# Patient Record
Sex: Female | Born: 1975 | Race: White | Hispanic: No | Marital: Single | Smoking: Never smoker
Health system: Southern US, Community
[De-identification: ages and names within clinical notes are randomized; demographics above are authoritative.]

## PROBLEM LIST (undated history)

## (undated) DIAGNOSIS — R161 Splenomegaly, not elsewhere classified: Secondary | ICD-10-CM

## (undated) DIAGNOSIS — J45909 Unspecified asthma, uncomplicated: Secondary | ICD-10-CM

## (undated) DIAGNOSIS — B259 Cytomegaloviral disease, unspecified: Secondary | ICD-10-CM

## (undated) HISTORY — DX: Splenomegaly, not elsewhere classified: R16.1

## (undated) HISTORY — PX: DILATION AND CURETTAGE OF UTERUS: SHX78

## (undated) HISTORY — DX: Unspecified asthma, uncomplicated: J45.909

## (undated) HISTORY — PX: FOOT SURGERY: SHX648

## (undated) HISTORY — DX: Cytomegaloviral disease, unspecified: B25.9

## (undated) HISTORY — PX: OTHER SURGICAL HISTORY: SHX169

---

## 2007-04-22 ENCOUNTER — Emergency Department (HOSPITAL_COMMUNITY): Admission: EM | Admit: 2007-04-22 | Discharge: 2007-04-22 | Payer: Self-pay | Admitting: Family Medicine

## 2009-03-20 ENCOUNTER — Ambulatory Visit (HOSPITAL_COMMUNITY): Admission: RE | Admit: 2009-03-20 | Discharge: 2009-03-20 | Payer: Self-pay | Admitting: Obstetrics and Gynecology

## 2009-04-15 ENCOUNTER — Emergency Department (HOSPITAL_COMMUNITY): Admission: EM | Admit: 2009-04-15 | Discharge: 2009-04-15 | Payer: Self-pay | Admitting: Emergency Medicine

## 2016-11-12 ENCOUNTER — Encounter: Payer: Self-pay | Admitting: Family Medicine

## 2016-11-12 ENCOUNTER — Ambulatory Visit (INDEPENDENT_AMBULATORY_CARE_PROVIDER_SITE_OTHER): Payer: Managed Care, Other (non HMO) | Admitting: Family Medicine

## 2016-11-12 VITALS — BP 120/80 | HR 76 | Temp 98.3°F | Resp 16 | Wt 156.6 lb

## 2016-11-12 DIAGNOSIS — J4520 Mild intermittent asthma, uncomplicated: Secondary | ICD-10-CM | POA: Diagnosis not present

## 2016-11-12 DIAGNOSIS — R05 Cough: Secondary | ICD-10-CM

## 2016-11-12 DIAGNOSIS — J45909 Unspecified asthma, uncomplicated: Secondary | ICD-10-CM | POA: Insufficient documentation

## 2016-11-12 DIAGNOSIS — Z7689 Persons encountering health services in other specified circumstances: Secondary | ICD-10-CM

## 2016-11-12 DIAGNOSIS — R059 Cough, unspecified: Secondary | ICD-10-CM

## 2016-11-12 MED ORDER — AZITHROMYCIN 250 MG PO TABS: ORAL_TABLET | ORAL | 0 refills | Status: DC

## 2016-11-12 NOTE — Progress Notes (Signed)
   Subjective:    Patient ID: Ariel Ayala, female    DOB: 01-21-1976, 40 y.o.   MRN: [id number]  HPI Chief Complaint  Patient presents with  . sick    cough, congestion- ear fullness, using inhaler. daughter was sick from cruise    She is new to the practice and here with complaints of a 5 day history of rhinorrhea, nasal congestion, ear fullness, and dry cough. Reports yesterday she was worse with wheezing. History of asthma and was using albuterol yesterday. Asthma is well controlled typically. Denies smoking. No history of pneumonia, chronic bronchitis.  Daughter also sick with URI.  Recent cruise to [place] and returned 1 day prior to onset of symptoms.   Denies fever, chills, body aches, fatigue, sore throat, wheezing, chest pain, palpitations, abdominal pain, N/V/D. No LE edema.   Is taking sudafed and albuterol inhaler.  Did not get a flu shot.   Other providers: OB/GYN- Landis MartinsKaren Curtis.   E-sure in 2010.  Single. One child.   Works as a Teacher, English as a foreign languageresearch manager.   Reviewed allergies, medications, past medical, surgical, and social history.     Review of Systems Pertinent positives and negatives in the history of present illness.     Objective:   Physical Exam  Constitutional: She is oriented to person, place, and time. She appears well-developed and well-nourished. No distress.  HENT:  Right Ear: A middle ear effusion is present.  Left Ear: Ear canal normal. A middle ear effusion is present.  Nose: Mucosal edema and rhinorrhea present. Right sinus exhibits no maxillary sinus tenderness and no frontal sinus tenderness. Left sinus exhibits no maxillary sinus tenderness and no frontal sinus tenderness.  Mouth/Throat: Uvula is midline, oropharynx is clear and moist and mucous membranes are normal.  Eyes: Conjunctivae are normal. Pupils are equal, round, and reactive to light.  Neck: Full passive range of motion without pain. Neck supple.  Cardiovascular: Normal rate, regular  rhythm and normal heart sounds.   Pulmonary/Chest: Effort normal and breath sounds normal. No accessory muscle usage. She has no wheezes.  Lymphadenopathy:    She has no cervical adenopathy.  Neurological: She is alert and oriented to person, place, and time.  Skin: Skin is warm and dry. No pallor.  Psychiatric: She has a normal mood and affect. Her speech is normal and behavior is normal. Thought content normal.   BP 120/80   Pulse 76   Temp 98.3 F (36.8 C) (Oral)   Resp 16   Wt 156 lb 9.6 oz (71 kg)   LMP 10/27/2016   SpO2 97%       Assessment & Plan:  Cough  Mild intermittent asthma without complication  Encounter to establish care  Discussed that her symptoms are most likely related to viral etiology and recommend symptomatic treatment such as staying hydrated, mucinex DM or Robitussin DM.  Z-Pak prescribed and advised her to give her symptoms 2-3 more days to see if she turns the corner on her own. If she worsens or is not improving she will start the antibiotic. She will follow up if not back to baseline after day 10 if she takes the antibiotic.

## 2016-11-15 ENCOUNTER — Telehealth: Payer: Self-pay | Admitting: Family Medicine

## 2016-11-15 NOTE — Telephone Encounter (Signed)
Pt had not started zpak yet. Advised to start that as its for coughing and wheezing and call us back on tuesdya if not better

## 2016-11-15 NOTE — Telephone Encounter (Signed)
Please call patient  Patient not feeling better, told to call if Rx needed Using inhaler a lot more, wheezing , coughing    [facility]

## 2016-11-15 NOTE — Telephone Encounter (Signed)
Please let her know that I sent the antibiotic prescription to her pharmacy.

## 2017-04-10 ENCOUNTER — Encounter: Payer: Self-pay | Admitting: Family Medicine

## 2017-04-10 ENCOUNTER — Other Ambulatory Visit: Payer: Self-pay | Admitting: Family Medicine

## 2017-04-10 ENCOUNTER — Ambulatory Visit (INDEPENDENT_AMBULATORY_CARE_PROVIDER_SITE_OTHER): Payer: Managed Care, Other (non HMO) | Admitting: Family Medicine

## 2017-04-10 VITALS — BP 110/70 | HR 92 | Temp 98.8°F | Wt 158.2 lb

## 2017-04-10 DIAGNOSIS — J4520 Mild intermittent asthma, uncomplicated: Secondary | ICD-10-CM | POA: Diagnosis not present

## 2017-04-10 DIAGNOSIS — R140 Abdominal distension (gaseous): Secondary | ICD-10-CM | POA: Diagnosis not present

## 2017-04-10 DIAGNOSIS — R52 Pain, unspecified: Secondary | ICD-10-CM | POA: Diagnosis not present

## 2017-04-10 LAB — CBC WITH DIFFERENTIAL/PLATELET
BASOS ABS: 222 {cells}/uL — AB (ref 0–200)
Basophils Relative: 3 %
EOS ABS: 148 {cells}/uL (ref 15–500)
EOS PCT: 2 %
HEMATOCRIT: 37.2 % (ref 35.0–45.0)
HEMOGLOBIN: 12.3 g/dL (ref 11.7–15.5)
LYMPHS ABS: 3108 {cells}/uL (ref 850–3900)
Lymphocytes Relative: 42 %
MCH: 27.2 pg (ref 27.0–33.0)
MCHC: 33.1 g/dL (ref 32.0–36.0)
MCV: 82.1 fL (ref 80.0–100.0)
MONO ABS: 666 {cells}/uL (ref 200–950)
MPV: 10.3 fL (ref 7.5–12.5)
Monocytes Relative: 9 %
NEUTROS ABS: 3256 {cells}/uL (ref 1500–7800)
Neutrophils Relative %: 44 %
Platelets: 250 10*3/uL (ref 140–400)
RBC: 4.53 MIL/uL (ref 3.80–5.10)
RDW: 14.1 % (ref 11.0–15.0)
WBC: 7.4 10*3/uL (ref 4.0–10.5)

## 2017-04-10 LAB — POCT URINALYSIS DIPSTICK
Bilirubin, UA: NEGATIVE
Glucose, UA: NEGATIVE
Ketones, UA: NEGATIVE
Leukocytes, UA: NEGATIVE
Nitrite, UA: NEGATIVE
SPEC GRAV UA: 1.01 (ref 1.010–1.025)
UROBILINOGEN UA: NEGATIVE U/dL — AB
pH, UA: 5 (ref 5.0–8.0)

## 2017-04-10 LAB — COMPREHENSIVE METABOLIC PANEL
ALBUMIN: 3.7 g/dL (ref 3.6–5.1)
ALT: 37 U/L — ABNORMAL HIGH (ref 6–29)
AST: 42 U/L — ABNORMAL HIGH (ref 10–30)
Alkaline Phosphatase: 101 U/L (ref 33–115)
BILIRUBIN TOTAL: 0.8 mg/dL (ref 0.2–1.2)
BUN: 8 mg/dL (ref 7–25)
CALCIUM: 8.6 mg/dL (ref 8.6–10.2)
CHLORIDE: 99 mmol/L (ref 98–110)
CO2: 25 mmol/L (ref 20–31)
CREATININE: 0.84 mg/dL (ref 0.50–1.10)
Glucose, Bld: 103 mg/dL — ABNORMAL HIGH (ref 65–99)
Potassium: 3.7 mmol/L (ref 3.5–5.3)
SODIUM: 138 mmol/L (ref 135–146)
TOTAL PROTEIN: 6.7 g/dL (ref 6.1–8.1)

## 2017-04-10 MED ORDER — ALBUTEROL SULFATE HFA 108 (90 BASE) MCG/ACT IN AERS: 2.0000 | INHALATION_SPRAY | Freq: Four times a day (QID) | RESPIRATORY_TRACT | 1 refills | Status: DC | PRN

## 2017-04-10 NOTE — Progress Notes (Signed)
Subjective:    Patient ID: Ariel Ayala, female    DOB: 28-Sep-1976, 41 y.o.   MRN: [id number]  HPI Chief Complaint  Patient presents with  . not feeling well    last week, felt bloated, gassy, got gas x and did fine but still bloated and appetite hasn't come back. body temperature is off.   She is here with complaints of a 10 day history of her abdomen feeling bloated and gassy. Reports mainly belching and not flatulence. States symptoms are gradually improving.  She also reports body aches for the past week with some fluctuations in her body temperature but no fever. Complains of decreased appetite but denies weight loss. States she is eating small amounts of food and this does not make her symptoms better or worse. She does recall having similar issue in the past but it spontaneously resolved.  States she tried Gas-X and ginger tea with some relief.    Reports having bowel movements every other day and no changes in bowel habits.  No blood or pus in stool.  No personal or family history of colon cancer or UC.   She is a vegetarian   Drinks soda and carbonated beverages and has cut back to 2-3 per day.   States her body aches, specifically her low back pain feels "achy".  Denies rash, dizziness, chest pain, palpitations, indigestion, cough, shortness of breath, abdominal pain, nausea, vomiting, urinary symptoms, vaginal discharge or dyspareunia.   No daily medications.  Needs albuterol inhaler refilled. Has seasonal allergies and uses her inhaler once a month usually. Asthma well controlled.   No recent surgery, antibiotics or travel.   LMP: 2 weeks ago.  Had E-sure in 2010   Reviewed allergies, medications, past medical, surgical, family, and social history.    Review of Systems Pertinent positives and negatives in the history of present illness.     Objective:   Physical Exam  Constitutional: She is oriented to person, place, and time. She appears well-developed and  well-nourished. She does not have a sickly appearance. No distress.  HENT:  Mouth/Throat: Uvula is midline, oropharynx is clear and moist and mucous membranes are normal.  Eyes: Conjunctivae and lids are normal. Pupils are equal, round, and reactive to light.  Neck: Full passive range of motion without pain. Neck supple. No tracheal tenderness present.  Cardiovascular: Normal rate, regular rhythm and normal heart sounds.   Pulmonary/Chest: Effort normal and breath sounds normal.  Abdominal: Soft. Normal appearance and bowel sounds are normal. There is no hepatosplenomegaly. There is no tenderness. There is no rigidity, no rebound, no guarding, no CVA tenderness, no tenderness at McBurney's point and negative Murphy's sign.  Lymphadenopathy:    She has no cervical adenopathy.  Neurological: She is oriented to person, place, and time. She has normal strength. Gait normal.  Skin: Skin is warm and dry. No rash noted. She is not diaphoretic. No pallor.  Psychiatric: She has a normal mood and affect. Her speech is normal and behavior is normal. Thought content normal.   BP 110/70   Pulse 92   Temp 98.8 F (37.1 C) (Oral)   Wt 158 lb 3.2 oz (71.8 kg)   SpO2 97%       Assessment & Plan:  Abdominal bloating - Plan: CBC with Differential/Platelet, Comprehensive metabolic panel, POCT urinalysis dipstick  Body aches  Mild intermittent asthma without complication  Discussed that there is no sign of infection, her exam is unremarkable and her symptoms seem  to be improving. This all speaks to this not being anything serious. Advised her to try cutting back on foods that are gas producing and keep a journal of her foods and symptoms. Handout given on Low FODMAP diet.  She may try Beano or simethicone.  She will let me know if she notices any new symptoms and discussed that sometimes if one new symptoms develops then this may help us figure out the underlying etiology.  Refilled albuterol inhaler,  asthma is well controlled.  Will have her follow up in 2-3 weeks or sooner if needed.

## 2017-04-10 NOTE — Patient Instructions (Signed)
Try paying close attention to your diet and see if certain foods make your symptoms worse or better.  If you develop new symptoms such as fever, abdominal pain, constipation or diarrhea then let us know.   You can try Beano or Simethicone over the counter and see if this helps. Cut back on high gas producing foods such as cabbage, beans You may also want to see if avoiding dairy or gluten makes a difference.   Let's follow up in 2 weeks and see how you are doing or sooner if needed.

## 2017-04-11 ENCOUNTER — Other Ambulatory Visit: Payer: Self-pay | Admitting: Family Medicine

## 2017-04-11 DIAGNOSIS — R945 Abnormal results of liver function studies: Principal | ICD-10-CM

## 2017-04-11 DIAGNOSIS — R7989 Other specified abnormal findings of blood chemistry: Secondary | ICD-10-CM

## 2017-04-12 LAB — HEPATITIS PANEL, ACUTE
HCV AB: NEGATIVE
HEP A IGM: NONREACTIVE
HEP B C IGM: NONREACTIVE
HEP B S AG: NEGATIVE

## 2017-04-13 ENCOUNTER — Emergency Department (HOSPITAL_COMMUNITY)
Admission: EM | Admit: 2017-04-13 | Discharge: 2017-04-13 | Disposition: A | Discharge status: Home / Self Care | Payer: Managed Care, Other (non HMO) | Attending: Emergency Medicine | Admitting: Emergency Medicine

## 2017-04-13 ENCOUNTER — Emergency Department (HOSPITAL_COMMUNITY): Payer: Managed Care, Other (non HMO)

## 2017-04-13 ENCOUNTER — Encounter (HOSPITAL_COMMUNITY): Payer: Self-pay

## 2017-04-13 DIAGNOSIS — R109 Unspecified abdominal pain: Secondary | ICD-10-CM | POA: Diagnosis present

## 2017-04-13 DIAGNOSIS — J45909 Unspecified asthma, uncomplicated: Secondary | ICD-10-CM | POA: Diagnosis not present

## 2017-04-13 DIAGNOSIS — R161 Splenomegaly, not elsewhere classified: Secondary | ICD-10-CM | POA: Insufficient documentation

## 2017-04-13 LAB — COMPREHENSIVE METABOLIC PANEL
ALBUMIN: 3.2 g/dL — AB (ref 3.5–5.0)
ALK PHOS: 102 U/L (ref 38–126)
ALT: 40 U/L (ref 14–54)
AST: 58 U/L — AB (ref 15–41)
Anion gap: 11 (ref 5–15)
BILIRUBIN TOTAL: 1 mg/dL (ref 0.3–1.2)
BUN: 7 mg/dL (ref 6–20)
CALCIUM: 8.4 mg/dL — AB (ref 8.9–10.3)
CO2: 24 mmol/L (ref 22–32)
Chloride: 100 mmol/L — ABNORMAL LOW (ref 101–111)
Creatinine, Ser: 0.98 mg/dL (ref 0.44–1.00)
GFR calc Af Amer: >60 mL/min (ref >60)
GFR calc non Af Amer: >60 mL/min (ref >60)
GLUCOSE: 119 mg/dL — AB (ref 65–99)
Potassium: 3.5 mmol/L (ref 3.5–5.1)
Sodium: 135 mmol/L (ref 135–145)
TOTAL PROTEIN: 6.9 g/dL (ref 6.5–8.1)

## 2017-04-13 LAB — URINALYSIS, ROUTINE W REFLEX MICROSCOPIC
Bilirubin Urine: NEGATIVE
GLUCOSE, UA: NEGATIVE mg/dL
HGB URINE DIPSTICK: NEGATIVE
Ketones, ur: NEGATIVE mg/dL
NITRITE: NEGATIVE
Protein, ur: 100 mg/dL — AB
RBC / HPF: NONE SEEN RBC/hpf (ref 0–5)
SPECIFIC GRAVITY, URINE: 1.018 (ref 1.005–1.030)
pH: 5 (ref 5.0–8.0)

## 2017-04-13 LAB — CBC
HCT: 37.8 % (ref 36.0–46.0)
Hemoglobin: 12.5 g/dL (ref 12.0–15.0)
MCH: 27.4 pg (ref 26.0–34.0)
MCHC: 33.1 g/dL (ref 30.0–36.0)
MCV: 82.7 fL (ref 78.0–100.0)
Platelets: 209 10*3/uL (ref 150–400)
RBC: 4.57 MIL/uL (ref 3.87–5.11)
RDW: 13.8 % (ref 11.5–15.5)
WBC: 13.4 10*3/uL — ABNORMAL HIGH (ref 4.0–10.5)

## 2017-04-13 LAB — POC URINE PREG, ED: PREG TEST UR: NEGATIVE

## 2017-04-13 LAB — LIPASE, BLOOD: Lipase: 19 U/L (ref 11–51)

## 2017-04-13 LAB — I-STAT CG4 LACTIC ACID, ED: LACTIC ACID, VENOUS: 1.41 mmol/L (ref 0.5–1.9)

## 2017-04-13 LAB — I-STAT BETA HCG BLOOD, ED (MC, WL, AP ONLY)

## 2017-04-13 MED ORDER — IOPAMIDOL (ISOVUE-300) INJECTION 61%
INTRAVENOUS | Status: AC
  Administered 2017-04-13: 100 mL
  Filled 2017-04-13: qty 100

## 2017-04-13 MED ORDER — SODIUM CHLORIDE 0.9 % IV BOLUS (SEPSIS)
1000.0000 mL | Freq: Once | INTRAVENOUS | Status: AC
  Administered 2017-04-13: 1000 mL via INTRAVENOUS

## 2017-04-13 NOTE — ED Triage Notes (Signed)
Patient complains of 2 weeks of generalized abdominal fullness and increased gas with no appetite. States if she eats or drinks causes diarrhea. Also complains of feeling hot and general weakbess and fatigue. Was seen by her MD this week and had elevated liver enzymes. No nausea, no vomiting. Alert and oriented

## 2017-04-13 NOTE — ED Provider Notes (Signed)
[facility]ENCY DEPT Provider Note   CSN: [id number] Arrival date & time: 04/13/17  0800     History   Chief Complaint Chief Complaint  Patient presents with  . Abdominal Pain  . Weakness    HPI Ariel Ayala is a 41 y.o. female.  HPI  41 year old female presents with abdominal pain for 2 weeks. She she states she has decreased appetite and nothing tastes well. Has lost 8 pounds in 2 weeks. Has had subjective fever and chills. She things her fever is been low-grade. No vomiting. Whenever she eats the next day she'll have a loose bowel movement but otherwise no diarrhea. No blood in her stool. No chest pain but has had a "raspy cough" for the last few days. No recent traveling. No urinary or vaginal symptoms. Saw her PCP 3 days ago and had labs drawn were she was told her liver enzymes were abnormal (AST/ALT - 42,37). She has an ultrasound scheduled in the future. The abdominal pain is not worsening but it is not getting better. She has not noticed distention but feels full. She's had some burping but no lower gas.  Past Medical History:  Diagnosis Date  . Asthma     Patient Active Problem List   Diagnosis Date Noted  . Asthma 11/12/2016    Past Surgical History:  Procedure Laterality Date  . DILATION AND CURETTAGE OF UTERUS    . FOOT SURGERY Right    with hardware    OB History    No data available       Home Medications    Prior to Admission medications   Medication Sig Start Date End Date Taking? Authorizing Provider  albuterol (PROVENTIL HFA;VENTOLIN HFA) 108 (90 Base) MCG/ACT inhaler Inhale 2 puffs into the lungs every 6 (six) hours as needed for wheezing or shortness of breath. 04/10/17   Avanell Shackleton, NP-C    Family History Family History  Problem Relation Age of Onset  . Diabetes Mother   . Thyroid disease Mother   . Suicidality Father   . Colon cancer Cousin     Social History Social History  Substance Use Topics  . Smoking status: Never  Smoker  . Smokeless tobacco: Never Used  . Alcohol use No     Allergies   Sulfa antibiotics   Review of Systems Review of Systems  Constitutional: Positive for chills and fever.  Respiratory: Positive for cough. Negative for shortness of breath.   Cardiovascular: Negative for chest pain.  Gastrointestinal: Positive for abdominal pain and diarrhea. Negative for abdominal distention, blood in stool and vomiting.  Genitourinary: Negative for dysuria.  Musculoskeletal: Positive for myalgias. Negative for back pain.  All other systems reviewed and are negative.    Physical Exam Updated Vital Signs BP 99/69   Pulse 96   Temp 99.6 F (37.6 C) (Oral)   Resp 18   Ht 5\' 1"  (1.549 m)   Wt 71.2 kg (157 lb)   LMP 03/20/2017 (Approximate)   SpO2 96%   BMI 29.66 kg/m   Physical Exam  Constitutional: She is oriented to person, place, and time. She appears well-developed and well-nourished. No distress.  HENT:  Head: Normocephalic and atraumatic.  Right Ear: External ear normal.  Left Ear: External ear normal.  Nose: Nose normal.  Eyes: Right eye exhibits no discharge. Left eye exhibits no discharge.  Cardiovascular: Regular rhythm and normal heart sounds.  Tachycardia present.   HR~105  Pulmonary/Chest: Effort normal and breath sounds normal.  Abdominal: Soft. She exhibits no distension. There is tenderness (mild tenderness diffusely). There is no rebound and no guarding.  Neurological: She is alert and oriented to person, place, and time.  Skin: Skin is warm and dry. She is not diaphoretic.  Nursing note and vitals reviewed.    ED Treatments / Results  Labs (all labs ordered are listed, but only abnormal results are displayed) Labs Reviewed  COMPREHENSIVE METABOLIC PANEL - Abnormal; Notable for the following:       Result Value   Chloride 100 (*)    Glucose, Bld 119 (*)    Calcium 8.4 (*)    Albumin 3.2 (*)    AST 58 (*)    All other components within normal limits    CBC - Abnormal; Notable for the following:    WBC 13.4 (*)    All other components within normal limits  URINALYSIS, ROUTINE W REFLEX MICROSCOPIC - Abnormal; Notable for the following:    Color, Urine AMBER (*)    APPearance CLOUDY (*)    Protein, ur 100 (*)    Leukocytes, UA SMALL (*)    Bacteria, UA RARE (*)    Squamous Epithelial / LPF TOO NUMEROUS TO COUNT (*)    All other components within normal limits  LIPASE, BLOOD  DIFFERENTIAL  I-STAT BETA HCG BLOOD, ED ([facility], [facility], [facility] ONLY)  I-STAT CG4 LACTIC ACID, ED  POC URINE PREG, ED    EKG  EKG Interpretation None       Radiology Dg Chest 2 View  Result Date: 04/13/2017 CLINICAL DATA:  Dry cough, low-grade fever EXAM: CHEST  2 VIEW COMPARISON:  None. FINDINGS: Lungs are clear.  No pleural effusion or pneumothorax. The heart is normal in size. Visualized osseous structures are within normal limits. IMPRESSION: Normal chest radiographs. Electronically Signed   By: Charline BillsSriyesh  Krishnan M.D.   On: 04/13/2017 10:06   Ct Abdomen Pelvis W Contrast  Result Date: 04/13/2017 CLINICAL DATA:  Abdominal pain and weight loss. EXAM: CT ABDOMEN AND PELVIS WITH CONTRAST TECHNIQUE: Multidetector CT imaging of the abdomen and pelvis was performed using the standard protocol following bolus administration of intravenous contrast. CONTRAST:  100mL ISOVUE-300 IOPAMIDOL (ISOVUE-300) INJECTION 61% COMPARISON:  None. FINDINGS: Lower chest: Limited visualization of the lower thorax demonstrates minimal dependent subpleural ground-glass atelectasis. No focal airspace opacities. Normal heart size.  No pericardial effusion. Hepatobiliary: Normal hepatic contour. There is a minimal amount of focal fatty infiltration adjacent to the fissure for ligamentum teres. No discrete hepatic lesions. Normal appearance of the gallbladder given degree distention. Note is made of a phrygian cap. No radiopaque gallstones. No intra or extrahepatic biliary ductal dilatation. No ascites.  Pancreas: Normal appearance of the pancreas Spleen: The spleen is enlarged measuring 17.5 cm in greatest oblique sagittal diameter (sagittal image 83, series 7). Wedge-shaped hypoattenuating lesions within in the caudal aspect of the spleen are likely perfusional etiology. Note is made of a small splenule. Adrenals/Urinary Tract: There is symmetric enhancement and excretion of the bilateral kidneys. No definite renal stones in this postcontrast examination. No discrete renal lesions. No urine obstruction. Normal appearance of the bilateral adrenal glands. Normal appearance of the urinary bladder given degree distention. Stomach/Bowel: The bowel is normal in course and caliber without wall thickening or evidence of enteric obstruction. Normal appearance of the terminal ileum and appendix. No pneumoperitoneum, pneumatosis or portal venous gas. Vascular/Lymphatic: Minimal amount of eccentric mixed calcified and noncalcified atherosclerotic plaque within the right common iliac artery. The abdominal  aorta is of normal caliber. The major branch vessels of the abdominal aorta appear widely patent on this non CTA examination. No bulky retroperitoneal, mesenteric, pelvic or inguinal lymphadenopathy. Reproductive: Note is made of an approximately 2.2 x 1.8 cm hypoattenuating right-sided presumably physiologic adnexal cyst. Post bilateral tubal ligation. There is a small amount of presumably physiologic free fluid in the pelvic cul-de-sac (image 75, series 3). Other: Regional soft tissues appear normal. Musculoskeletal: No acute or aggressive osseous abnormalities. IMPRESSION: Splenomegaly of an indeterminate etiology. Otherwise, no explanation for patient's abdominal pain and weight loss. Electronically Signed   By: Simonne Come M.D.   On: 04/13/2017 11:08    Procedures Procedures (including critical care time)  Medications Ordered in ED Medications  sodium chloride 0.9 % bolus 1,000 mL (0 mLs Intravenous Stopped  04/13/17 1120)  iopamidol (ISOVUE-300) 61 % injection (100 mLs  Contrast Given 04/13/17 1036)     Initial Impression / Assessment and Plan / ED Course  I have reviewed the triage vital signs and the nursing notes.  Pertinent labs & imaging results that were available during my care of the patient were reviewed by me and considered in my medical decision making (see chart for details).     CT scan shows splenomegaly. Patient denies any recent illness such as sore throat or otherwise mouth syndrome. She has mild cough. No bronchospasm. I discussed the CT scan with Dr. Grace Isaac of radiology, the hypoattenuating area on the spleen is of unclear etiology. It does not look like a typical ischemic area. She does not have any focal tenderness over her spleen. I discussed with oncology, Dr. Arbutus Ped, who states she needs a workup for this but not emergently, can be seen in a couple weeks. Will have patient follow-up with PCP. Discussed precautions such as no contact sports or to have low suspicion to come in to the hospital if she develops left upper quadrant pain or an injury to her left abdomen. Follow-up with PCP.  Final Clinical Impressions(s) / ED Diagnoses   Final diagnoses:  Splenomegaly    New Prescriptions Discharge Medication List as of 04/13/2017 12:26 PM       Pricilla Loveless, MD 04/13/17 1554

## 2017-04-13 NOTE — ED Notes (Signed)
Pt verbalized understanding discharge instructions and denies any further needs or questions at this time. VS stable, ambulatory and steady gait.   

## 2017-04-16 ENCOUNTER — Telehealth: Payer: Self-pay | Admitting: Family Medicine

## 2017-04-16 ENCOUNTER — Encounter: Payer: Self-pay | Admitting: Physician Assistant

## 2017-04-16 ENCOUNTER — Ambulatory Visit (INDEPENDENT_AMBULATORY_CARE_PROVIDER_SITE_OTHER): Payer: Managed Care, Other (non HMO) | Admitting: Family Medicine

## 2017-04-16 ENCOUNTER — Encounter: Payer: Self-pay | Admitting: Family Medicine

## 2017-04-16 VITALS — BP 120/82 | HR 103 | Temp 98.6°F | Resp 16 | Wt 156.8 lb

## 2017-04-16 DIAGNOSIS — R5383 Other fatigue: Secondary | ICD-10-CM | POA: Diagnosis not present

## 2017-04-16 DIAGNOSIS — R6881 Early satiety: Secondary | ICD-10-CM | POA: Diagnosis not present

## 2017-04-16 DIAGNOSIS — R140 Abdominal distension (gaseous): Secondary | ICD-10-CM

## 2017-04-16 DIAGNOSIS — R161 Splenomegaly, not elsewhere classified: Secondary | ICD-10-CM

## 2017-04-16 DIAGNOSIS — R634 Abnormal weight loss: Secondary | ICD-10-CM | POA: Diagnosis not present

## 2017-04-16 LAB — CBC WITH DIFFERENTIAL/PLATELET
BASOS PCT: 4 %
Basophils Absolute: 676 cells/uL — ABNORMAL HIGH (ref 0–200)
EOS ABS: 169 {cells}/uL (ref 15–500)
EOS PCT: 1 %
HCT: 38.2 % (ref 35.0–45.0)
Hemoglobin: 12.6 g/dL (ref 11.7–15.5)
LYMPHS PCT: 65 %
Lymphs Abs: 10985 cells/uL — ABNORMAL HIGH (ref 850–3900)
MCH: 27.2 pg (ref 27.0–33.0)
MCHC: 33 g/dL (ref 32.0–36.0)
MCV: 82.5 fL (ref 80.0–100.0)
MONOS PCT: 10 %
MPV: 10.5 fL (ref 7.5–12.5)
Monocytes Absolute: 1690 cells/uL — ABNORMAL HIGH (ref 200–950)
NEUTROS ABS: 3380 {cells}/uL (ref 1500–7800)
Neutrophils Relative %: 20 %
PLATELETS: 271 10*3/uL (ref 140–400)
RBC: 4.63 MIL/uL (ref 3.80–5.10)
RDW: 14.9 % (ref 11.0–15.0)
WBC: 16.9 10*3/uL — AB (ref 4.0–10.5)

## 2017-04-16 LAB — POCT MONO (EPSTEIN BARR VIRUS): Mono, POC: NEGATIVE

## 2017-04-16 MED ORDER — ESOMEPRAZOLE MAGNESIUM 20 MG PO CPDR: 20.0000 mg | DELAYED_RELEASE_CAPSULE | Freq: Every day | ORAL | 1 refills | Status: DC

## 2017-04-16 NOTE — Progress Notes (Signed)
Subjective:    Patient ID: Ariel Ayala, female    DOB: 19-Sep-1976, 41 y.o.   MRN: [id number]  HPI Chief Complaint  Patient presents with  . ER follow-up   She is here to follow up on fatigue, abdominal bloating, early satiety and recent diagnosis of splenomegaly on CT scan while in the ED. States she was told in the ED to follow up with Dr. Arbutus PedMohamed, oncology/hemotology for enlarged spleen.  She has had a 2 week history of abdominal bloating, reflux, belching, poor appetite and reportedly a 10 lb weight loss.  Has also noticed some bowel changes, has been having much looser stools. No blood or pus.    Does not drink, smoke or use drugs.   Denies fever or chills but has noticed some fluctuation in body temperature.  No arthralgias or myalgias. No rash.  Denies sore throat, dizziness, vision changes, headache, confusion, chest pain, palpitations, shortness of breath, orthopnea, cough, vomiting, diarrhea, urinary symptoms or vaginal discharge. No LE edema.   She reports a healthy diet and is a vegetarian. Has a boyfriend. E-sure for contraception.   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   Reviewed allergies, medications, past medical, surgical, family, and social history.    Review of Systems Pertinent positives and negatives in the history of present illness.     Objective:   Physical Exam  Constitutional: She is oriented to person, place, and time. She appears well-developed and well-nourished. She does not have a sickly appearance. No distress.  HENT:  Nose: Nose normal.  Mouth/Throat: Uvula is midline, oropharynx is clear and moist and mucous membranes are normal.  Eyes: Conjunctivae, EOM and lids are normal. Pupils are equal, round, and reactive to light.  Neck: Normal range of motion. Neck supple. No JVD present. No thyromegaly present.  Cardiovascular: Normal rate, regular rhythm, normal heart sounds and intact distal pulses.  Exam reveals no gallop and no  friction rub.   No murmur heard. No LE edema   Pulmonary/Chest: Effort normal and breath sounds normal.  Abdominal: Soft. Bowel sounds are normal. She exhibits no distension, no ascites and no mass. There is no tenderness. There is no rigidity, no rebound, no guarding, no CVA tenderness, no tenderness at McBurney's point and negative Murphy's sign.  Lymphadenopathy:    She has no cervical adenopathy.       Right: No supraclavicular adenopathy present.       Left: No supraclavicular adenopathy present.  Neurological: She is alert and oriented to person, place, and time. She has normal strength. No cranial nerve deficit or sensory deficit. Gait normal.  Skin: Skin is warm and dry. No rash noted. No pallor.  Psychiatric: She has a normal mood and affect. Her speech is normal and behavior is normal. Thought content normal. Cognition and memory are normal.   BP 120/82   Pulse (!) 103   Temp 98.6 F (37 C) (Oral)   Resp 16   Wt 156 lb 12.8 oz (71.1 kg)   LMP 03/20/2017 (Approximate)   SpO2 98%   BMI 29.63 kg/m       Assessment & Plan:  Splenomegaly - Plan: CBC with Differential/Platelet, RPR, Sedimentation rate, TSH, HIV antibody, CMV IgM, Ambulatory referral to Hematology  Abdominal bloating - Plan: CBC with Differential/Platelet, Ambulatory referral to Gastroenterology  Early satiety - Plan: Ambulatory referral to Gastroenterology  Unexplained weight loss - Plan: CBC with Differential/Platelet, Ambulatory referral to Gastroenterology, Sedimentation rate, TSH, HIV antibody, CMV  IgM, Ambulatory referral to Hematology  Fatigue, unspecified type - Plan: CBC with Differential/Platelet, RPR, Sedimentation rate, TSH, HIV antibody, CMV IgM, Ambulatory referral to Hematology, POCT Mono Malachi Carl Virus)  POCT mono negative.   Discussed multiple etiologies for fatigue and splenomegaly. Reviewed labs and CT results from the ED.  No old CT or [place] of abdomen to compare.  Plan to refer her  to GI for early satiety, bloating, unexplained weight loss.  Referral to hematology for further evaluation of splenomegaly. Attempted to contact Dr. Arbutus Ped but was unable to get him to the phone. I was told to just make the referral. Discussed protection of her abdomen in order to prevent splenic rupture. She verbalized understanding.  She will follow up if needed prior to getting visits with GI or Heme/oncology.

## 2017-04-16 NOTE — Telephone Encounter (Signed)
Called Pt & she states she doesn't know why she was even prescribed this, said she wasn't given anything at the hospital and hasn't been taking anything and doesn't think she needs anything.

## 2017-04-16 NOTE — Telephone Encounter (Signed)
She can switch to omeprazole. This is over the counter. Does she still want a prescription? She can buy any of those medications over the counter. Thanks.

## 2017-04-16 NOTE — Telephone Encounter (Signed)
[facility] insurance will not pay for esomeprazole request alternative pantoprazole or lansoprazole or rabeprazole, or omeprazole.  Do you want to switch?

## 2017-04-17 LAB — HIV ANTIBODY (ROUTINE TESTING W REFLEX): HIV: NONREACTIVE

## 2017-04-17 LAB — RPR

## 2017-04-17 LAB — SEDIMENTATION RATE: SED RATE: 9 mm/h (ref 0–20)

## 2017-04-17 LAB — TSH: TSH: 4.67 m[IU]/L — AB

## 2017-04-17 LAB — PATHOLOGIST SMEAR REVIEW

## 2017-04-18 LAB — CMV IGM: CMV IgM: >240 AU/mL — ABNORMAL HIGH (ref <30.00)

## 2017-04-21 ENCOUNTER — Other Ambulatory Visit (INDEPENDENT_AMBULATORY_CARE_PROVIDER_SITE_OTHER): Payer: Managed Care, Other (non HMO)

## 2017-04-21 ENCOUNTER — Telehealth: Payer: Self-pay | Admitting: *Deleted

## 2017-04-21 ENCOUNTER — Encounter: Payer: Self-pay | Admitting: Physician Assistant

## 2017-04-21 ENCOUNTER — Ambulatory Visit (INDEPENDENT_AMBULATORY_CARE_PROVIDER_SITE_OTHER): Payer: Managed Care, Other (non HMO) | Admitting: Physician Assistant

## 2017-04-21 VITALS — BP 110/62 | HR 109 | Ht 61.0 in | Wt 156.0 lb

## 2017-04-21 DIAGNOSIS — R161 Splenomegaly, not elsewhere classified: Secondary | ICD-10-CM

## 2017-04-21 DIAGNOSIS — R140 Abdominal distension (gaseous): Secondary | ICD-10-CM | POA: Diagnosis not present

## 2017-04-21 DIAGNOSIS — B259 Cytomegaloviral disease, unspecified: Secondary | ICD-10-CM

## 2017-04-21 LAB — CBC WITH DIFFERENTIAL/PLATELET
Basophils Absolute: 0.1 10*3/uL (ref 0.0–0.1)
Basophils Relative: 0.7 % (ref 0.0–3.0)
EOS PCT: 0.5 % (ref 0.0–5.0)
Eosinophils Absolute: 0.1 10*3/uL (ref 0.0–0.7)
HCT: 39.9 % (ref 36.0–46.0)
Hemoglobin: 13 g/dL (ref 12.0–15.0)
LYMPHS ABS: 14 10*3/uL — AB (ref 0.7–4.0)
Lymphocytes Relative: 71.3 % — ABNORMAL HIGH (ref 12.0–46.0)
MCHC: 32.6 g/dL (ref 30.0–36.0)
MCV: 82.8 fl (ref 78.0–100.0)
Monocytes Absolute: 1.2 10*3/uL — ABNORMAL HIGH (ref 0.1–1.0)
Monocytes Relative: 6.3 % (ref 3.0–12.0)
NEUTROS ABS: 4.2 10*3/uL (ref 1.4–7.7)
NEUTROS PCT: 21.2 % — AB (ref 43.0–77.0)
Platelets: 253 10*3/uL (ref 150.0–400.0)
RBC: 4.81 Mil/uL (ref 3.87–5.11)
RDW: 15.3 % (ref 11.5–15.5)
WBC: 19.6 10*3/uL (ref 4.0–10.5)

## 2017-04-21 LAB — HEPATIC FUNCTION PANEL
ALBUMIN: 3 g/dL — AB (ref 3.5–5.2)
ALT: 36 U/L — ABNORMAL HIGH (ref 0–35)
AST: 63 U/L — AB (ref 0–37)
Alkaline Phosphatase: 88 U/L (ref 39–117)
Bilirubin, Direct: 0.3 mg/dL (ref 0.0–0.3)
Total Bilirubin: 0.8 mg/dL (ref 0.2–1.2)
Total Protein: 6.7 g/dL (ref 6.0–8.3)

## 2017-04-21 MED ORDER — AMBULATORY NON FORMULARY MEDICATION: 0 refills | Status: DC

## 2017-04-21 MED ORDER — FIRST-DUKES MOUTHWASH MT SUSP: OROMUCOSAL | 0 refills | Status: DC

## 2017-04-21 MED ORDER — RANITIDINE HCL 150 MG PO TABS: ORAL_TABLET | ORAL | 1 refills | Status: DC

## 2017-04-21 NOTE — Patient Instructions (Signed)
Please go to the basement level to have your labs drawn.  We have sent the following medications to your pharmacy for you to pick up at your convenience: 1. Zantac 150 mg.  2. Magic Mouth- take 5 cc's, by mouth, swish and spit 4 times daily for 10 days.   We will [facility] you are scheduled for.

## 2017-04-21 NOTE — Telephone Encounter (Signed)
Per Mike GipAmy Esterwood PA, I called and cancelled the Ultrasound that was scheduled by another office.  It is not necessary for the patient to have this at this time.  The patient is aware of the cancellation.  Called [facility] at [phone] and cancelled the Abdominal Ultrasound.

## 2017-04-21 NOTE — Progress Notes (Signed)
Subjective:    Patient ID: Ariel Ayala, female    DOB: January 04, 1976, 41 y.o.   MRN: [id number]  HPI Ariel Ayala a pleasant 41 year old white female generally in good health with no prior GI history. She Ayala referred today by Hetty Blend NP/family medicine for recent complaints of abdominal bloating and early satiety and weight loss of about 10 pounds. Patient had an ER visit on 04/13/2017 with complaints of abdominal discomfort over the 2 previous weeks and subjective fever and chills. It onset of illness she had a raspy cough but no sore throat etc. She had CT of the abdomen and pelvis done on 04/13/2017 that showed normal hepatic contour, she had an enlarged spleen at 17.5 cm and wedge-shaped hypoattenuating lesions within the caudal aspect of the spleen, the major branch vessels were patent, also with a 2.1 x 1.8 cm adnexal cyst. Labs showed a WBC of 16.9 with marked leukocytosis and smears showed absolute lymphocytosis, the RBCs were microcytic. EBV and CMV serologies were done and CMV IgM Ayala positive. LFTs normal with the exception of an a AST of 58. Patient was just informed of the positive CMV late Friday and today Ayala Monday. She says she's been extremely fatigued but trying to work parts of most days. Currently has a decreased appetite and says nothing tastes good. She's not having any nausea or vomiting. She has some vague upper abdominal discomfort. She's also noted a rash over the past few days as her abdomen and on her forearms which has not been pleuritic. She says her abdominal discomfort Ayala like a general stomach ache type feeling with a lot of burping and belching.  Review of Systems Pertinent positive and negative review of systems were noted in the above HPI section.  All other review of systems was otherwise negative.  Outpatient Encounter Prescriptions as of 04/21/2017  Medication Sig  . albuterol (PROVENTIL HFA;VENTOLIN HFA) 108 (90 Base) MCG/ACT inhaler Inhale 2 puffs into the  lungs every 6 (six) hours as needed for wheezing or shortness of breath.  . loratadine (CLARITIN) 10 MG tablet Take 10 mg by mouth daily as needed for allergies.  . Diphenhyd-Hydrocort-Nystatin (FIRST-DUKES MOUTHWASH) SUSP Take by mouth 5 cc's 4 times daily, Swish and Spit. For 10 days.  . ranitidine (ZANTAC) 150 MG tablet Take 1 tablet by mouth twice daily.  . [DISCONTINUED] AMBULATORY NON FORMULARY MEDICATION Medication Name: Magic mouthwash- Nystatin and Hydrocortisone suspension 200 ml's  Take 5 cc's 4 times daily.x 10 days  . [DISCONTINUED] esomeprazole (NEXIUM) 20 MG capsule Take 1 capsule (20 mg total) by mouth daily.   No facility-administered encounter medications on file as of 04/21/2017.    Allergies  Allergen Reactions  . Sulfa Antibiotics Other (See Comments)    Throat swells and gets itchy   Patient Active Problem List   Diagnosis Date Noted  . Splenomegaly 04/16/2017  . Asthma 11/12/2016   Social History   Social History  . Marital status: Single    Spouse name: N/A  . Number of children: N/A  . Years of education: N/A   Occupational History  . Not on file.   Social History Main Topics  . Smoking status: Never Smoker  . Smokeless tobacco: Never Used  . Alcohol use No  . Drug use: No  . Sexual activity: Not on file   Other Topics Concern  . Not on file   Social History Narrative  . No narrative on file    Ariel Ayala's family  history includes Colon cancer in her cousin; Diabetes in her mother; Suicidality in her father; Thyroid disease in her mother.      Objective:    Vitals:   04/21/17 0935  BP: 110/62  Pulse: (!) 109    Physical Exam well-developed fatigued appearing white female in no acute distress, pleasant blood pressure 110/62 pulse 109, height 5 foot 1, weight 156, BMI 29.4. HEENT; nontraumatic normocephalic EOMI PERRLA sclera anicteric, Cardiovascular; regular rate and rhythm with S1-S2 no murmur rub or gallop, Pulmonary; clear  bilaterally, Abdomen; soft, she has some mild tenderness in the epigastrium and left upper quadrant, spleen tip Ayala palpable, palpable hepatomegaly bowel sounds are present, Rectal ;exam not done, Extremities ;no clubbing cyanosis or edema skin warm and dry she does have a fine macular rash across her abdomen and on her forearms, Neuropsych; mood and affect appropriate       Assessment & Plan:   #451  41 year old female with 2-1/2 week history of fatigue, subjective fever and chills, stomach ache, anorexia, weight loss, abdominal bloating and new rash which Ayala nonpruritic. Patient has splenomegaly by CT, marked lymphocytosis and positive CMV IgM. All of her symptoms are consistent with an acute CMV mononucleosis-type picture.  Plan; I do not think patient needs further GI evaluation at this time. We will repeat CBC with differential and hepatic panel today Trial of Dukes magic mouthwash 5 mL swish and spit 4 times daily 7 days Trial of Zantac 150 twice a day 2-3 weeks until symptoms improve. Patient was provided with educational material on acute CMV infection, and we  discussed slow nature of resolution over the next several weeks. She Ayala asked to follow up with her PCP later this week. We are happy to see her back on an as-needed basis.  Amy S Esterwood PA-C 04/21/2017   Cc: Henson, Vickie L, NP-C

## 2017-04-22 ENCOUNTER — Other Ambulatory Visit: Payer: Self-pay

## 2017-04-22 NOTE — Progress Notes (Signed)
Thank you for sending this case to me and for discussing it in clinic yesterday. I have reviewed the entire note, and the outlined plan seems appropriate.   Amada JupiterHenry Danis, MD

## 2017-04-23 ENCOUNTER — Telehealth: Payer: Self-pay | Admitting: *Deleted

## 2017-04-23 ENCOUNTER — Telehealth: Payer: Self-pay | Admitting: Family Medicine

## 2017-04-23 ENCOUNTER — Other Ambulatory Visit: Payer: Self-pay

## 2017-04-23 ENCOUNTER — Telehealth: Payer: Self-pay | Admitting: Physician Assistant

## 2017-04-23 NOTE — Telephone Encounter (Signed)
Advised the patient that I spoke to the pharmacy [facility] [address]  Quincy Carneshey are clear on what ingredients and ratio of the ingredients we wanted in the Duke Mouthwash.  The patient can pick up the script.  She asked about her labs and I told her what Amy had noted about the labs.  I advised her to call us in a week with a progress report.

## 2017-04-23 NOTE — Telephone Encounter (Signed)
Please call and let her know that the hematologist thinks that her enlarged spleen and abnormal labs are most likely related to the CMV, virus. We will cancel her referral for now and have her follow up here in 6-8 weeks. At that point we will consider re-scanning her abdomen. If she is getting worse then I would like for her to come in sooner.

## 2017-04-23 NOTE — Telephone Encounter (Signed)
Spoke to pharmacist, Sharyl NimrodMeredith, at [facility] [address], [place]. Went over the details of the Dukes mouth wash. Advised pharmacist to use the Hydrocortisone 10 mg, and if there are 3 ingredients ( hydrocortisone, Nystatin, diphenhydramine) , to do 33% of each or 1-1-1. Sharyl NimrodMeredith said they can do this and get it ready for the patient.

## 2017-04-24 ENCOUNTER — Telehealth: Payer: Self-pay | Admitting: Internal Medicine

## 2017-04-24 NOTE — Telephone Encounter (Signed)
Spoke to patient about CMV and hematology referral. Pt can follow-up in 6-8 weeks unless she gets worse in the meantime. [facility] has [facility].

## 2017-05-08 ENCOUNTER — Telehealth: Payer: Self-pay | Admitting: Internal Medicine

## 2017-05-08 NOTE — Telephone Encounter (Signed)
I cannot really advise her whether her low back pain is related to her virus or another cause. If she continues having pain or any new symptoms then she may want to come in and have us do an exam.

## 2017-05-08 NOTE — Telephone Encounter (Signed)
Pt was advised and will come in tomorrow if not any better

## 2017-05-08 NOTE — Telephone Encounter (Signed)
Pt called and left a vm stating that she is back pain on more of left side. So I called patient and spoke to her and she is getting over CMV and is 80-90% better than she was but she is having left lower side pain and didn't know if this could be related to the CMV. She is not having urinary symptoms. Please advise

## 2017-05-09 ENCOUNTER — Encounter: Payer: Self-pay | Admitting: Family Medicine

## 2017-05-09 ENCOUNTER — Ambulatory Visit (INDEPENDENT_AMBULATORY_CARE_PROVIDER_SITE_OTHER): Payer: Managed Care, Other (non HMO) | Admitting: Family Medicine

## 2017-05-09 VITALS — BP 110/70 | HR 79 | Temp 98.2°F | Wt 154.0 lb

## 2017-05-09 DIAGNOSIS — R945 Abnormal results of liver function studies: Secondary | ICD-10-CM

## 2017-05-09 DIAGNOSIS — Z8349 Family history of other endocrine, nutritional and metabolic diseases: Secondary | ICD-10-CM

## 2017-05-09 DIAGNOSIS — R7989 Other specified abnormal findings of blood chemistry: Secondary | ICD-10-CM | POA: Diagnosis not present

## 2017-05-09 DIAGNOSIS — L304 Erythema intertrigo: Secondary | ICD-10-CM

## 2017-05-09 DIAGNOSIS — B259 Cytomegaloviral disease, unspecified: Secondary | ICD-10-CM | POA: Insufficient documentation

## 2017-05-09 DIAGNOSIS — R946 Abnormal results of thyroid function studies: Secondary | ICD-10-CM | POA: Diagnosis not present

## 2017-05-09 DIAGNOSIS — M545 Low back pain, unspecified: Secondary | ICD-10-CM

## 2017-05-09 HISTORY — DX: Family history of other endocrine, nutritional and metabolic diseases: Z83.49

## 2017-05-09 LAB — POCT URINALYSIS DIP (PROADVANTAGE DEVICE)
BILIRUBIN UA: NEGATIVE
BILIRUBIN UA: NEGATIVE mg/dL
Glucose, UA: NEGATIVE mg/dL
Nitrite, UA: NEGATIVE
Protein Ur, POC: NEGATIVE mg/dL
RBC UA: NEGATIVE
Specific Gravity, Urine: 1.03
Urobilinogen, Ur: NEGATIVE
pH, UA: 6 (ref 5.0–8.0)

## 2017-05-09 LAB — CBC WITH DIFFERENTIAL/PLATELET
BASOS ABS: 97 {cells}/uL (ref 0–200)
Basophils Relative: 1 %
EOS ABS: 97 {cells}/uL (ref 15–500)
Eosinophils Relative: 1 %
HCT: 38.5 % (ref 35.0–45.0)
Hemoglobin: 12.4 g/dL (ref 11.7–15.5)
LYMPHS PCT: 57 %
Lymphs Abs: 5529 cells/uL — ABNORMAL HIGH (ref 850–3900)
MCH: 26.8 pg — AB (ref 27.0–33.0)
MCHC: 32.2 g/dL (ref 32.0–36.0)
MCV: 83.3 fL (ref 80.0–100.0)
MONOS PCT: 7 %
MPV: 9.5 fL (ref 7.5–12.5)
Monocytes Absolute: 679 cells/uL (ref 200–950)
Neutro Abs: 3298 cells/uL (ref 1500–7800)
Neutrophils Relative %: 34 %
Platelets: 450 10*3/uL — ABNORMAL HIGH (ref 140–400)
RBC: 4.62 MIL/uL (ref 3.80–5.10)
RDW: 15.6 % — AB (ref 11.0–15.0)
WBC: 9.7 10*3/uL (ref 4.0–10.5)

## 2017-05-09 LAB — COMPREHENSIVE METABOLIC PANEL
ALT: 42 U/L — ABNORMAL HIGH (ref 6–29)
AST: 50 U/L — ABNORMAL HIGH (ref 10–30)
Albumin: 3.7 g/dL (ref 3.6–5.1)
Alkaline Phosphatase: 86 U/L (ref 33–115)
BUN: 9 mg/dL (ref 7–25)
CALCIUM: 9.2 mg/dL (ref 8.6–10.2)
CO2: 24 mmol/L (ref 20–31)
Chloride: 103 mmol/L (ref 98–110)
Creat: 0.66 mg/dL (ref 0.50–1.10)
GLUCOSE: 91 mg/dL (ref 65–99)
POTASSIUM: 3.9 mmol/L (ref 3.5–5.3)
Sodium: 139 mmol/L (ref 135–146)
Total Bilirubin: 0.7 mg/dL (ref 0.2–1.2)
Total Protein: 7.2 g/dL (ref 6.1–8.1)

## 2017-05-09 NOTE — Patient Instructions (Signed)
Try Lamisil or another over the counter antifungal cream. Keep the area dry. Let me know if this gets worse or not improving in the next 4 weeks.   For your back, use heat to the area. You can take 800 mg ibuprofen three times daily with food and see if this helps. If your pain worsens or any new symptoms arise then let me know.   We will call you with lab results.

## 2017-05-09 NOTE — Progress Notes (Signed)
Subjective:    Patient ID: Ariel Ayala, female    DOB: 1976/07/10, 41 y.o.   MRN: [id number]  HPI Chief Complaint  Patient presents with  . lower left side pain    lower side pain on both sides. and rash on stomach.    She is here with complaints of low back pain, mainly left lower side for the past 5 days. Pain feels like a dull ache and is constant and worse throughout the day. Pain is nonradiating. Pain is worse with certain movements. No affected by urination. No urinary symptoms.   No known injury. No history of low back problems or surgery.  She has not taken anything for her symptoms.   States she is feeling at least 80-90% improved from her recent CMV diagnosis. She was able to go to [place] and enjoy her vacation. She returned last Saturday which was 2 days prior to onset of back pain.   She also complains of a rash under her bra line for the past 3 days since being in [place]. She also reports itching on her abdomen and back for the past few weeks.    Denies fever, chills, headache, dizziness, sore throat, cough, chest pain, palpitations, abdominal pain, N/V/D.  No numbness, tingling or weakness.    Review of Systems Pertinent positives and negatives in the history of present illness.     Objective:   Physical Exam  Constitutional: She is oriented to person, place, and time. She appears well-developed and well-nourished. No distress.  HENT:  Mouth/Throat: Oropharynx is clear and moist.  Eyes: Conjunctivae are normal. Pupils are equal, round, and reactive to light.  Neck: Normal range of motion. Neck supple. No thyromegaly present.  Cardiovascular: Normal rate, regular rhythm, normal heart sounds and intact distal pulses.   Pulmonary/Chest: Effort normal and breath sounds normal.  Abdominal: Soft. Bowel sounds are normal. She exhibits no distension. There is no hepatosplenomegaly. There is no tenderness. There is no rebound and no CVA tenderness.  Musculoskeletal:         Thoracic back: Normal.       Lumbar back: Normal.  Lymphadenopathy:    She has no cervical adenopathy.  Neurological: She is alert and oriented to person, place, and time. She has normal strength and normal reflexes. No cranial nerve deficit or sensory deficit. Coordination and gait normal.  Negative straight leg raise.   Skin: Skin is warm and dry. No pallor.  Mildly erythematous and moist thin lines under her bilateral breasts. No sign of infection.    BP 110/70   Pulse 79   Temp 98.2 F (36.8 C) (Oral)   Wt 154 lb (69.9 kg)   BMI 29.10 kg/m       Assessment & Plan:  Acute left-sided low back pain without sciatica - Plan: POCT Urinalysis DIP (Proadvantage Device)  Cytomegalovirus infection, unspecified cytomegaloviral infection type (HCC) - Plan: CBC with Differential/Platelet, Comprehensive metabolic panel  Family history of thyroid disease in mother - Plan: TSH, T3, T4, Free  Elevated TSH - Plan: TSH, T3, T4, Free  Elevated LFTs - Plan: Comprehensive metabolic panel  Intertrigo  Discussed that her back pain appears to be related to a musculoskeletal issue and recommend conservative therapy with heat and NSAIDS. She will let me know if not improving or back pain worsens. No sign of UTI. UA is negative.  She will try over the counter topical antifungal for her rash and keep the area dry.  Plan to repeat  labs due to elevated LFTs and TSH.  She reports feeling at least 80% improved since diagnosis of CMV. Will continue to monitor this.  Follow up pending labs.

## 2017-05-10 LAB — TSH: TSH: 2.51 mIU/L

## 2017-05-10 LAB — T4, FREE: FREE T4: 1.1 ng/dL (ref 0.8–1.8)

## 2017-05-10 LAB — T3: T3, Total: 127.5 ng/dL (ref 76–181)

## 2017-05-12 ENCOUNTER — Other Ambulatory Visit: Payer: Self-pay | Admitting: Family Medicine

## 2017-05-12 DIAGNOSIS — R7989 Other specified abnormal findings of blood chemistry: Secondary | ICD-10-CM

## 2017-05-12 DIAGNOSIS — R945 Abnormal results of liver function studies: Principal | ICD-10-CM

## 2017-05-15 ENCOUNTER — Telehealth: Payer: Self-pay | Admitting: Internal Medicine

## 2017-05-15 NOTE — Telephone Encounter (Signed)
Pt called and left vm stating that Lamasil is not doing anything and she also has a yeast infection. When I called patient back. I advised her lamasil takes a couple weeks to get in system and to keep using it.  We were going to send in something for yeast infection but pt contacted her obgyn and they sent it in already, so I advised pt we would not send anything in right now since obgyn did it.

## 2017-08-19 NOTE — Telephone Encounter (Signed)
Done

## 2018-01-20 ENCOUNTER — Telehealth: Payer: Self-pay | Admitting: Internal Medicine

## 2018-01-20 NOTE — Telephone Encounter (Signed)
Pt called and states she is going out of the country [place] in [place] and she was wanting to know if she could get an AntiDiarrheal med or a yeast infection med incase. [facility] [address]

## 2018-01-20 NOTE — Telephone Encounter (Signed)
I recommend she take OTC Imodium with her in case of diarrhea and please send in one dose of diflucan 150 mg for her in case of yeast infection.

## 2018-01-21 MED ORDER — FLUCONAZOLE 150 MG PO TABS: 150.0000 mg | ORAL_TABLET | Freq: Once | ORAL | 0 refills | Status: AC

## 2018-01-21 NOTE — Telephone Encounter (Signed)
Med was sent in and pt was notified

## 2018-01-21 NOTE — Addendum Note (Signed)
Addended by: Herminio CommonsJOHNSON, Kaleisha Bhargava A on: 01/21/2018 09:32 AM   Modules accepted: Orders

## 2018-03-04 ENCOUNTER — Other Ambulatory Visit: Payer: Self-pay | Admitting: Family Medicine

## 2018-03-04 NOTE — Telephone Encounter (Signed)
Left message for pt to call me back 

## 2018-03-04 NOTE — Telephone Encounter (Signed)
Patient has been informed that medication was denied.

## 2018-03-04 NOTE — Telephone Encounter (Signed)
Patient wants to know if this medication can be refilled because had a cold about week ago. She has asthma and she is currently congested. She is traveling out the country and doesn't want to get worse while away and have nothing to take. Please advise refill request.

## 2018-08-31 IMAGING — CT CT ABD-PELV W/ CM
2 of 5 series · 15 of 46 positions shown, 17 images · IV contrast (iopamidol)
Comparison: None.

CLINICAL DATA: Abdominal pain and weight loss.

EXAM:
CT ABDOMEN AND PELVIS WITH CONTRAST
TECHNIQUE: Multidetector CT imaging of the abdomen and pelvis was performed
using the standard protocol following bolus administration of
intravenous contrast.
CONTRAST:  100mL Q3BVCU-WYY IOPAMIDOL (Q3BVCU-WYY) INJECTION 61%

[Series 3: abd/ pelvis 5.0 i30f 2 · axial · 0.97mm/px · z∈[+647,+1087]mm · 12 of 98 slices shown, 14 images]
[im 5/98  soft-tissue]
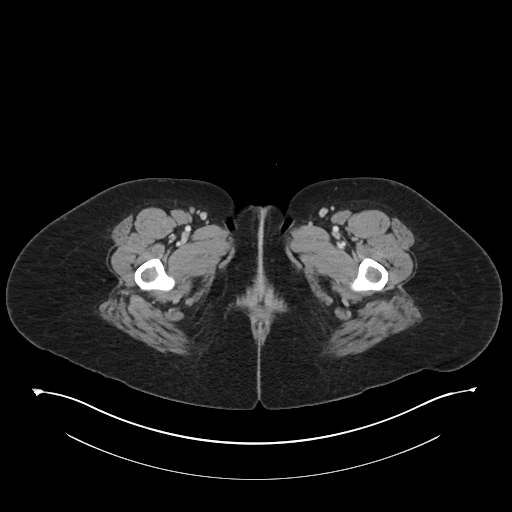
[im 5/98  bone]
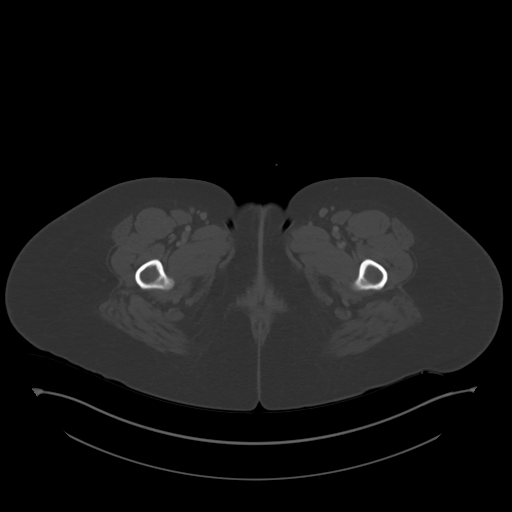
[im 15/98  soft-tissue]
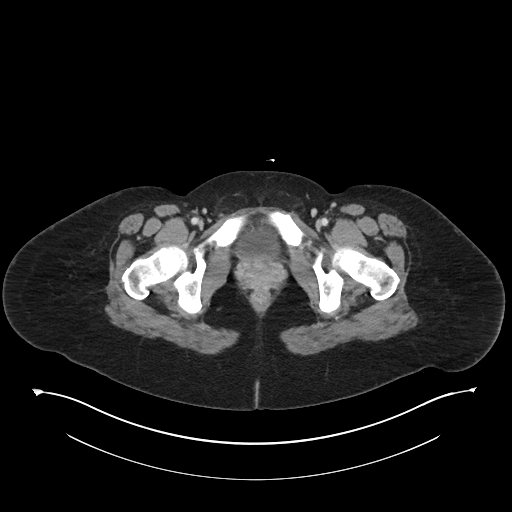
[im 20/98  soft-tissue]
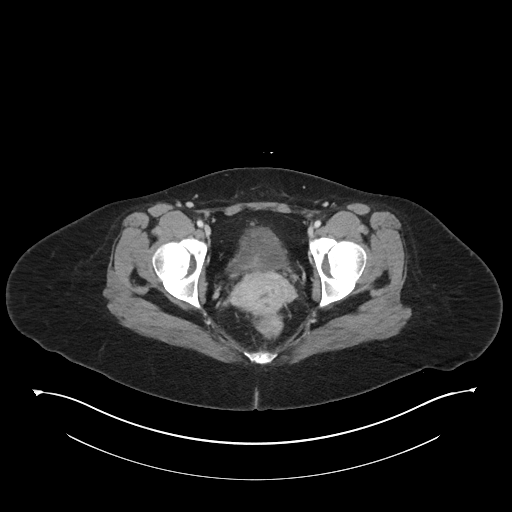
[im 30/98  soft-tissue]
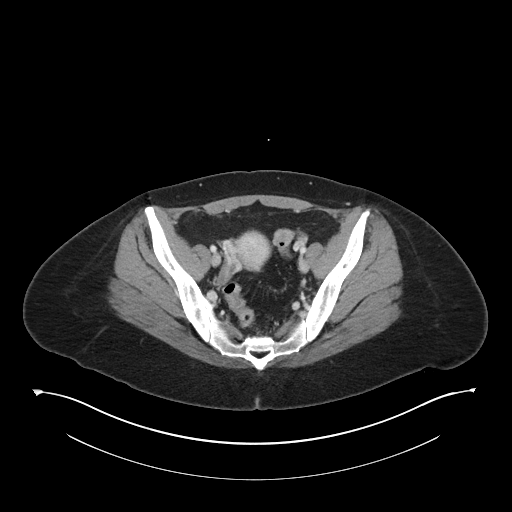
[im 39/98  soft-tissue]
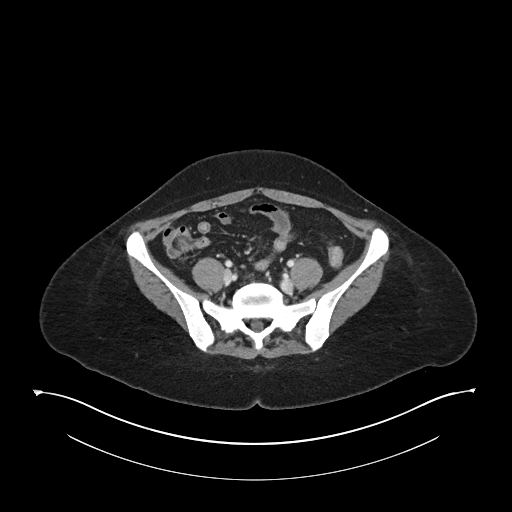
[im 44/98  soft-tissue]
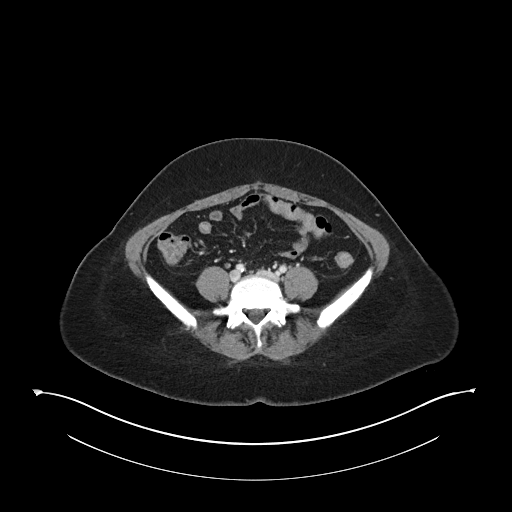
[im 54/98  soft-tissue]
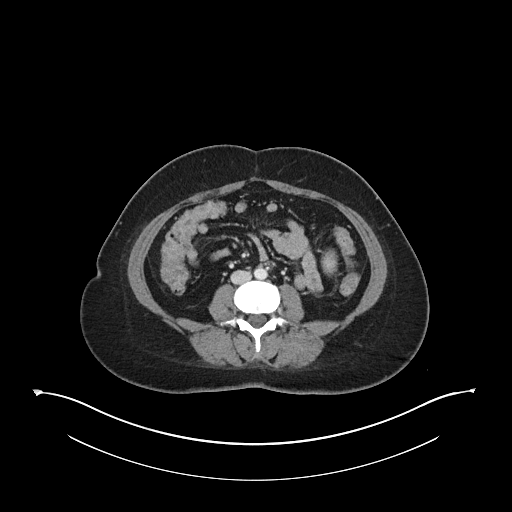
[im 59/98  soft-tissue]
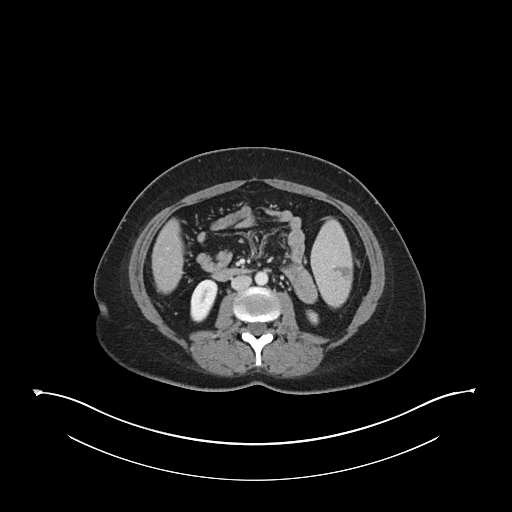
[im 68/98  soft-tissue]
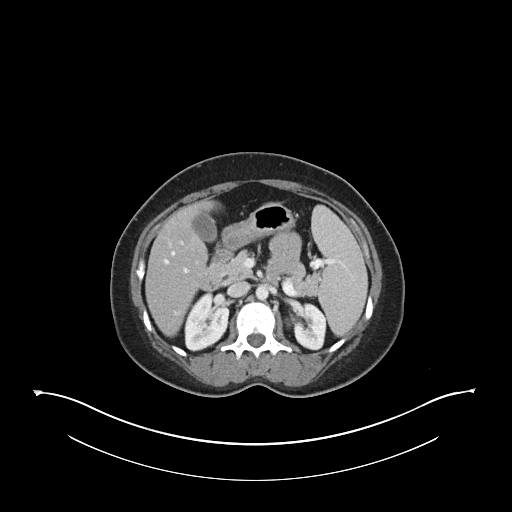
[im 68/98  bone]
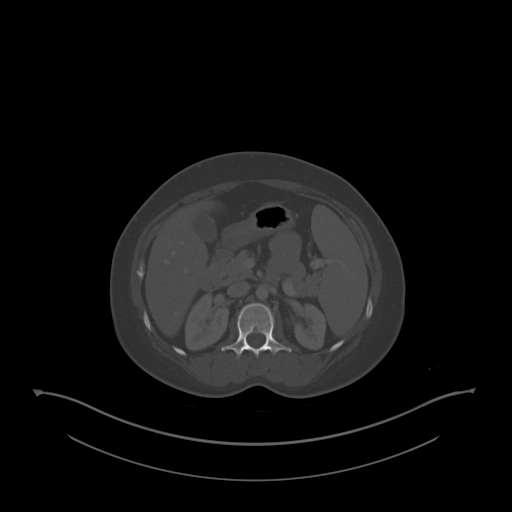
[im 78/98  soft-tissue]
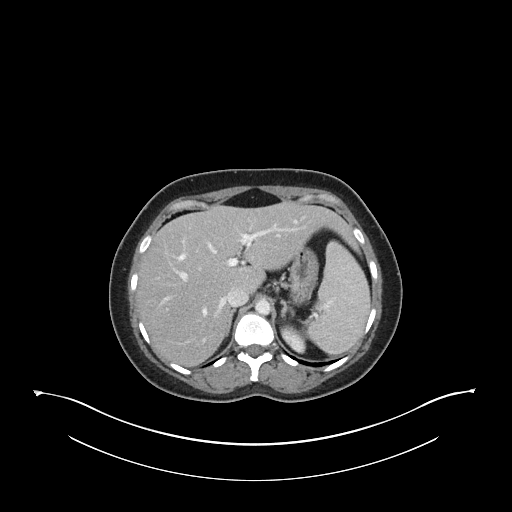
[im 83/98  soft-tissue]
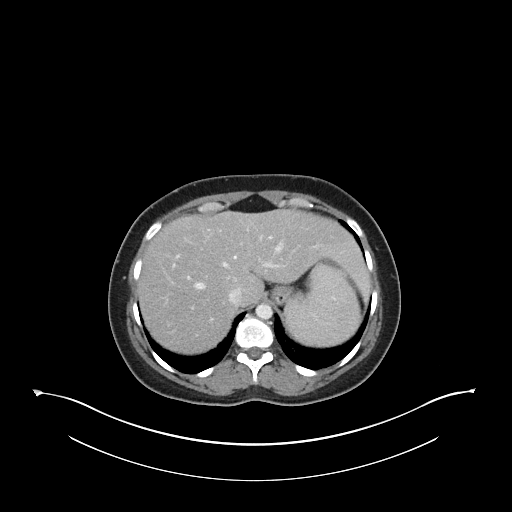
[im 93/98  soft-tissue]
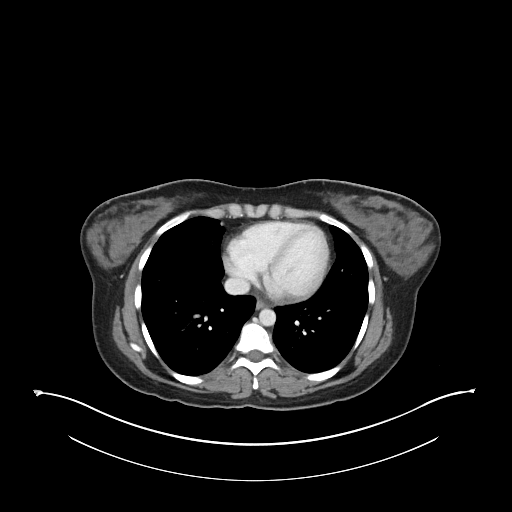

[Series 6: coronal soft tissue · coronal · 0.88mm/px · 3 of 105 slices shown]
[im 35/105  soft-tissue]
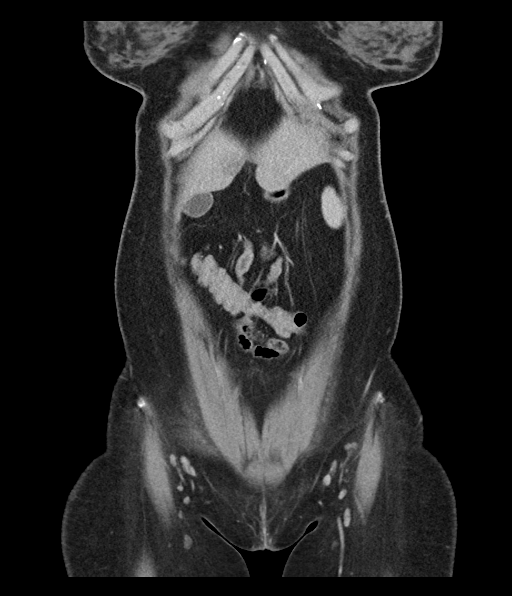
[im 47/105  soft-tissue]
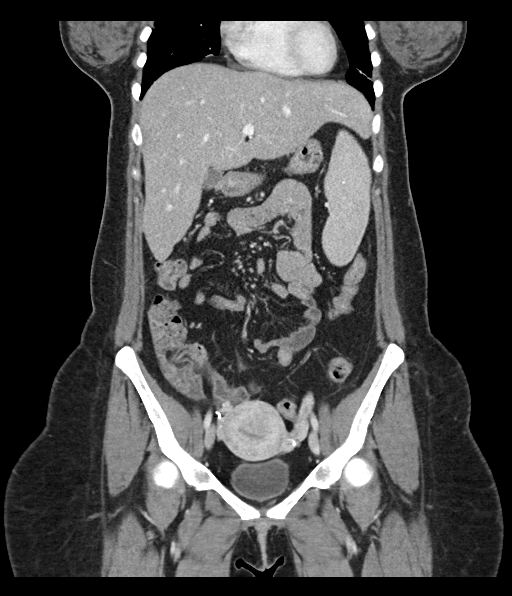
[im 58/105  soft-tissue]
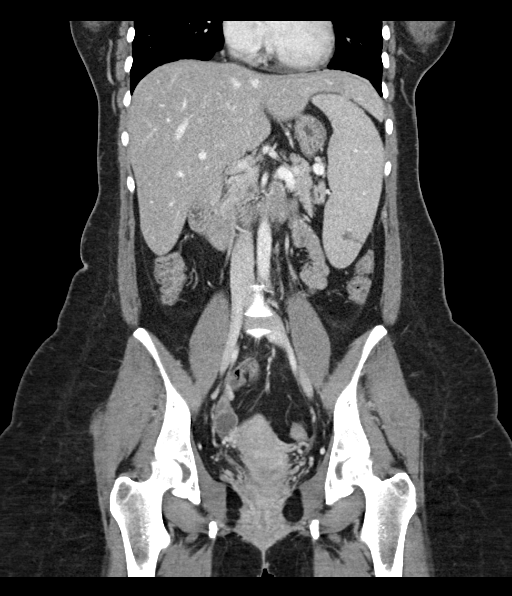

[15 of 46 positions shown; findings below may reference images not displayed]

FINDINGS: Lower chest: Limited visualization of the lower thorax demonstrates
minimal dependent subpleural ground-glass atelectasis. No focal
airspace opacities.

Normal heart size.  No pericardial effusion.

Hepatobiliary: Normal hepatic contour. There is a minimal amount of
focal fatty infiltration adjacent to the fissure for ligamentum
teres. No discrete hepatic lesions. Normal appearance of the
gallbladder given degree distention. Note is made of a phrygian cap.
No radiopaque gallstones. No intra or extrahepatic biliary ductal
dilatation. No ascites.

Pancreas: Normal appearance of the pancreas

Spleen: The spleen is enlarged measuring 17.5 cm in greatest oblique
sagittal diameter (sagittal image 83, series 7). Wedge-shaped
hypoattenuating lesions within in the caudal aspect of the spleen
are likely perfusional etiology. Note is made of a small splenule.

Adrenals/Urinary Tract: There is symmetric enhancement and excretion
of the bilateral kidneys. No definite renal stones in this
postcontrast examination. No discrete renal lesions. No urine
obstruction.

Normal appearance of the bilateral adrenal glands.

Normal appearance of the urinary bladder given degree distention.

Stomach/Bowel: The bowel is normal in course and caliber without
wall thickening or evidence of enteric obstruction. Normal
appearance of the terminal ileum and appendix. No pneumoperitoneum,
pneumatosis or portal venous gas.

Vascular/Lymphatic: Minimal amount of eccentric mixed calcified and
noncalcified atherosclerotic plaque within the right common iliac
artery. The abdominal aorta is of normal caliber. The major branch
vessels of the abdominal aorta appear widely patent on this non CTA
examination.

No bulky retroperitoneal, mesenteric, pelvic or inguinal
lymphadenopathy.

Reproductive: Note is made of an approximately 2.2 x 1.8 cm
hypoattenuating right-sided presumably physiologic adnexal cyst.
Post bilateral tubal ligation.

There is a small amount of presumably physiologic free fluid in the
pelvic cul-de-sac (image 75, series 3).

Other: Regional soft tissues appear normal.

Musculoskeletal: No acute or aggressive osseous abnormalities.
IMPRESSION: Splenomegaly of an indeterminate etiology. Otherwise, no explanation
for patient's abdominal pain and weight loss.

## 2018-09-22 ENCOUNTER — Other Ambulatory Visit: Payer: Self-pay | Admitting: Family Medicine

## 2018-09-22 NOTE — Telephone Encounter (Signed)
[facility] Please advise KH 

## 2018-09-23 MED ORDER — ALBUTEROL SULFATE HFA 108 (90 BASE) MCG/ACT IN AERS: INHALATION_SPRAY | RESPIRATORY_TRACT | 1 refills | Status: DC

## 2018-09-23 NOTE — Addendum Note (Signed)
Addended by: Herminio Commons A on: 09/23/2018 09:26 AM   Modules accepted: Orders

## 2020-02-11 ENCOUNTER — Other Ambulatory Visit: Payer: Self-pay

## 2020-02-11 ENCOUNTER — Ambulatory Visit: Payer: Managed Care, Other (non HMO) | Admitting: Family Medicine

## 2020-02-11 ENCOUNTER — Encounter: Payer: Self-pay | Admitting: Family Medicine

## 2020-02-11 VITALS — BP 130/90 | HR 76 | Temp 97.7°F | Wt 171.4 lb

## 2020-02-11 DIAGNOSIS — J302 Other seasonal allergic rhinitis: Secondary | ICD-10-CM | POA: Insufficient documentation

## 2020-02-11 DIAGNOSIS — R6884 Jaw pain: Secondary | ICD-10-CM

## 2020-02-11 DIAGNOSIS — M26623 Arthralgia of bilateral temporomandibular joint: Secondary | ICD-10-CM | POA: Diagnosis not present

## 2020-02-11 DIAGNOSIS — J4520 Mild intermittent asthma, uncomplicated: Secondary | ICD-10-CM

## 2020-02-11 MED ORDER — ALBUTEROL SULFATE HFA 108 (90 BASE) MCG/ACT IN AERS: INHALATION_SPRAY | RESPIRATORY_TRACT | 0 refills | Status: DC

## 2020-02-11 MED ORDER — METHOCARBAMOL 500 MG PO TABS: 500.0000 mg | ORAL_TABLET | Freq: Four times a day (QID) | ORAL | 0 refills | Status: DC

## 2020-02-11 NOTE — Progress Notes (Signed)
Subjective:    Patient ID: Ariel Ayala, female    DOB: Dec 18, 1975, 44 y.o.   MRN: [id number]  HPI Chief Complaint  Patient presents with  . TMJ    2 weeks of TMJ pain.    Complains of a 2 week history of a dull pain in her upper and lower bilateral jaws and upper and lower teeth. She feels tightness in both TMJs.   Reports having increased stress and thinks she is clenching her teeth at night. Bought a dental guard but this seems to making her symptoms worse.   Last dental exam in January 2021 and no X rays.  Denies any tooth sensitivity or gum pain.  Does not chew gum.   Denies fever, chills, dizziness, headache, ear pain, sinus pressure, nasal congestion, chest pain, palpitations, shortness of breath, abdominal pain, N/V/D, LE edema.   States she has been taking Claritin for years and does not think it is working any longer. Having issues with allergies.  She is requesting a refill of albuterol. Does not need it often.  Asthma trigger is when she gets sick but no illness this past winter.   Reviewed allergies, medications, past medical, surgical, family, and social history.   Review of Systems Pertinent positives and negatives in the history of present illness.     Objective:   Physical Exam Constitutional:      General: She is not in acute distress.    Appearance: She is not ill-appearing.  HENT:     Head:     Jaw: Tenderness present.     Comments: Abnormal alignment. Bilateral TMJs with TTP    Right Ear: Ear canal normal.     Left Ear: Tympanic membrane and ear canal normal.     Ears:     Comments: Right ear with clear fluid behind TM    Nose: No rhinorrhea.     Comments: Erythema of left nare     Mouth/Throat:     Mouth: Mucous membranes are moist.     Pharynx: Oropharynx is clear. No oropharyngeal exudate or posterior oropharyngeal erythema.  Cardiovascular:     Rate and Rhythm: Normal rate and regular rhythm.  Musculoskeletal:     Cervical back:  Normal range of motion and neck supple. No tenderness.  Lymphadenopathy:     Cervical: No cervical adenopathy.  Skin:    General: Skin is warm and dry.     Capillary Refill: Capillary refill takes less than 2 seconds.  Neurological:     General: No focal deficit present.     Mental Status: She is alert and oriented to person, place, and time.     Cranial Nerves: No cranial nerve deficit.     Sensory: No sensory deficit.    BP 130/90   Pulse 76   Temp 97.7 F (36.5 C)   Wt 171 lb 6.4 oz (77.7 kg)   BMI 32.39 kg/m       Assessment & Plan:  TMJ tenderness, bilateral - Plan: methocarbamol (ROBAXIN) 500 MG tablet -discussed relaxation techniques during the day to avoid clenching. She will try 2 Aleve twice daily with food and may want to add a PPI while taking it. I will also prescribe a muscle relaxant to use in the evening as needed since pain is waking her up. Recommend she follow up with her dentist if not improving. No sign of infectious process or cardiac   Mild intermittent asthma without complication - Plan: albuterol (PROAIR HFA) 108 (  90 Base) MCG/ACT inhaler -controlled. Refill albuterol   Jaw pain -TMJ pain  Seasonal allergies -switch to Xyzal.

## 2020-02-11 NOTE — Patient Instructions (Signed)
Switch from Claritin to Xyzal.   Take 2 Aleve twice daily and use the Robaxin at bedtime if needed.

## 2020-03-03 ENCOUNTER — Other Ambulatory Visit: Payer: Self-pay | Admitting: Family Medicine

## 2020-03-03 DIAGNOSIS — J4520 Mild intermittent asthma, uncomplicated: Secondary | ICD-10-CM

## 2020-09-26 ENCOUNTER — Ambulatory Visit: Payer: Managed Care, Other (non HMO) | Admitting: Family Medicine

## 2020-10-16 ENCOUNTER — Encounter: Payer: Self-pay | Admitting: Family Medicine

## 2020-10-16 ENCOUNTER — Ambulatory Visit: Payer: Managed Care, Other (non HMO) | Admitting: Family Medicine

## 2020-10-16 ENCOUNTER — Other Ambulatory Visit: Payer: Self-pay

## 2020-10-16 VITALS — BP 130/80 | HR 74 | Temp 98.4°F | Wt 162.0 lb

## 2020-10-16 DIAGNOSIS — K219 Gastro-esophageal reflux disease without esophagitis: Secondary | ICD-10-CM | POA: Diagnosis not present

## 2020-10-16 DIAGNOSIS — R1084 Generalized abdominal pain: Secondary | ICD-10-CM

## 2020-10-16 DIAGNOSIS — R197 Diarrhea, unspecified: Secondary | ICD-10-CM

## 2020-10-16 NOTE — Progress Notes (Signed)
° °  Subjective:    Patient ID: Ariel Ayala, female    DOB: 1976/05/29, 43 y.o.   MRN: [id number]  HPI Chief Complaint  Patient presents with   acid reflux    month long acid reflux. took pecid for a week and subsided, yesterday monring started out of stomach pain and then moved up and staritng burning in chest. but this morning burning in chest subsided but stomach is still flared up and had diarrhea. had CMV so not sure if its flared up   Complains of a one month history of intermittent abdominal pain that she describes as burning and bloating. 3 different episodes with the last one and most severe one yesterday. She does recall having tomato sauce which has been a trigger in the past. No other new foods. States she did overeat at one particular meal during the holiday.   States the pain started in her lower abdomen and then she felt a burning sensation in her chest last night. States she stay propped herself up and took antacids which improved her pain. She took Pepcid, Pepcid and Mylanta.   States her anxiety flared up with the pain making things worse.   States she started having diarrhea this morning. 2 episodes. States her stool were black like when she has taken Pepto Bismol in the past. No BRBPR or tarry stools.  Bowel movement yesterday was soft.   No recent NSAID use. Denies alcohol use.  She has never seen GI.   LMP: 10/06/2020 Contraception: E-sure  CT scan of her abdomen done in 2018 for abdominal pain and bloating. Negative except for splenomegaly.   Denies fever, chills, night sweats, fatigue, headache, dizziness, palpitations, shortness of breath, cough, vomiting, urinary symptoms, LE edema.   Reviewed allergies, medications, past medical, surgical, family, and social history.    Review of Systems Pertinent positives and negatives in the history of present illness.     Objective:   Physical Exam BP 130/80    Pulse 74    Temp 98.4 F (36.9 C)    Wt 162 lb  (73.5 kg)    BMI 30.61 kg/m   Alert and in no distress.  Neck is supple without adenopathy or thyromegaly. Cardiac exam shows a regular sinus rhythm without murmurs or gallops. Lungs are clear to auscultation. Abdomen is soft, nondistended, hypoactive bowel sounds, mild tenderness to palpation in the lower quadrants without rebound or referred pain. No obvious masses or hernia. No hepatosplenomegaly. Extremities without edema. Skin is warm and dry. Sclera anicteric PERRLA. Normal speech, mood and thought process.      Assessment & Plan:  Gastroesophageal reflux disease, unspecified whether esophagitis present  Generalized abdominal pain - Plan: CBC with Differential/Platelet, Comprehensive metabolic panel, Lipase, Amylase  Diarrhea, unspecified type - Plan: CBC with Differential/Platelet, Comprehensive metabolic panel  Reviewed CT abdomen from 2018 when she was having abdominal bloating and weight loss. At that time her spleen was enlarged with no obvious etiology per radiologist. I will check labs including CBC, CMP and pancreatic enzymes. Discussed possible etiologies including GERD, gastritis but overall her abdominal exam is benign. We will try Prilosec or Nexium in the morning and Pepcid in the evening for the next week. She will let me know if she develops fever, worsening abdominal pain or bright red blood in her stool. She is currently scheduled for a physical with me next week.

## 2020-10-16 NOTE — Patient Instructions (Signed)
Try taking Prilosec or Nexium in the mornings.  These are over-the-counter.  You can also take Pepcid in the evening.  Try to avoid any foods or beverages that trigger reflux. Avoid overeating.  Try to eat small frequent meals. Avoid eating and laying down.  Certain foods such as peppermint and chocolate can trigger reflux.  Stay well-hydrated.  If you notice any bright red blood in your stools or develop a fever or worsening abdominal pain, that you would need to be seen again.    Food Choices for Gastroesophageal Reflux Disease, Adult When you have gastroesophageal reflux disease (GERD), the foods you eat and your eating habits are very important. Choosing the right foods can help ease your discomfort. Think about working with a nutrition specialist (dietitian) to help you make good choices. What are tips for following this plan?  Meals  Choose healthy foods that are low in fat, such as fruits, vegetables, whole grains, low-fat dairy products, and lean meat, fish, and poultry.  Eat small meals often instead of 3 large meals a day. Eat your meals slowly, and in a place where you are relaxed. Avoid bending over or lying down until 2-3 hours after eating.  Avoid eating meals 2-3 hours before bed.  Avoid drinking a lot of liquid with meals.  Cook foods using methods other than frying. Bake, grill, or broil food instead.  Avoid or limit: ? Chocolate. ? Peppermint or spearmint. ? Alcohol. ? Pepper. ? Black and decaffeinated coffee. ? Black and decaffeinated tea. ? Bubbly (carbonated) soft drinks. ? Caffeinated energy drinks and soft drinks.  Limit high-fat foods such as: ? Fatty meat or fried foods. ? Whole milk, cream, butter, or ice cream. ? Nuts and nut butters. ? Pastries, donuts, and sweets made with butter or shortening.  Avoid foods that cause symptoms. These foods may be different for everyone. Common foods that cause symptoms include: ? Tomatoes. ? Oranges,  lemons, and limes. ? Peppers. ? Spicy food. ? Onions and garlic. ? Vinegar. Lifestyle  Maintain a healthy weight. Ask your doctor what weight is healthy for you. If you need to lose weight, work with your doctor to do so safely.  Exercise for at least 30 minutes for 5 or more days each week, or as told by your doctor.  Wear loose-fitting clothes.  Do not smoke. If you need help quitting, ask your doctor.  Sleep with the head of your bed higher than your feet. Use a wedge under the mattress or blocks under the bed frame to raise the head of the bed. Summary  When you have gastroesophageal reflux disease (GERD), food and lifestyle choices are very important in easing your symptoms.  Eat small meals often instead of 3 large meals a day. Eat your meals slowly, and in a place where you are relaxed.  Limit high-fat foods such as fatty meat or fried foods.  Avoid bending over or lying down until 2-3 hours after eating.  Avoid peppermint and spearmint, caffeine, alcohol, and chocolate. This information is not intended to replace advice given to you by your health care provider. Make sure you discuss any questions you have with your health care provider. Document Revised: 02/25/2019 Document Reviewed: 12/10/2016 Elsevier Patient Education  2020 [employer].

## 2020-10-17 LAB — CBC WITH DIFFERENTIAL/PLATELET
Basophils Absolute: 0 10*3/uL (ref 0.0–0.2)
Basos: 0 %
EOS (ABSOLUTE): 0.1 10*3/uL (ref 0.0–0.4)
Eos: 1 %
Hematocrit: 39.4 % (ref 34.0–46.6)
Hemoglobin: 13.4 g/dL (ref 11.1–15.9)
Immature Grans (Abs): 0 10*3/uL (ref 0.0–0.1)
Immature Granulocytes: 0 %
Lymphocytes Absolute: 2.4 10*3/uL (ref 0.7–3.1)
Lymphs: 24 %
MCH: 28.3 pg (ref 26.6–33.0)
MCHC: 34 g/dL (ref 31.5–35.7)
MCV: 83 fL (ref 79–97)
Monocytes Absolute: 0.7 10*3/uL (ref 0.1–0.9)
Monocytes: 7 %
Neutrophils Absolute: 6.7 10*3/uL (ref 1.4–7.0)
Neutrophils: 68 %
Platelets: 397 10*3/uL (ref 150–450)
RBC: 4.73 x10E6/uL (ref 3.77–5.28)
RDW: 13.2 % (ref 11.7–15.4)
WBC: 10 10*3/uL (ref 3.4–10.8)

## 2020-10-17 LAB — COMPREHENSIVE METABOLIC PANEL
ALT: 14 IU/L (ref 0–32)
AST: 16 IU/L (ref 0–40)
Albumin/Globulin Ratio: 1.6 (ref 1.2–2.2)
Albumin: 4.6 g/dL (ref 3.8–4.8)
Alkaline Phosphatase: 71 IU/L (ref 44–121)
BUN/Creatinine Ratio: 13 (ref 9–23)
BUN: 10 mg/dL (ref 6–24)
Bilirubin Total: 0.4 mg/dL (ref 0.0–1.2)
CO2: 23 mmol/L (ref 20–29)
Calcium: 9.7 mg/dL (ref 8.7–10.2)
Chloride: 102 mmol/L (ref 96–106)
Creatinine, Ser: 0.78 mg/dL (ref 0.57–1.00)
GFR calc Af Amer: 106 mL/min/{1.73_m2} (ref >59)
GFR calc non Af Amer: 92 mL/min/{1.73_m2} (ref >59)
Globulin, Total: 2.8 g/dL (ref 1.5–4.5)
Glucose: 97 mg/dL (ref 65–99)
Potassium: 4.4 mmol/L (ref 3.5–5.2)
Sodium: 139 mmol/L (ref 134–144)
Total Protein: 7.4 g/dL (ref 6.0–8.5)

## 2020-10-17 LAB — LIPASE: Lipase: 22 U/L (ref 14–72)

## 2020-10-17 LAB — AMYLASE: Amylase: 25 U/L — ABNORMAL LOW (ref 31–110)

## 2020-10-23 ENCOUNTER — Ambulatory Visit (INDEPENDENT_AMBULATORY_CARE_PROVIDER_SITE_OTHER): Payer: Managed Care, Other (non HMO) | Admitting: Family Medicine

## 2020-10-23 ENCOUNTER — Encounter: Payer: Self-pay | Admitting: Family Medicine

## 2020-10-23 ENCOUNTER — Other Ambulatory Visit: Payer: Self-pay

## 2020-10-23 VITALS — BP 120/68 | HR 74 | Ht 61.0 in | Wt 161.8 lb

## 2020-10-23 DIAGNOSIS — R1084 Generalized abdominal pain: Secondary | ICD-10-CM

## 2020-10-23 DIAGNOSIS — Z789 Other specified health status: Secondary | ICD-10-CM

## 2020-10-23 DIAGNOSIS — Z1211 Encounter for screening for malignant neoplasm of colon: Secondary | ICD-10-CM | POA: Diagnosis not present

## 2020-10-23 DIAGNOSIS — Z1329 Encounter for screening for other suspected endocrine disorder: Secondary | ICD-10-CM

## 2020-10-23 DIAGNOSIS — R140 Abdominal distension (gaseous): Secondary | ICD-10-CM | POA: Diagnosis not present

## 2020-10-23 DIAGNOSIS — K30 Functional dyspepsia: Secondary | ICD-10-CM

## 2020-10-23 DIAGNOSIS — R6881 Early satiety: Secondary | ICD-10-CM

## 2020-10-23 DIAGNOSIS — Z13220 Encounter for screening for lipoid disorders: Secondary | ICD-10-CM

## 2020-10-23 DIAGNOSIS — Z0000 Encounter for general adult medical examination without abnormal findings: Secondary | ICD-10-CM | POA: Diagnosis not present

## 2020-10-23 DIAGNOSIS — R5383 Other fatigue: Secondary | ICD-10-CM

## 2020-10-23 DIAGNOSIS — R161 Splenomegaly, not elsewhere classified: Secondary | ICD-10-CM

## 2020-10-23 HISTORY — DX: Early satiety: R68.81

## 2020-10-23 HISTORY — DX: Abdominal distension (gaseous): R14.0

## 2020-10-23 HISTORY — DX: Generalized abdominal pain: R10.84

## 2020-10-23 HISTORY — DX: Functional dyspepsia: K30

## 2020-10-23 NOTE — Progress Notes (Signed)
Subjective:    Patient ID: Ariel Ayala, female    DOB: [birth year], 44 y.o.   MRN: [id number]  HPI Chief Complaint  Patient presents with  . cpe    cpe, nonfasting, would like vitamin d, b12. sees obgyn   She is here for a complete physical exam. Last CPE: years ago   Other providers: OB/GYN- Dr. Velvet Bathe at [facility]   Reports feeling tired fairly often.  She would like to have her B12 and vitamin D levels checked. Fatigue does not keep her from doing what she needs to do.  She has a long history of intermittent generalized abdominal pain, bloating, early satiety as well as a history of splenomegaly. Reports feeling better than she did last week when she came in with more severe abdominal pain and bloating. No longer has diarrhea.    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 Walks 1 day per week   Immunizations: declines flu and Tdap   Health maintenance:  Mammogram: 2021  Colonoscopy: never  Last Gynecological Exam: 2021  Last Dental Exam: twice yearly  Last Eye Exam: appt  January 2022  Wears seatbelt always, uses sunscreen, smoke detectors in home and functioning, does not text while driving and feels safe in home environment.   Reviewed allergies, medications, past medical, surgical, family, and social history.   Review of Systems Review of Systems Constitutional: -fever, -chills, -sweats, -unexpected weight change,+fatigue ENT: -runny nose, -ear pain, -sore throat Cardiology:  -chest pain, -palpitations, -edema Respiratory: -cough, -shortness of breath, -wheezing Gastroenterology: +abdominal pain, -nausea, -vomiting, -diarrhea, -constipation  Hematology: -bleeding or bruising problems Musculoskeletal: -arthralgias, -myalgias, -joint swelling, -back  pain Ophthalmology: -vision changes Urology: -dysuria, -difficulty urinating, -hematuria, -urinary frequency, -urgency Neurology: -headache, -weakness, -tingling, -numbness       Objective:   Physical Exam BP 120/68   Pulse 74   Ht 5\' 1"  (1.549 m)   Wt 161 lb 12.8 oz (73.4 kg)   LMP 10/06/2020   BMI 30.57 kg/m   General Appearance:    Alert, cooperative, no distress, appears stated age  Head:    Normocephalic, without obvious abnormality, atraumatic  Eyes:    PERRL, conjunctiva/corneas clear, EOM's intact  Ears:    Normal TM's and external ear canals  Nose:  Mask on  Throat:  Mask on  Neck:   Supple, no lymphadenopathy;  thyroid:  no   enlargement/tenderness/nodules; no carotid   bruit or JVD  Back:    Spine nontender, no curvature, ROM normal, no CVA     tenderness  Lungs:     Clear to auscultation bilaterally without wheezes, rales or     ronchi; respirations unlabored  Chest Wall:    No tenderness or deformity   Heart:    Regular rate and rhythm, S1 and S2 normal, no murmur, rub   or gallop  Breast Exam:   OB/GYN  Abdomen:     Soft, non-tender, nondistended, normoactive bowel sounds,    no masses, no hepatosplenomegaly. Negative Murphys sign.   Genitalia:   OB/GYN     Extremities:   No clubbing, cyanosis or edema  Pulses:   2+ and symmetric all extremities  Skin:   Skin color, texture, turgor normal, no rashes or lesions  Lymph  nodes:   Cervical, supraclavicular, and axillary nodes normal  Neurologic:   CNII-XII intact, normal strength, sensation and gait; reflexes 2+ and symmetric throughout          Psych:   Normal mood, affect, hygiene and grooming.        Assessment & Plan:  Routine general medical examination at a health care facility -She is here for her CPE and is nonfasting.  I will order future labs.  She sees her OB/GYN and reports being up-to-date on her Pap smear and mammogram.  I will request both of these from her OB/GYN.  Referral to GI for her  screening colonoscopy.  Counseling on healthy lifestyle including diet and exercise.  Recommend regular dental and eye exams.  Immunizations reviewed.  Declines flu shot.  Discussed safety.  Screening for thyroid disorder - Plan: TSH, T4, free -Done per screening guidelines  Splenomegaly - Plan: Ambulatory referral to Gastroenterology -CT abdomen pelvis from 2018 showed splenomegaly.  Unable to palpate her spleen on exam today.  Generalized abdominal pain - Plan: Ambulatory referral to Gastroenterology -Recurrent abdominal pain.  This was more severe last week.  Refer to GI for further evaluation  Bloating - Plan: Ambulatory referral to Gastroenterology -Bloating was worse last week.  Discussed the possibility of gallbladder disease however she is asymptomatic and has a benign exam today.  Refer to GI  Indigestion - Plan: Ambulatory referral to Gastroenterology -She has Pepcid at home but has not been needing it.  She is watching her diet.  Early satiety - Plan: Ambulatory referral to Gastroenterology -Reports recent weight loss.  Refer to GI  Screen for colon cancer - Plan: Ambulatory referral to Gastroenterology -Per screening guidelines  Screening for lipid disorders  Vegetarian diet - Plan: Vitamin B12  Fatigue, unspecified type - Plan: VITAMIN D 25 Hydroxy (Vit-D Deficiency, Fractures), Vitamin B12, TSH, T4, free -Discussed multiple etiologies for fatigue.  She would like labs checked to screen for underlying B12 or vitamin D deficiencies.

## 2020-10-23 NOTE — Patient Instructions (Signed)

## 2020-10-24 ENCOUNTER — Other Ambulatory Visit: Payer: Managed Care, Other (non HMO)

## 2020-10-24 DIAGNOSIS — R5383 Other fatigue: Secondary | ICD-10-CM

## 2020-10-24 DIAGNOSIS — Z1329 Encounter for screening for other suspected endocrine disorder: Secondary | ICD-10-CM

## 2020-10-24 DIAGNOSIS — Z789 Other specified health status: Secondary | ICD-10-CM

## 2020-10-25 ENCOUNTER — Encounter: Payer: Self-pay | Admitting: Family Medicine

## 2020-10-25 DIAGNOSIS — E559 Vitamin D deficiency, unspecified: Secondary | ICD-10-CM

## 2020-10-25 HISTORY — DX: Vitamin D deficiency, unspecified: E55.9

## 2020-10-25 LAB — VITAMIN B12: Vitamin B-12: 289 pg/mL (ref 232–1245)

## 2020-10-25 LAB — TSH: TSH: 2.51 u[IU]/mL (ref 0.450–4.500)

## 2020-10-25 LAB — T4, FREE: Free T4: 1.09 ng/dL (ref 0.82–1.77)

## 2020-10-25 LAB — VITAMIN D 25 HYDROXY (VIT D DEFICIENCY, FRACTURES): Vit D, 25-Hydroxy: 25.7 ng/mL — ABNORMAL LOW (ref 30.0–100.0)

## 2020-11-08 ENCOUNTER — Telehealth: Payer: Self-pay | Admitting: Family Medicine

## 2020-11-08 NOTE — Telephone Encounter (Signed)
Received fax from [facility] stating a signed release is required.

## 2021-02-26 ENCOUNTER — Encounter: Payer: Self-pay | Admitting: Internal Medicine

## 2021-03-13 DIAGNOSIS — A6000 Herpesviral infection of urogenital system, unspecified: Secondary | ICD-10-CM | POA: Insufficient documentation

## 2021-04-04 ENCOUNTER — Encounter: Payer: Self-pay | Admitting: Family Medicine

## 2021-04-04 ENCOUNTER — Ambulatory Visit: Payer: Managed Care, Other (non HMO) | Admitting: Family Medicine

## 2021-04-04 ENCOUNTER — Other Ambulatory Visit: Payer: Self-pay

## 2021-04-04 VITALS — BP 100/70 | HR 72 | Temp 97.7°F | Ht 61.0 in | Wt 162.6 lb

## 2021-04-04 DIAGNOSIS — L259 Unspecified contact dermatitis, unspecified cause: Secondary | ICD-10-CM | POA: Diagnosis not present

## 2021-04-04 DIAGNOSIS — B009 Herpesviral infection, unspecified: Secondary | ICD-10-CM | POA: Diagnosis not present

## 2021-04-04 MED ORDER — TRIAMCINOLONE ACETONIDE 0.1 % EX CREA: 1.0000 "application " | TOPICAL_CREAM | Freq: Two times a day (BID) | CUTANEOUS | 0 refills | Status: DC

## 2021-04-04 NOTE — Patient Instructions (Signed)
ed.  Drink plenty of water. Use 1% hydrocortisone cream 2-3 times daily to the milder areas on the hands and arms. Use the prescription cream twice daily to the affected area on buttock/hip/thigh. Use it sparingly, only to the affected areas, and once significantly improved, change to hydrocortisone until resolved (don't continue to use strong steroid on normal-appearing skin). If you have continued spread, let us know and we can prescribe the oral steroids as we discussed.

## 2021-04-04 NOTE — Progress Notes (Signed)
Chief Complaint  Patient presents with  . Rash    Started on left hand 03/22/21-has been spreading since. Now on arms, left leg and left butt cheek. Was recently diagnosed with HSV1 and wonders if it could be related. Also has some off and on pain in her hips, L worse than R.    Rash started on her hands 5/4 or 5/5, on and off. Started on L hand--a few bumps, thought dog maybe got into poison ivy.  Red bumps, itchy, scratched them, used neosporin and they healed up. Then started on R hand, R elbow, and later to the left buttock, thigh, hip Started on the buttock for the last 3-4 days, and forearms a little before that. Getting worse earlier this week.  Has used OTC 1% HC, helps with itching, and a poison ivy lotion (looks like calamine), also helped some. Has been taking claritin daily.  No changes in products, no travel, no new meds/vitamins/foods. Has 1 dog (gives oral meds, not topical); some known poison ivy in the yard.  She was recently diagnosed with HSV (via culture, reports blood tests were negative), and she has a lot of questions regarding this. She has some L lateral hip pain, wondering if related to HSV>  PMH, PSH , SH reviewed   Outpatient Encounter Medications as of 04/04/2021  Medication Sig  . loratadine (CLARITIN) 10 MG tablet Take 10 mg by mouth daily as needed for allergies.  Marland Kitchen triamcinolone cream (KENALOG) 0.1 % Apply 1 application topically 2 (two) times daily. Use until rash resolved, or max of 2 weeks  . albuterol (VENTOLIN HFA) 108 (90 Base) MCG/ACT inhaler TAKE 2 PUFFS BY MOUTH EVERY 6 HOURS AS NEEDED FOR WHEEZE OR SHORTNESS OF BREATH (Patient not taking: Reported on 04/04/2021)   No facility-administered encounter medications on file as of 04/04/2021.   NOT using TAC prior to today's visit  Allergies  Allergen Reactions  . Sulfa Antibiotics Other (See Comments)    Throat swells and gets itchy   ROS: No f/c/n/v/d URI symptoms, chest pain, headaches, edema or  other problems, except as noted in HPI.   PHYSICAL EXAM:  BP 100/70   Pulse 72   Temp 97.7 F (36.5 C) (Tympanic)   Ht 5\' 1"  (1.549 m)   Wt 162 lb 9.6 oz (73.8 kg)   LMP 03/30/2021 Comment: E-sure  BMI 30.72 kg/m   Pleasant, well-appearing female, in no distress Skin: Healing/faint very small papules on L base of thumb. Small dry patch at the back of the right hand Very fine papules along the anterior and medial forearm. Minimal erythema, not vesicular. Very faint in appearance (more palpable, faintly visible). L forearm had just a few small papules anteriorly in mid-forearm region. Left buttock--larger area of raised, somewhat linear erythema, not focal papules There was one erythematous papule at the left hip/thigh Faint lesion on left upper thigh  ASSESSMENT/PLAN:  Contact dermatitis, unspecified contact dermatitis type, unspecified trigger - Plan: triamcinolone cream (KENALOG) 0.1 %  HSV-2 (herpes simplex virus 2) infection - reassured that nothing today appears to be related to HSV. Counseled/educated some re: symptoms/treatment   Suspect contact dermatitis, unclear etiology   Keep your skin well moisturized.  Drink plenty of water. Use 1% hydrocortisone cream 2-3 times daily to the milder areas on the hands and arms. Use the prescription cream twice daily to the affected area on buttock/hip/thigh. Use it sparingly, only to the affected areas, and once significantly improved, change to hydrocortisone until resolved (  don't continue to use strong steroid on normal-appearing skin). If you have continued spread, let us know and we can prescribe the oral steroids as we discussed.

## 2021-05-29 ENCOUNTER — Encounter: Payer: Self-pay | Admitting: Family Medicine

## 2021-09-03 ENCOUNTER — Other Ambulatory Visit: Payer: Self-pay | Admitting: Family Medicine

## 2021-09-03 ENCOUNTER — Encounter: Payer: Self-pay | Admitting: Family Medicine

## 2021-09-03 DIAGNOSIS — L259 Unspecified contact dermatitis, unspecified cause: Secondary | ICD-10-CM

## 2021-09-03 NOTE — Telephone Encounter (Signed)
Decline refill.  This was given in May for a rash. If she is having ongoing skin concerns, needs eval.

## 2021-11-06 DIAGNOSIS — R87619 Unspecified abnormal cytological findings in specimens from cervix uteri: Secondary | ICD-10-CM | POA: Insufficient documentation

## 2022-04-01 ENCOUNTER — Encounter: Payer: Self-pay | Admitting: Physician Assistant

## 2022-04-01 ENCOUNTER — Ambulatory Visit: Payer: Managed Care, Other (non HMO) | Admitting: Physician Assistant

## 2022-04-01 VITALS — BP 118/72 | HR 79 | Ht 61.0 in | Wt 142.6 lb

## 2022-04-01 DIAGNOSIS — Z6826 Body mass index (BMI) 26.0-26.9, adult: Secondary | ICD-10-CM

## 2022-04-01 DIAGNOSIS — F419 Anxiety disorder, unspecified: Secondary | ICD-10-CM | POA: Diagnosis not present

## 2022-04-01 DIAGNOSIS — F5101 Primary insomnia: Secondary | ICD-10-CM

## 2022-04-01 DIAGNOSIS — R350 Frequency of micturition: Secondary | ICD-10-CM | POA: Diagnosis not present

## 2022-04-01 LAB — POCT URINALYSIS DIP (CLINITEK)
Bilirubin, UA: NEGATIVE
Blood, UA: NEGATIVE
Glucose, UA: NEGATIVE mg/dL
Leukocytes, UA: NEGATIVE
Nitrite, UA: NEGATIVE
POC PROTEIN,UA: 30 — AB
Spec Grav, UA: 1.025 (ref 1.010–1.025)
Urobilinogen, UA: 0.2 E.U./dL
pH, UA: 6 (ref 5.0–8.0)

## 2022-04-01 MED ORDER — BUSPIRONE HCL 5 MG PO TABS: 5.0000 mg | ORAL_TABLET | Freq: Two times a day (BID) | ORAL | 3 refills | Status: DC

## 2022-04-01 MED ORDER — TRAZODONE HCL 50 MG PO TABS: 25.0000 mg | ORAL_TABLET | Freq: Every evening | ORAL | 3 refills | Status: DC | PRN

## 2022-04-01 NOTE — Assessment & Plan Note (Addendum)
-   chronic but getting worse, perimenopause may be an added component ?- start buspar 5 mg bid, continue peer counseling ?

## 2022-04-01 NOTE — Progress Notes (Addendum)
Eight ? ? ?Acute Office Visit ? ?Subjective:  ? ? Patient ID: Ariel PatchKerri L Lazarus, female    DOB: [birth year], 46 y.o.   MRN: [id number] ? ?Chief Complaint  ?Patient presents with  ? other  ?  Trouble sleeping, works from home had huge meeting at work last week, had some anxiety from the meeting at work only slept 2-3 hrs. The nights after the meeting. Had a lot of extra stress.   ? ? ?HPI ?Patient is in today for worse anxiety and insomnia; reports that she has had about 3 panic attacks in the past 8 years, usually deals with them on her own; once she did go to Urgent Care and was given a week's worth of medicine that did help her; 2 years ago went to therapy for anxiety and found it helpful; currently still sees a peer counselor every 2 weeks that is helpful; reports that she primarily works from home, but had to do an in person meeting and present in front of a lot of people last week; states she had a lot of anxiety about it that is still lingering; also is having work done on her house which is what she wants, but having the work done triggers some anxiety from the past and a former relationship; reports that she is sleeping 2 - 3 hours at night; 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 ?Outpatient Medications Prior to Visit  ?Medication Sig Dispense Refill  ? loratadine (CLARITIN) 10 MG tablet Take 10 mg by mouth daily as needed for allergies.    ? albuterol (VENTOLIN HFA) 108 (90 Base) MCG/ACT inhaler TAKE 2 PUFFS BY MOUTH EVERY 6 HOURS AS NEEDED FOR WHEEZE OR SHORTNESS OF BREATH (Patient not taking: Reported on 04/04/2021) 18 g 0  ? triamcinolone cream (KENALOG) 0.1 % Apply 1 application topically 2 (two) times daily. Use until rash resolved, or max of 2 weeks (Patient not taking: Reported on 04/01/2022) 30 g 0  ? ?No facility-administered medications prior to visit.  ? ? ?Allergies   ?Allergen Reactions  ? Sulfa Antibiotics Other (See Comments)  ?  Throat swells and gets itchy  ? ? ?Review of Systems  ?Constitutional:  Negative for activity change and chills.  ?HENT:  Negative for congestion and voice change.   ?Eyes:  Negative for pain and redness.  ?Respiratory:  Negative for cough and wheezing.   ?Cardiovascular:  Negative for chest pain.  ?Gastrointestinal:  Negative for constipation, diarrhea, nausea and vomiting.  ?Endocrine: Negative for polyuria.  ?Genitourinary:  Positive for frequency.  ?Skin:  Negative for color change and rash.  ?Allergic/Immunologic: Negative for immunocompromised state.  ?Neurological:  Negative for dizziness.  ?Psychiatric/Behavioral:  Positive for sleep disturbance. Negative for agitation. The patient is nervous/anxious.   ? ?   ?Objective:  ?  ?Physical Exam ?Vitals and nursing note reviewed.  ?Constitutional:   ?   General: She is not in acute distress. ?   Appearance: Normal appearance. She is not ill-appearing.  ?HENT:  ?   Head: Normocephalic and atraumatic.  ?   Right Ear: External ear normal.  ?   Left Ear: External ear normal.  ?   Nose: No congestion.  ?Eyes:  ?   Extraocular Movements: Extraocular movements intact.  ?   Conjunctiva/sclera: Conjunctivae normal.  ?   Pupils: Pupils are equal, round, and reactive to light.  ?  Cardiovascular:  ?   Rate and Rhythm: Normal rate and regular rhythm.  ?   Pulses: Normal pulses.  ?   Heart sounds: Normal heart sounds.  ?Pulmonary:  ?   Effort: Pulmonary effort is normal.  ?   Breath sounds: Normal breath sounds. No wheezing.  ?Abdominal:  ?   General: Bowel sounds are normal.  ?   Palpations: Abdomen is soft.  ?Musculoskeletal:     ?   General: Normal range of motion.  ?   Cervical back: Normal range of motion and neck supple.  ?   Right lower leg: No edema.  ?   Left lower leg: No edema.  ?Skin: ?   General: Skin is warm and dry.  ?   Findings: No bruising.  ?Neurological:  ?   General: No focal deficit present.   ?   Mental Status: She is alert and oriented to person, place, and time.  ?Psychiatric:     ?   Mood and Affect: Mood normal.     ?   Behavior: Behavior normal.     ?   Thought Content: Thought content normal.  ? ? ?BP 118/72   Pulse 79   Ht 5\' 1"  (1.549 m)   Wt 142 lb 9.6 oz (64.7 kg)   BMI 26.94 kg/m?  ? ?Wt Readings from Last 3 Encounters:  ?04/01/22 142 lb 9.6 oz (64.7 kg)  ?04/04/21 162 lb 9.6 oz (73.8 kg)  ?10/23/20 161 lb 12.8 oz (73.4 kg)  ? ? ?Results for orders placed or performed in visit on 04/01/22  ?POCT URINALYSIS DIP (CLINITEK)  ?Result Value Ref Range  ? Color, UA yellow yellow  ? Clarity, UA cloudy (A) clear  ? Glucose, UA negative negative mg/dL  ? Bilirubin, UA negative negative  ? Ketones, POC UA trace (5) (A) negative mg/dL  ? Spec Grav, UA 1.025 1.010 - 1.025  ? Blood, UA negative negative  ? pH, UA 6.0 5.0 - 8.0  ? POC PROTEIN,UA =30 (A) negative, trace  ? Urobilinogen, UA 0.2 0.2 or 1.0 E.U./dL  ? Nitrite, UA Negative Negative  ? Leukocytes, UA Negative Negative  ? ? ?   ?Assessment & Plan:  ?1. Anxiety ?- chronic but getting worse, perimenopause may be an added component ?- start buspar 5 mg bid, continue peer counseling ? ?2. Primary insomnia ?- chronic but getting worse ?- start trazodone 50 mg 1/2 tablet by mouth hs prn ? ?3. Urinary frequency ?- POCT URINALYSIS DIP (CLINITEK) ?- UA normal ? ? ? ?Meds ordered this encounter  ?Medications  ? busPIRone (BUSPAR) 5 MG tablet  ?  Sig: Take 1 tablet (5 mg total) by mouth 2 (two) times daily.  ?  Dispense:  60 tablet  ?  Refill:  3  ?  Order Specific Question:   Supervising Provider  ?  Answer:   04/03/22 [[id number]]  ? traZODone (DESYREL) 50 MG tablet  ?  Sig: Take 0.5-1 tablets (25-50 mg total) by mouth at bedtime as needed for sleep.  ?  Dispense:  30 tablet  ?  Refill:  3  ?  Order Specific Question:   Supervising Provider  ?  Answer:   Ronnald Nian [[id number]]  ? ? ?Return in about 6 months (around 10/02/2022) for Return for Annual  Exam with PCP 10/04/2022. ? ?If symptoms get worse, please call the office for a follow up appointment if available, otherwise go to Urgent Care, call 911 /  EMS, or go to the Emergency Department for further evaluation. ? ? ?Jake Shark, PA-C ?

## 2022-04-01 NOTE — Assessment & Plan Note (Addendum)
-   chronic but getting worse ?- start trazodone 50 mg 1/2 - 1 tablet by mouth hs prn ?

## 2022-04-02 ENCOUNTER — Other Ambulatory Visit: Payer: Self-pay | Admitting: Physician Assistant

## 2022-04-02 ENCOUNTER — Telehealth: Payer: Self-pay

## 2022-04-02 DIAGNOSIS — F419 Anxiety disorder, unspecified: Secondary | ICD-10-CM

## 2022-04-02 DIAGNOSIS — Z6826 Body mass index (BMI) 26.0-26.9, adult: Secondary | ICD-10-CM | POA: Insufficient documentation

## 2022-04-02 MED ORDER — ALPRAZOLAM 0.25 MG PO TABS: 0.2500 mg | ORAL_TABLET | Freq: Two times a day (BID) | ORAL | 0 refills | Status: DC | PRN

## 2022-04-02 NOTE — Progress Notes (Signed)
I have logged into and reviewed this patient's PMP[place]ware data today prior to issuing prescriptions for any controlled substances.  

## 2022-04-02 NOTE — Addendum Note (Signed)
Addended by: Burnard Hawthorne on: 04/02/2022 09:10 AM ? ? Modules accepted: Orders ? ?

## 2022-04-02 NOTE — Telephone Encounter (Signed)
Please tell her that I sent a ONE TIME prescription for Xanax 0.25 mg, take 1 pill twice a day as needed.  ?I also ordered a referral to Psychiatry for her. ?I will only prescribe Buspar in the future.

## 2022-04-02 NOTE — Telephone Encounter (Signed)
Pt called and and said she had another panic attack this morning. She said she didn't start the Buspar yet because when she read up on it it said it could take up to a month to work. She said she was given Xanax in the past and that did help. Please advise ?

## 2022-05-17 ENCOUNTER — Encounter: Payer: Self-pay | Admitting: Internal Medicine

## 2022-07-24 ENCOUNTER — Encounter: Payer: Self-pay | Admitting: Internal Medicine

## 2022-08-01 DIAGNOSIS — M7501 Adhesive capsulitis of right shoulder: Secondary | ICD-10-CM | POA: Insufficient documentation

## 2022-08-27 ENCOUNTER — Encounter: Payer: Self-pay | Admitting: Internal Medicine

## 2022-10-01 ENCOUNTER — Encounter: Payer: Self-pay | Admitting: Internal Medicine

## 2022-10-03 ENCOUNTER — Encounter: Payer: Managed Care, Other (non HMO) | Admitting: Physician Assistant

## 2022-10-31 LAB — HM MAMMOGRAPHY

## 2022-10-31 LAB — RESULTS CONSOLE HPV: CHL HPV: POSITIVE

## 2022-10-31 LAB — HM PAP SMEAR
HM Pap smear: POSITIVE
HM Pap smear: POSITIVE

## 2022-11-12 ENCOUNTER — Other Ambulatory Visit (INDEPENDENT_AMBULATORY_CARE_PROVIDER_SITE_OTHER): Payer: Managed Care, Other (non HMO)

## 2022-11-12 ENCOUNTER — Telehealth: Payer: Managed Care, Other (non HMO) | Admitting: Medical

## 2022-11-12 ENCOUNTER — Encounter: Payer: Self-pay | Admitting: Medical

## 2022-11-12 VITALS — Ht 61.0 in | Wt 147.0 lb

## 2022-11-12 DIAGNOSIS — Z20828 Contact with and (suspected) exposure to other viral communicable diseases: Secondary | ICD-10-CM

## 2022-11-12 DIAGNOSIS — R6889 Other general symptoms and signs: Secondary | ICD-10-CM

## 2022-11-12 DIAGNOSIS — R059 Cough, unspecified: Secondary | ICD-10-CM

## 2022-11-12 DIAGNOSIS — Z20822 Contact with and (suspected) exposure to covid-19: Secondary | ICD-10-CM | POA: Diagnosis not present

## 2022-11-12 DIAGNOSIS — J988 Other specified respiratory disorders: Secondary | ICD-10-CM

## 2022-11-12 LAB — POC COVID19 BINAXNOW: SARS Coronavirus 2 Ag: NEGATIVE

## 2022-11-12 LAB — POCT INFLUENZA A/B
Influenza A, POC: POSITIVE — AB
Influenza B, POC: NEGATIVE

## 2022-11-12 LAB — POCT RESPIRATORY SYNCYTIAL VIRUS: RSV Rapid Ag: NEGATIVE

## 2022-11-12 MED ORDER — OSELTAMIVIR PHOSPHATE 75 MG PO CAPS: 75.0000 mg | ORAL_CAPSULE | Freq: Two times a day (BID) | ORAL | 0 refills | Status: DC

## 2022-11-12 MED ORDER — ALBUTEROL SULFATE HFA 108 (90 BASE) MCG/ACT IN AERS: 2.0000 | INHALATION_SPRAY | Freq: Four times a day (QID) | RESPIRATORY_TRACT | 1 refills | Status: DC | PRN

## 2022-11-12 NOTE — Progress Notes (Signed)
Subjective:     Patient ID: Ariel Ayala, female   DOB: [birth year], 46 y.o.   MRN: [id number]  This visit type was conducted due to national recommendations for restrictions regarding the COVID-19 Pandemic (e.g. social distancing) in an effort to limit this patient's exposure and mitigate transmission in our community.  Due to their co-morbid illnesses, this patient is at least at moderate risk for complications without adequate follow up.  This format is felt to be most appropriate for this patient at this time.    Documentation for virtual audio and video telecommunications through [place] encounter:  The patient was located at home. The provider was located in the office. The patient did consent to this visit and is aware of possible charges through their insurance for this visit.  The other persons participating in this telemedicine service were none. Time spent on call was 20 minutes and in review of previous records 20 minutes total.  This virtual service is not related to other E/M service within previous 7 days.   HPI Chief Complaint  Patient presents with   other    ST, cough, fever, mild congestion, stuck above chest, tested negative for covid Sunday night, started feeling bad on Christmas, rt. Ear fills full   Virtual for illness.   Sympotms came on suddenly yesterday, with sweats, fever subjective, sore throat, some cough, dry cough, no body aches or chills.  Just got back from a cruise 11/09/22, and all of her friends that went seem to be sick, some had flu, some had flu, some with RSV.    Did covid test 2 nights ago, negative for covid.  Feels mucous and sickness in throat nad upper chest.   Has hx/o asthma but no currently SOB or wheezing.  Right ear feels like there is fluid.  Used some mucinex last night.  No other aggravating or relieving factors. No other complaint.  Past Medical History:  Diagnosis Date   Asthma    CMV (cytomegalovirus infection) (HCC)     Splenomegaly    on CT   Vitamin D deficiency 10/25/2020   Current Outpatient Medications on File Prior to Visit  Medication Sig Dispense Refill   loratadine (CLARITIN) 10 MG tablet Take 10 mg by mouth daily as needed for allergies.     ALPRAZolam (XANAX) 0.25 MG tablet Take 1 tablet (0.25 mg total) by mouth 2 (two) times daily as needed for anxiety. (Patient not taking: Reported on 11/12/2022) 20 tablet 0   No current facility-administered medications on file prior to visit.    Review of Systems As in subjective    Objective:   Physical Exam Due to coronavirus pandemic stay at home measures, patient visit was virtual and they were not examined in person.   Ht 5\' 1"  (1.549 m)   Wt 147 lb (66.7 kg)   BMI 27.78 kg/m   Gen: wd, wn, nad Pleasant, answers questions appropriately, mildly ill appearing No labored breathing or wheezing      Assessment:     Encounter Diagnoses  Name Primary?   Respiratory tract infection Yes   Flu-like symptoms    Cough, unspecified type    Exposure to influenza    Exposure to COVID-19 virus        Plan:     We discussed symptoms, concerns, likely influenza.  She had a negative COVID test but has been on a cruise in the last week around positive contacts for RSV flu and COVID  I recommend  rest, hydration throughout the day, you can use Tylenol and ibuprofen alternating for fever and aches and not feeling well.  I recommend emergenC vitamin pack over-the-counter with extra vitamins C, D and zinc  We discussed testing.  She will drive out to her back parking lot for testing now   Addendum: Positive sore flu, negative for RSV and COVID on swabs today   Prescription given for Tamiflu, discussed risks/benefits of medication.    Discussed diagnosis of influenza. Discussed supportive care including rest, hydration, OTC Tylenol or NSAID for fever, aches, and malaise.  Discussed period of contagion, self quarantine at home away from others to  avoid spread of disease, discussed means of transmission, and possible complications including pneumonia.  If worse or not improving within the next 4-5 days, then call or return.  Patient voiced understanding of diagnosis, recommendations, and treatment plan.    [name] was seen today for other.  Diagnoses and all orders for this visit:  Respiratory tract infection  Flu-like symptoms  Cough, unspecified type  Exposure to influenza  Exposure to COVID-19 virus  Other orders -     albuterol (VENTOLIN HFA) 108 (90 Base) MCG/ACT inhaler; Inhale 2 puffs into the lungs every 6 (six) hours as needed for wheezing or shortness of breath. -     oseltamivir (TAMIFLU) 75 MG capsule; Take 1 capsule (75 mg total) by mouth 2 (two) times daily.  Follow-up in our back parking lot for testing today  Follow up if worse or not improving in the next few days

## 2022-11-12 NOTE — Progress Notes (Signed)
You are positive for flu.  Begin Tamiflu to help cut down on the severity and length of symptoms.  Rest, hydrate well, you can continue ibuprofen and Tylenol for fever and not feeling well.  Consider some extra vitamins over-the-counter such as emergenc.  Symptoms generally worse the first 3 to 4 days.  I would stay away from people the next few days since you are contagious.  Once you have no fever and and feel significantly improved he could be back around people but I would quarantine at least 3 days away from folks

## 2022-11-20 ENCOUNTER — Other Ambulatory Visit: Payer: Self-pay | Admitting: Medical

## 2022-11-20 MED ORDER — MECLIZINE HCL 25 MG PO TABS: 25.0000 mg | ORAL_TABLET | Freq: Two times a day (BID) | ORAL | 0 refills | Status: DC

## 2022-11-26 ENCOUNTER — Ambulatory Visit: Payer: Managed Care, Other (non HMO) | Admitting: Medical

## 2022-11-26 ENCOUNTER — Encounter: Payer: Self-pay | Admitting: Medical

## 2022-11-26 VITALS — BP 120/80 | HR 80 | Temp 98.4°F | Wt 144.6 lb

## 2022-11-26 DIAGNOSIS — H6503 Acute serous otitis media, bilateral: Secondary | ICD-10-CM | POA: Diagnosis not present

## 2022-11-26 NOTE — Progress Notes (Signed)
Subjective:  Ariel Ayala is a 47 y.o. female who presents for Chief Complaint  Patient presents with   other    Had the flu christmas got over those symptoms but ears still fill full, and had some dizziness, both ears fill stuffed up,      Here for ear fullness.  Since having flu recently, ears feel clogged up, not clear, interfering with hearing.   Bending over intensifies symptoms . Has some vertigo sensation as well.  No sore throat, no cough, no fever, no thick nasal discharge.  Has tried meclizine, sudafed, no improvement thought.  No other aggravating or relieving factors.    No other c/o.  The following portions of the patient's history were reviewed and updated as appropriate: allergies, current medications, past family history, past medical history, past social history, past surgical history and problem list.  ROS Otherwise as in subjective above  Objective: BP 120/80   Pulse 80   Temp 98.4 F (36.9 C)   Wt 144 lb 9.6 oz (65.6 kg)   BMI 27.32 kg/m   General appearance: alert, no distress, well developed, well nourished HEENT: normocephalic, sclerae anicteric, conjunctiva pink and moist, air fluid levels bilat, nares patent, no discharge or erythema, pharynx normal Oral cavity: MMM, no lesions   Assessment: Encounter Diagnosis  Name Primary?   Non-recurrent acute serous otitis media of both ears Yes     Plan: Discussed symptoms and concerns, treatment recommendations as below  Patient Instructions  Serous otitis media/fluid in inner ears and sinuses  continue to hydrate well with water through the day Continue Meclizine twice daily for another week Consider adding over the counter allergy nasal spray such as Flonase or Nasacort for a week, twice daily Consider short term Afrin nasal decongestant for only 3- 4 days If not improving in the next 5 days, I can call out Prednisone and or antibiotic to see if this helps resolve tings   Ariel Ayala was seen today for  other.  Diagnoses and all orders for this visit:  Non-recurrent acute serous otitis media of both ears    Follow up: prn

## 2022-11-26 NOTE — Patient Instructions (Signed)
Serous otitis media/fluid in inner ears and sinuses  continue to hydrate well with water through the day Continue Meclizine twice daily for another week Consider adding over the counter allergy nasal spray such as Flonase or Nasacort for a week, twice daily Consider short term Afrin nasal decongestant for only 3- 4 days If not improving in the next 5 days, I can call out Prednisone and or antibiotic to see if this helps resolve tings

## 2023-01-29 DIAGNOSIS — M65941 Unspecified synovitis and tenosynovitis, right hand: Secondary | ICD-10-CM

## 2023-01-29 DIAGNOSIS — M659 Synovitis and tenosynovitis, unspecified: Secondary | ICD-10-CM | POA: Insufficient documentation

## 2023-01-29 DIAGNOSIS — M189 Osteoarthritis of first carpometacarpal joint, unspecified: Secondary | ICD-10-CM | POA: Insufficient documentation

## 2023-01-29 HISTORY — DX: Unspecified synovitis and tenosynovitis, right hand: M65.941

## 2023-03-06 ENCOUNTER — Encounter: Payer: Self-pay | Admitting: Nurse Practitioner

## 2023-03-06 ENCOUNTER — Ambulatory Visit: Payer: Managed Care, Other (non HMO) | Admitting: Nurse Practitioner

## 2023-03-06 VITALS — BP 118/80 | HR 61 | Ht 61.0 in | Wt 154.2 lb

## 2023-03-06 DIAGNOSIS — M7501 Adhesive capsulitis of right shoulder: Secondary | ICD-10-CM

## 2023-03-06 DIAGNOSIS — Z0000 Encounter for general adult medical examination without abnormal findings: Secondary | ICD-10-CM

## 2023-03-06 DIAGNOSIS — F5101 Primary insomnia: Secondary | ICD-10-CM

## 2023-03-06 DIAGNOSIS — Z1211 Encounter for screening for malignant neoplasm of colon: Secondary | ICD-10-CM | POA: Diagnosis not present

## 2023-03-06 DIAGNOSIS — E538 Deficiency of other specified B group vitamins: Secondary | ICD-10-CM

## 2023-03-06 DIAGNOSIS — N951 Menopausal and female climacteric states: Secondary | ICD-10-CM

## 2023-03-06 DIAGNOSIS — E559 Vitamin D deficiency, unspecified: Secondary | ICD-10-CM

## 2023-03-06 DIAGNOSIS — L659 Nonscarring hair loss, unspecified: Secondary | ICD-10-CM

## 2023-03-06 LAB — IRON,TIBC AND FERRITIN PANEL

## 2023-03-06 LAB — CBC WITH DIFFERENTIAL/PLATELET: Lymphs: 27 %

## 2023-03-06 LAB — TSH

## 2023-03-06 LAB — COMPREHENSIVE METABOLIC PANEL

## 2023-03-06 LAB — LIPID PANEL

## 2023-03-06 LAB — VITAMIN D 25 HYDROXY (VIT D DEFICIENCY, FRACTURES)

## 2023-03-06 NOTE — Patient Instructions (Signed)
I have sent the referral for a colonoscopy for you. They will call to set up that schedule.   I will let you know what the labs show and if we can find a cause for the hair thinning you are experiencing.   For all adult patients, I recommend A well balanced diet low in saturated fats, cholesterol, and moderation in carbohydrates.   This can be as simple as monitoring portion sizes and cutting back on sugary beverages such as soda and juice to start with.    Daily water consumption of at least 64 ounces.  Physical activity at least 180 minutes per week, if just starting out.   This can be as simple as taking the stairs instead of the elevator and walking 2-3 laps around the office  purposefully every day.   STD protection, partner selection, and regular testing if high risk.  Limited consumption of alcoholic beverages if alcohol is consumed.  For women, I recommend no more than 7 alcoholic beverages per week, spread out throughout the week.  Avoid "binge" drinking or consuming large quantities of alcohol in one setting.   Please let me know if you feel you may need help with reduction or quitting alcohol consumption.   Avoidance of nicotine, if used.  Please let me know if you feel you may need help with reduction or quitting nicotine use.   Daily mental health attention.  This can be in the form of 5 minute daily meditation, prayer, journaling, yoga, reflection, etc.   Purposeful attention to your emotions and mental state can significantly improve your overall wellbeing  and  Health.  Please know that I am here to help you with all of your health care goals and am happy to work with you to find a solution that works best for you.  The greatest advice I have received with any changes in life are to take it one step at a time, that even means if all you can focus on is the next 60 seconds, then do that and celebrate your victories.  With any changes in life, you will have set backs, and  that is OK. The important thing to remember is, if you have a set back, it is not a failure, it is an opportunity to try again!  Health Maintenance Recommendations Screening Testing Mammogram Every 1 -2 years based on history and risk factors Starting at age 15 Pap Smear Ages 21-39 every 3 years Ages 75-65 every 5 years with HPV testing More frequent testing may be required based on results and history Colon Cancer Screening Every 1-10 years based on test performed, risk factors, and history Starting at age 16 Bone Density Screening Every 2-10 years based on history Starting at age 90 for women Recommendations for men differ based on medication usage, history, and risk factors AAA Screening One time ultrasound Men 51-41 years old who have every smoked Lung Cancer Screening Low Dose Lung CT every 12 months Age 28-80 years with a 30 pack-year smoking history who still smoke or who have quit within the last 15 years  Screening Labs Routine  Labs: Complete Blood Count (CBC), Complete Metabolic Panel (CMP), Cholesterol (Lipid Panel) Every 6-12 months based on history and medications May be recommended more frequently based on current conditions or previous results Hemoglobin A1c Lab Every 3-12 months based on history and previous results Starting at age 78 or earlier with diagnosis of diabetes, high cholesterol, BMI >26, and/or risk factors Frequent monitoring for patients  with diabetes to ensure blood sugar control Thyroid Panel (TSH w/ T3 & T4) Every 6 months based on history, symptoms, and risk factors May be repeated more often if on medication HIV One time testing for all patients 46 and older May be repeated more frequently for patients with increased risk factors or exposure Hepatitis C One time testing for all patients 48 and older May be repeated more frequently for patients with increased risk factors or exposure Gonorrhea, Chlamydia Every 12 months for all sexually  active persons 13-24 years Additional monitoring may be recommended for those who are considered high risk or who have symptoms PSA Men 93-31 years old with risk factors Additional screening may be recommended from age 322-69 based on risk factors, symptoms, and history  Vaccine Recommendations Tetanus Booster All adults every 10 years Flu Vaccine All patients 6 months and older every year COVID Vaccine All patients 12 years and older Initial dosing with booster May recommend additional booster based on age and health history HPV Vaccine 2 doses all patients age 32-26 Dosing may be considered for patients over 26 Shingles Vaccine (Shingrix) 2 doses all adults 55 years and older Pneumonia (Pneumovax 23) All adults 65 years and older May recommend earlier dosing based on health history Pneumonia (Prevnar 80) All adults 65 years and older Dosed 1 year after Pneumovax 23  Additional Screening, Testing, and Vaccinations may be recommended on an individualized basis based on family history, health history, risk factors, and/or exposure.

## 2023-03-06 NOTE — Progress Notes (Signed)
Shawna Clamp, DNP, AGNP-c [facility] [address] [place], [place] [address] Main Office [phone]  BP 118/80   Pulse 61   Ht  (1.549 m)   Wt 154 lb 3.2 oz (69.9 kg)   LMP 02/02/2023   BMI 29.14 kg/m    Subjective:    Patient ID: Ariel Ayala, female    DOB: Mar 21, 1976, 47 y.o.   MRN: [id number]  HPI: Ariel Ayala is a 47 y.o. female presenting on 03/06/2023 for comprehensive medical examination.   Ariel Ayala presents today for CPE. She has additional concerns with perimenopausal symptoms including hair loss, frozen shoulder, insomnia, and low energy. She reports significant hair loss, leading her to cut six inches off in December. She denies having COVID-19 in the past year but had the flu and took Tamiflu. Ariel Ayala's menstrual cycles have been irregular, with a recent change from 21-24 days to a week longer.  Ariel Ayala has been experiencing shoulder pain due to frozen shoulder, which has limited her physical activity. She has tried dance classes but finds the pain to be constant and debilitating. She is interested in seeking an osteopathic doctor for alternative treatment options, as she is not ready for surgery.  The patient has a family history of colorectal cancer, with a cousin diagnosed in her Tamsen Reist mid-40s on her mother's side. Ariel Ayala has not had a colonoscopy or Cologuard and is a couple of years behind on these screenings.  Ariel Ayala reports occasional fluid buildup in her right ear, which is usually resolved with allergy medication. She denies any hearing problems, fullness in her throat, or difficulties swallowing.   Pertinent items are noted in HPI.  IMMUNIZATIONS:   Flu: Flu vaccine completed elsewhere this season Prevnar 13: Prevnar 13 N/A for this patient Prevnar 20: Prevnar 20 N/A for this patient Pneumovax 23: Pneumovax 23 N/A for this patient Vac Shingrix: Shingrix N/A for this patient HPV: HPV N/A for this patient Tetanus: Tetanus  declined, patient will complete at a later date COVID: COVID completed, documentation in chart   HEALTH MAINTENANCE: Pap Smear HM Status: is up to date Mammogram HM Status: is up to date Colon Cancer Screening HM Status: ordered today Bone Density HM Status: is not applicable for this patient STI Testing HM Status: was declined  Lung CT HM Status: is not applicable for this patient  She reports regular vision exams q1-5y: Yes  She reports regular dental exams q 54m:  Yes  The patient eats a regular, healthy diet. She endorses exercise and/or activity of: Sedentary- due to shoulder injury  Most Recent Depression Screen:     03/06/2023   10:21 AM 10/23/2020    1:42 PM  Depression screen PHQ 2/9  Decreased Interest 0 0  Down, Depressed, Hopeless 0 0  PHQ - 2 Score 0 0   Most Recent Anxiety Screen:      No data to display         Most Recent Fall Screen:    03/06/2023   10:20 AM 04/04/2021   10:30 AM 10/23/2020    1:41 PM  Fall Risk   Falls in the past year? 0 1 1  Number falls in past yr: 0 0 0  Injury with Fall? 0 0 0  Risk for fall due to : No Fall Risks Other (Comment)   Risk for fall due to: Comment  tripped and fell in parking lot   Follow up Falls evaluation completed Falls evaluation completed     Past  medical history, surgical history, medications, allergies, family history and social history reviewed with patient today and changes made to appropriate areas of the chart.  Past Medical History:  Past Medical History:  Diagnosis Date   Asthma    CMV (cytomegalovirus infection)    Splenomegaly    on CT   Vitamin D deficiency 10/25/2020   Medications:  Current Outpatient Medications on File Prior to Visit  Medication Sig   cetirizine (ZYRTEC) 10 MG chewable tablet Chew 10 mg by mouth daily.   ketoconazole (NIZORAL) 2 % cream Apply 1 Application topically daily.   albuterol (VENTOLIN HFA) 108 (90 Base) MCG/ACT inhaler Inhale 2 puffs into the lungs every 6 (six)  hours as needed for wheezing or shortness of breath. (Patient not taking: Reported on 11/26/2022)   ALPRAZolam (XANAX) 0.25 MG tablet Take 1 tablet (0.25 mg total) by mouth 2 (two) times daily as needed for anxiety. (Patient not taking: Reported on 11/12/2022)   loratadine (CLARITIN) 10 MG tablet Take 10 mg by mouth daily as needed for allergies.   meclizine (ANTIVERT) 25 MG tablet Take 1 tablet (25 mg total) by mouth 2 (two) times daily. (Patient not taking: Reported on 03/06/2023)   No current facility-administered medications on file prior to visit.   Surgical History:  Past Surgical History:  Procedure Laterality Date   DILATION AND CURETTAGE OF UTERUS     FOOT SURGERY Right    with hardware   trigger thumb Right    Allergies:  Allergies  Allergen Reactions   Sulfa Antibiotics Other (See Comments)    Throat swells and gets itchy   Family History:  Family History  Problem Relation Age of Onset   Diabetes Mother    Thyroid disease Mother    Suicidality Father    Colon cancer Cousin        Objective:    BP 118/80   Pulse 61   Ht 5\' 1"  (1.549 m)   Wt 154 lb 3.2 oz (69.9 kg)   LMP 02/02/2023   BMI 29.14 kg/m   Wt Readings from Last 3 Encounters:  03/06/23 154 lb 3.2 oz (69.9 kg)  11/26/22 144 lb 9.6 oz (65.6 kg)  11/12/22 147 lb (66.7 kg)    Physical Exam Vitals and nursing note reviewed.  Constitutional:      General: She is not in acute distress.    Appearance: Normal appearance.  HENT:     Head: Normocephalic and atraumatic.     Right Ear: Hearing, ear canal and external ear normal. A middle ear effusion is present.     Left Ear: Hearing, ear canal and external ear normal.     Ears:     Comments: Small amount of clear effusion with no signs of infection.     Nose: Nose normal.     Right Sinus: No maxillary sinus tenderness or frontal sinus tenderness.     Left Sinus: No maxillary sinus tenderness or frontal sinus tenderness.     Mouth/Throat:     Lips: Pink.      Mouth: Mucous membranes are moist.     Pharynx: Oropharynx is clear.  Eyes:     General: Lids are normal. Vision grossly intact.     Extraocular Movements: Extraocular movements intact.     Conjunctiva/sclera: Conjunctivae normal.     Pupils: Pupils are equal, round, and reactive to light.     Funduscopic exam:    Right eye: Red reflex present.  Left eye: Red reflex present.    Visual Fields: Right eye visual fields normal and left eye visual fields normal.  Neck:     Thyroid: No thyromegaly.     Vascular: No carotid bruit.  Cardiovascular:     Rate and Rhythm: Normal rate and regular rhythm.     Chest Wall: PMI is not displaced.     Pulses: Normal pulses.          Dorsalis pedis pulses are 2+ on the right side and 2+ on the left side.       Posterior tibial pulses are 2+ on the right side and 2+ on the left side.     Heart sounds: Normal heart sounds. No murmur heard. Pulmonary:     Effort: Pulmonary effort is normal. No respiratory distress.     Breath sounds: Normal breath sounds.  Abdominal:     General: Abdomen is flat. Bowel sounds are normal. There is no distension.     Palpations: Abdomen is soft. There is no hepatomegaly, splenomegaly or mass.     Tenderness: There is no abdominal tenderness. There is no right CVA tenderness, left CVA tenderness, guarding or rebound.  Musculoskeletal:        General: Normal range of motion.     Cervical back: Full passive range of motion without pain, normal range of motion and neck supple. No tenderness.     Right lower leg: No edema.     Left lower leg: No edema.  Feet:     Left foot:     Toenail Condition: Left toenails are normal.  Lymphadenopathy:     Cervical: No cervical adenopathy.     Upper Body:     Right upper body: No supraclavicular adenopathy.     Left upper body: No supraclavicular adenopathy.  Skin:    General: Skin is warm and dry.     Capillary Refill: Capillary refill takes less than 2 seconds.      Nails: There is no clubbing.  Neurological:     General: No focal deficit present.     Mental Status: She is alert and oriented to person, place, and time.     GCS: GCS eye subscore is 4. GCS verbal subscore is 5. GCS motor subscore is 6.     Sensory: Sensation is intact.     Motor: Motor function is intact.     Coordination: Coordination is intact.     Gait: Gait is intact.     Deep Tendon Reflexes: Reflexes are normal and symmetric.  Psychiatric:        Attention and Perception: Attention normal.        Mood and Affect: Mood normal.        Speech: Speech normal.        Behavior: Behavior normal. Behavior is cooperative.        Thought Content: Thought content normal.        Cognition and Memory: Cognition and memory normal.        Judgment: Judgment normal.     Results for orders placed or performed in visit on 03/06/23  Hemoglobin A1c  Result Value Ref Range   Hgb A1c MFr Bld 5.2 4.8 - 5.6 %   Est. average glucose Bld gHb Est-mCnc 103 mg/dL  Vitamin Z61  Result Value Ref Range   Vitamin B-12 518 232 - 1,245 pg/mL  CBC with Differential/Platelet  Result Value Ref Range   WBC 9.1 3.4 - 10.8 x10E3/uL  RBC 4.62 3.77 - 5.28 x10E6/uL   Hemoglobin 12.4 11.1 - 15.9 g/dL   Hematocrit 16.1 09.6 - 46.6 %   MCV 81 79 - 97 fL   MCH 26.8 26.6 - 33.0 pg   MCHC 33.2 31.5 - 35.7 g/dL   RDW 04.5 40.9 - 81.1 %   Platelets 417 150 - 450 x10E3/uL   Neutrophils 66 Not Estab. %   Lymphs 27 Not Estab. %   Monocytes 5 Not Estab. %   Eos 1 Not Estab. %   Basos 1 Not Estab. %   Neutrophils Absolute 6.0 1.4 - 7.0 x10E3/uL   Lymphocytes Absolute 2.5 0.7 - 3.1 x10E3/uL   Monocytes Absolute 0.5 0.1 - 0.9 x10E3/uL   EOS (ABSOLUTE) 0.1 0.0 - 0.4 x10E3/uL   Basophils Absolute 0.1 0.0 - 0.2 x10E3/uL   Immature Granulocytes 0 Not Estab. %   Immature Grans (Abs) 0.0 0.0 - 0.1 x10E3/uL  Comprehensive metabolic panel  Result Value Ref Range   Glucose 84 70 - 99 mg/dL   BUN 11 6 - 24 mg/dL    Creatinine, Ser 9.14 0.57 - 1.00 mg/dL   eGFR 782 >95 AO/ZHY/8.65   BUN/Creatinine Ratio 17 9 - 23   Sodium 140 134 - 144 mmol/L   Potassium 4.3 3.5 - 5.2 mmol/L   Chloride 103 96 - 106 mmol/L   CO2 20 20 - 29 mmol/L   Calcium 9.2 8.7 - 10.2 mg/dL   Total Protein 7.4 6.0 - 8.5 g/dL   Albumin 4.3 3.9 - 4.9 g/dL   Globulin, Total 3.1 1.5 - 4.5 g/dL   Albumin/Globulin Ratio 1.4 1.2 - 2.2   Bilirubin Total 0.4 0.0 - 1.2 mg/dL   Alkaline Phosphatase 64 44 - 121 IU/L   AST 13 0 - 40 IU/L   ALT 7 0 - 32 IU/L  Iron, TIBC and Ferritin Panel  Result Value Ref Range   Total Iron Binding Capacity 452 (H) 250 - 450 ug/dL   UIBC 784 696 - 295 ug/dL   Iron 59 27 - 284 ug/dL   Iron Saturation 13 (L) 15 - 55 %   Ferritin 6 (L) 15 - 150 ng/mL  T4, free  Result Value Ref Range   Free T4 1.13 0.82 - 1.77 ng/dL  Lipid panel  Result Value Ref Range   Cholesterol, Total 258 (H) 100 - 199 mg/dL   Triglycerides 132 0 - 149 mg/dL   HDL 76 >44 mg/dL   VLDL Cholesterol Cal 25 5 - 40 mg/dL   LDL Chol Calc (NIH) 010 (H) 0 - 99 mg/dL   Chol/HDL Ratio 3.4 0.0 - 4.4 ratio  TSH  Result Value Ref Range   TSH 2.800 0.450 - 4.500 uIU/mL  VITAMIN D 25 Hydroxy (Vit-D Deficiency, Fractures)  Result Value Ref Range   Vit D, 25-Hydroxy 31.8 30.0 - 100.0 ng/mL  Testosterone, Total, LC/MS/MS  Result Value Ref Range   Testosterone, total 22.1 ng/dL         Assessment & Plan:   Problem List Items Addressed This Visit     Encounter for annual physical exam - Primary    CPE today.  Labs pending. Will make changes as necessary based on results.  Review of HM activities and recommendations discussed and provided on AVS Anticipatory guidance, diet, and exercise recommendations provided.  Medications, allergies, and hx reviewed and updated as necessary.  Plan to f/u with CPE in 1 year or sooner for acute/chronic health needs as directed.  Relevant Orders   Ambulatory referral to Gastroenterology    Hemoglobin A1c (Completed)   Vitamin B12 (Completed)   CBC with Differential/Platelet (Completed)   Comprehensive metabolic panel (Completed)   Iron, TIBC and Ferritin Panel (Completed)   T4, free (Completed)   Lipid panel (Completed)   TSH (Completed)   VITAMIN D 25 Hydroxy (Vit-D Deficiency, Fractures) (Completed)   Testosterone, Total, LC/MS/MS (Completed)   Adhesive capsulitis of right shoulder    The patient reports significant pain and limited range of motion. Unclear if this is related to hormonal changes or simply age.  Plan: - Provided the patient with information on an osteopathic physician (Dr. Jules Schick) for a second opinion. - Encourage the patient to continue with physical therapy and consider other non-surgical options before resorting to surgery.      Hair loss   Relevant Orders   Ambulatory referral to Gastroenterology   Hemoglobin A1c (Completed)   Vitamin B12 (Completed)   CBC with Differential/Platelet (Completed)   Comprehensive metabolic panel (Completed)   Iron, TIBC and Ferritin Panel (Completed)   T4, free (Completed)   Lipid panel (Completed)   TSH (Completed)   VITAMIN D 25 Hydroxy (Vit-D Deficiency, Fractures) (Completed)   Testosterone, Total, LC/MS/MS (Completed)   Perimenopause    The patient reports hair loss, frozen shoulder, insomnia, and low energy. She is also experiencing menstrual changes. Suspect perimenopause is present.  Plan: - Check thyroid function tests (TSH, free T4), B12, vitamin D, CBC, CMP, and ferritin levels. - Recommend maintaining a healthy diet with adequate protein and B vitamins. - Consider referral to dermatology if hair loss persists or worsens.      Relevant Orders   Ambulatory referral to Gastroenterology   Hemoglobin A1c (Completed)   Vitamin B12 (Completed)   CBC with Differential/Platelet (Completed)   Comprehensive metabolic panel (Completed)   Iron, TIBC and Ferritin Panel (Completed)   T4, free  (Completed)   Lipid panel (Completed)   TSH (Completed)   VITAMIN D 25 Hydroxy (Vit-D Deficiency, Fractures) (Completed)   Testosterone, Total, LC/MS/MS (Completed)   Primary insomnia   Relevant Orders   Ambulatory referral to Gastroenterology   Hemoglobin A1c (Completed)   Vitamin B12 (Completed)   CBC with Differential/Platelet (Completed)   Comprehensive metabolic panel (Completed)   Iron, TIBC and Ferritin Panel (Completed)   T4, free (Completed)   Lipid panel (Completed)   TSH (Completed)   VITAMIN D 25 Hydroxy (Vit-D Deficiency, Fractures) (Completed)   Testosterone, Total, LC/MS/MS (Completed)   Vitamin D deficiency   Relevant Orders   Ambulatory referral to Gastroenterology   Hemoglobin A1c (Completed)   Vitamin B12 (Completed)   CBC with Differential/Platelet (Completed)   Comprehensive metabolic panel (Completed)   Iron, TIBC and Ferritin Panel (Completed)   T4, free (Completed)   Lipid panel (Completed)   TSH (Completed)   VITAMIN D 25 Hydroxy (Vit-D Deficiency, Fractures) (Completed)   Testosterone, Total, LC/MS/MS (Completed)   Other Visit Diagnoses     Health care maintenance       Relevant Orders   Ambulatory referral to Gastroenterology   Hemoglobin A1c (Completed)   Vitamin B12 (Completed)   CBC with Differential/Platelet (Completed)   Comprehensive metabolic panel (Completed)   Iron, TIBC and Ferritin Panel (Completed)   T4, free (Completed)   Lipid panel (Completed)   TSH (Completed)   VITAMIN D 25 Hydroxy (Vit-D Deficiency, Fractures) (Completed)   Testosterone, Total, LC/MS/MS (Completed)   Screening for colon cancer  Relevant Orders   Ambulatory referral to Gastroenterology   B12 deficiency       Relevant Orders   Ambulatory referral to Gastroenterology   Hemoglobin A1c (Completed)   Vitamin B12 (Completed)   CBC with Differential/Platelet (Completed)   Comprehensive metabolic panel (Completed)   Iron, TIBC and Ferritin Panel  (Completed)   T4, free (Completed)   Lipid panel (Completed)   TSH (Completed)   VITAMIN D 25 Hydroxy (Vit-D Deficiency, Fractures) (Completed)   Testosterone, Total, LC/MS/MS (Completed)          Follow up plan: Return in about 1 year (around 03/05/2024) for CPE.  NEXT PREVENTATIVE PHYSICAL DUE IN 1 YEAR.  PATIENT COUNSELING PROVIDED FOR ALL ADULT PATIENTS: A well balanced diet low in saturated fats, cholesterol, and moderation in carbohydrates.  This can be as simple as monitoring portion sizes and cutting back on sugary beverages such as soda and juice to start with.    Daily water consumption of at least 64 ounces.  Physical activity at least 180 minutes per week.  If just starting out, start 10 minutes a day and work your way up.   This can be as simple as taking the stairs instead of the elevator and walking 2-3 laps around the office  purposefully every day.   STD protection, partner selection, and regular testing if high risk.  Limited consumption of alcoholic beverages if alcohol is consumed. For men, I recommend no more than 14 alcoholic beverages per week, spread out throughout the week (max 2 per day). Avoid "binge" drinking or consuming large quantities of alcohol in one setting.  Please let me know if you feel you may need help with reduction or quitting alcohol consumption.   Avoidance of nicotine, if used. Please let me know if you feel you may need help with reduction or quitting nicotine use.   Daily mental health attention. This can be in the form of 5 minute daily meditation, prayer, journaling, yoga, reflection, etc.  Purposeful attention to your emotions and mental state can significantly improve your overall wellbeing  and  Health.  Please know that I am here to help you with all of your health care goals and am happy to work with you to find a solution that works best for you.  The greatest advice I have received with any changes in life are to take it  one step at a time, that even means if all you can focus on is the next 60 seconds, then do that and celebrate your victories.  With any changes in life, you will have set backs, and that is OK. The important thing to remember is, if you have a set back, it is not a failure, it is an opportunity to try again! Screening Testing Mammogram Every 1 -2 years based on history and risk factors Starting at age 60 Pap Smear Ages 21-39 every 3 years Ages 16-65 every 5 years with HPV testing More frequent testing may be required based on results and history Colon Cancer Screening Every 1-10 years based on test performed, risk factors, and history Starting at age 30 Bone Density Screening Every 2-10 years based on history Starting at age 78 for women Recommendations for men differ based on medication usage, history, and risk factors AAA Screening One time ultrasound Men 80-24 years old who have every smoked Lung Cancer Screening Low Dose Lung CT every 12 months Age 44-80 years with a 30 pack-year smoking history who still smoke or who  have quit within the last 15 years   Screening Labs Routine  Labs: Complete Blood Count (CBC), Complete Metabolic Panel (CMP), Cholesterol (Lipid Panel) Every 6-12 months based on history and medications May be recommended more frequently based on current conditions or previous results Hemoglobin A1c Lab Every 3-12 months based on history and previous results Starting at age 45 or earlier with diagnosis of diabetes, high cholesterol, BMI >26, and/or risk factors Frequent monitoring for patients with diabetes to ensure blood sugar control Thyroid Panel (TSH) Every 6 months based on history, symptoms, and risk factors May be repeated more often if on medication HIV One time testing for all patients 78 and older May be repeated more frequently for patients with increased risk factors or exposure Hepatitis C One time testing for all patients 75 and older May be  repeated more frequently for patients with increased risk factors or exposure Gonorrhea, Chlamydia Every 12 months for all sexually active persons 13-24 years Additional monitoring may be recommended for those who are considered high risk or who have symptoms Every 12 months for any woman on birth control, regardless of sexual activity PSA Men 79-25 years old with risk factors Additional screening may be recommended from age 21-69 based on risk factors, symptoms, and history  Vaccine Recommendations Tetanus Booster All adults every 10 years Flu Vaccine All patients 6 months and older every year COVID Vaccine All patients 12 years and older Initial dosing with booster May recommend additional booster based on age and health history HPV Vaccine 2 doses all patients age 53-26 Dosing may be considered for patients over 26 Shingles Vaccine (Shingrix) 2 doses all adults 55 years and older Pneumonia (Pneumovax 23) All adults 65 years and older May recommend earlier dosing based on health history One year apart from Prevnar 13 Pneumonia (Prevnar 47) All adults 65 years and older Dosed 1 year after Pneumovax 23 Pneumonia (Prevnar 20) One time alternative to the two dosing of 13 and 23 For all adults with initial dose of 23, 20 is recommended 1 year later For all adults with initial dose of 13, 23 is still recommended as second option 1 year later

## 2023-03-07 LAB — CBC WITH DIFFERENTIAL/PLATELET
Basos: 1 %
EOS (ABSOLUTE): 0.1 10*3/uL (ref 0.0–0.4)
Hematocrit: 37.3 % (ref 34.0–46.6)
Hemoglobin: 12.4 g/dL (ref 11.1–15.9)
Lymphocytes Absolute: 2.5 10*3/uL (ref 0.7–3.1)
MCV: 81 fL (ref 79–97)
Neutrophils Absolute: 6 10*3/uL (ref 1.4–7.0)

## 2023-03-07 LAB — LIPID PANEL
Cholesterol, Total: 258 mg/dL — ABNORMAL HIGH (ref 100–199)
HDL: 76 mg/dL (ref >39)
LDL Chol Calc (NIH): 157 mg/dL — ABNORMAL HIGH (ref 0–99)

## 2023-03-07 LAB — COMPREHENSIVE METABOLIC PANEL
BUN: 11 mg/dL (ref 6–24)
Calcium: 9.2 mg/dL (ref 8.7–10.2)
Chloride: 103 mmol/L (ref 96–106)
Potassium: 4.3 mmol/L (ref 3.5–5.2)

## 2023-03-07 LAB — T4, FREE: Free T4: 1.13 ng/dL (ref 0.82–1.77)

## 2023-03-10 LAB — VITAMIN B12: Vitamin B-12: 518 pg/mL (ref 232–1245)

## 2023-03-10 LAB — COMPREHENSIVE METABOLIC PANEL
ALT: 7 IU/L (ref 0–32)
Albumin: 4.3 g/dL (ref 3.9–4.9)
BUN/Creatinine Ratio: 17 (ref 9–23)
Bilirubin Total: 0.4 mg/dL (ref 0.0–1.2)
Creatinine, Ser: 0.64 mg/dL (ref 0.57–1.00)
Glucose: 84 mg/dL (ref 70–99)
Total Protein: 7.4 g/dL (ref 6.0–8.5)
eGFR: 110 mL/min/{1.73_m2} (ref >59)

## 2023-03-10 LAB — CBC WITH DIFFERENTIAL/PLATELET
Basophils Absolute: 0.1 10*3/uL (ref 0.0–0.2)
Eos: 1 %
Immature Grans (Abs): 0 10*3/uL (ref 0.0–0.1)
Immature Granulocytes: 0 %
MCH: 26.8 pg (ref 26.6–33.0)
MCHC: 33.2 g/dL (ref 31.5–35.7)
Monocytes Absolute: 0.5 10*3/uL (ref 0.1–0.9)
Monocytes: 5 %
Neutrophils: 66 %
Platelets: 417 10*3/uL (ref 150–450)
RBC: 4.62 x10E6/uL (ref 3.77–5.28)
RDW: 13.7 % (ref 11.7–15.4)
WBC: 9.1 10*3/uL (ref 3.4–10.8)

## 2023-03-10 LAB — LIPID PANEL
Triglycerides: 142 mg/dL (ref 0–149)
VLDL Cholesterol Cal: 25 mg/dL (ref 5–40)

## 2023-03-10 LAB — IRON,TIBC AND FERRITIN PANEL
Iron Saturation: 13 % — ABNORMAL LOW (ref 15–55)
UIBC: 393 ug/dL (ref 131–425)

## 2023-03-10 LAB — HEMOGLOBIN A1C
Est. average glucose Bld gHb Est-mCnc: 103 mg/dL
Hgb A1c MFr Bld: 5.2 % (ref 4.8–5.6)

## 2023-03-10 LAB — TESTOSTERONE, TOTAL, LC/MS/MS: Testosterone, total: 22.1 ng/dL

## 2023-03-12 ENCOUNTER — Other Ambulatory Visit: Payer: Self-pay

## 2023-03-12 DIAGNOSIS — L659 Nonscarring hair loss, unspecified: Secondary | ICD-10-CM

## 2023-03-12 DIAGNOSIS — N951 Menopausal and female climacteric states: Secondary | ICD-10-CM | POA: Insufficient documentation

## 2023-03-12 HISTORY — DX: Nonscarring hair loss, unspecified: L65.9

## 2023-03-12 MED ORDER — ROSUVASTATIN CALCIUM 5 MG PO TABS: 5.0000 mg | ORAL_TABLET | Freq: Every day | ORAL | 0 refills | Status: DC

## 2023-03-12 NOTE — Assessment & Plan Note (Signed)
The patient reports significant pain and limited range of motion. Unclear if this is related to hormonal changes or simply age.  Plan: - Provided the patient with information on an osteopathic physician (Dr. Jules Schick) for a second opinion. - Encourage the patient to continue with physical therapy and consider other non-surgical options before resorting to surgery.

## 2023-03-12 NOTE — Assessment & Plan Note (Signed)
CPE today. Labs pending. Will make changes as necessary based on results.  Review of HM activities and recommendations discussed and provided on AVS Anticipatory guidance, diet, and exercise recommendations provided.  Medications, allergies, and hx reviewed and updated as necessary.  Plan to f/u with CPE in 1 year or sooner for acute/chronic health needs as directed.   

## 2023-03-12 NOTE — Assessment & Plan Note (Signed)
The patient reports hair loss, frozen shoulder, insomnia, and low energy. She is also experiencing menstrual changes. Suspect perimenopause is present.  Plan: - Check thyroid function tests (TSH, free T4), B12, vitamin D, CBC, CMP, and ferritin levels. - Recommend maintaining a healthy diet with adequate protein and B vitamins. - Consider referral to dermatology if hair loss persists or worsens.

## 2023-03-25 ENCOUNTER — Encounter: Payer: Self-pay | Admitting: Nurse Practitioner

## 2023-06-07 ENCOUNTER — Other Ambulatory Visit: Payer: Self-pay | Admitting: Nurse Practitioner

## 2023-06-12 ENCOUNTER — Ambulatory Visit: Payer: Managed Care, Other (non HMO) | Admitting: Nurse Practitioner

## 2023-06-12 ENCOUNTER — Encounter: Payer: Self-pay | Admitting: Nurse Practitioner

## 2023-06-12 VITALS — BP 120/74 | HR 70 | Wt 156.4 lb

## 2023-06-12 DIAGNOSIS — E782 Mixed hyperlipidemia: Secondary | ICD-10-CM

## 2023-06-12 DIAGNOSIS — J4520 Mild intermittent asthma, uncomplicated: Secondary | ICD-10-CM | POA: Diagnosis not present

## 2023-06-12 DIAGNOSIS — E611 Iron deficiency: Secondary | ICD-10-CM | POA: Diagnosis not present

## 2023-06-12 NOTE — Patient Instructions (Signed)
I recommend taking Ferrous Sulfate 325mg  once a week to help build up your iron stores so that you don't have issues with low hemoglobin in the future. I won't recheck this today, but we can check with your next physical to make sure you are building a good amount of stores.   I have rechecked your cholesterol today. We will make sure this looks ok and that the medication is doing what we need it to do.

## 2023-06-12 NOTE — Progress Notes (Signed)
  Shawna Clamp, DNP, AGNP-c [facility]  [address] [place], [place] [address] [phone]  ESTABLISHED PATIENT- Chronic Health and/or Follow-Up Visit  Blood pressure 120/74, pulse 70, weight 156 lb 6.4 oz (70.9 kg).    Ariel Ayala is a 47 y.o. year old female presenting today for evaluation and management of chronic conditions.   Iron Stores - At her last labs she had very low ferritin levels, but normal hemoglobin. She has not had this checked prior to the most recent labs, therefore, she is unsure if this has been present before.  She denies heavy periods or bloody/dark stools. She is a vegetarian, therefore, her diet may be lower in iron rich foods.   Cholesterol She has started taking the rosuvastatin and has not had any negative side effects. She consumes low cholesterol foods  All ROS negative with exception of what is listed above.   PHYSICAL EXAM Physical Exam Vitals and nursing note reviewed.  Constitutional:      Appearance: Normal appearance.  HENT:     Head: Normocephalic.  Eyes:     Pupils: Pupils are equal, round, and reactive to light.  Cardiovascular:     Rate and Rhythm: Normal rate and regular rhythm.     Pulses: Normal pulses.     Heart sounds: Normal heart sounds.  Pulmonary:     Effort: Pulmonary effort is normal.     Breath sounds: Normal breath sounds.  Musculoskeletal:        General: Normal range of motion.     Cervical back: Normal range of motion.  Skin:    General: Skin is warm.  Neurological:     General: No focal deficit present.     Mental Status: She is alert and oriented to person, place, and time.  Psychiatric:        Mood and Affect: Mood normal.     PLAN Problem List Items Addressed This Visit     Asthma    Well controlled with no alarm symptoms. In the setting of low iron, close monitoring recommended to ensure that oxygenation is appropriate in the event of flair.       Iron deficiency     Recent labs showed iron deficiency without anemia. No clear etiology at this time aside from vegetarian diet. Recommendations for increased iron rich foods and consideration of ferrous sulfate 325mg  every other day. We will continue to monitor.       Mixed hyperlipidemia - Primary    Recent start of rosuvastatin. She is tolerating this medication well without side effects. Suspect familial component given her overall healthy, low fat diet. Will monitor liver and lipids today.       Relevant Orders   CMP14+EGFR (Completed)   Lipid panel (Completed)    Return in about 1 year (around 06/11/2024) for CPE.  Time: 18 minutes, >50% spent counseling, care coordination, chart review, and documentation.   Shawna Clamp, DNP, AGNP-c

## 2023-06-25 ENCOUNTER — Ambulatory Visit: Payer: Managed Care, Other (non HMO) | Admitting: Family Medicine

## 2023-06-26 ENCOUNTER — Emergency Department (HOSPITAL_COMMUNITY): Payer: Managed Care, Other (non HMO)

## 2023-06-26 ENCOUNTER — Other Ambulatory Visit: Payer: Self-pay

## 2023-06-26 ENCOUNTER — Emergency Department (HOSPITAL_COMMUNITY)
Admission: EM | Admit: 2023-06-26 | Discharge: 2023-06-26 | Disposition: A | Discharge status: Home / Self Care | Payer: Managed Care, Other (non HMO) | Attending: Emergency Medicine | Admitting: Emergency Medicine

## 2023-06-26 DIAGNOSIS — R109 Unspecified abdominal pain: Secondary | ICD-10-CM | POA: Insufficient documentation

## 2023-06-26 DIAGNOSIS — J189 Pneumonia, unspecified organism: Secondary | ICD-10-CM | POA: Insufficient documentation

## 2023-06-26 DIAGNOSIS — R0602 Shortness of breath: Secondary | ICD-10-CM | POA: Diagnosis present

## 2023-06-26 DIAGNOSIS — Z20822 Contact with and (suspected) exposure to covid-19: Secondary | ICD-10-CM | POA: Insufficient documentation

## 2023-06-26 LAB — CBC
HCT: 39.5 % (ref 36.0–46.0)
Hemoglobin: 13.1 g/dL (ref 12.0–15.0)
MCH: 27.3 pg (ref 26.0–34.0)
MCHC: 33.2 g/dL (ref 30.0–36.0)
MCV: 82.5 fL (ref 80.0–100.0)
Platelets: 375 10*3/uL (ref 150–400)
RBC: 4.79 MIL/uL (ref 3.87–5.11)
RDW: 14.3 % (ref 11.5–15.5)
WBC: 14.1 10*3/uL — ABNORMAL HIGH (ref 4.0–10.5)
nRBC: 0 % (ref 0.0–0.2)

## 2023-06-26 LAB — URINALYSIS, ROUTINE W REFLEX MICROSCOPIC
Bilirubin Urine: NEGATIVE
Glucose, UA: NEGATIVE mg/dL
Hgb urine dipstick: NEGATIVE
Ketones, ur: NEGATIVE mg/dL
Nitrite: NEGATIVE
Protein, ur: 30 mg/dL — AB
Specific Gravity, Urine: 1.024 (ref 1.005–1.030)
pH: 7 (ref 5.0–8.0)

## 2023-06-26 LAB — HCG, SERUM, QUALITATIVE: Preg, Serum: NEGATIVE

## 2023-06-26 LAB — TROPONIN I (HIGH SENSITIVITY)
Troponin I (High Sensitivity): 3 ng/L (ref <18)
Troponin I (High Sensitivity): 3 ng/L (ref <18)

## 2023-06-26 LAB — BASIC METABOLIC PANEL
Anion gap: 13 (ref 5–15)
BUN: 13 mg/dL (ref 6–20)
CO2: 23 mmol/L (ref 22–32)
Calcium: 9.2 mg/dL (ref 8.9–10.3)
Chloride: 101 mmol/L (ref 98–111)
Creatinine, Ser: 0.79 mg/dL (ref 0.44–1.00)
GFR, Estimated: >60 mL/min (ref >60)
Glucose, Bld: 115 mg/dL — ABNORMAL HIGH (ref 70–99)
Potassium: 4.1 mmol/L (ref 3.5–5.1)
Sodium: 137 mmol/L (ref 135–145)

## 2023-06-26 LAB — HEPATIC FUNCTION PANEL
ALT: 12 U/L (ref 0–44)
AST: 16 U/L (ref 15–41)
Albumin: 3.4 g/dL — ABNORMAL LOW (ref 3.5–5.0)
Alkaline Phosphatase: 50 U/L (ref 38–126)
Bilirubin, Direct: 0.2 mg/dL (ref 0.0–0.2)
Indirect Bilirubin: 0.5 mg/dL (ref 0.3–0.9)
Total Bilirubin: 0.7 mg/dL (ref 0.3–1.2)
Total Protein: 7.2 g/dL (ref 6.5–8.1)

## 2023-06-26 LAB — LIPASE, BLOOD: Lipase: 30 U/L (ref 11–51)

## 2023-06-26 LAB — SARS CORONAVIRUS 2 BY RT PCR: SARS Coronavirus 2 by RT PCR: NEGATIVE

## 2023-06-26 MED ORDER — LEVOFLOXACIN 500 MG PO TABS: 500.0000 mg | ORAL_TABLET | Freq: Every day | ORAL | 0 refills | Status: DC

## 2023-06-26 MED ORDER — IOHEXOL 350 MG/ML SOLN
75.0000 mL | Freq: Once | INTRAVENOUS | Status: AC | PRN
  Administered 2023-06-26: 75 mL via INTRAVENOUS

## 2023-06-26 MED ORDER — ONDANSETRON HCL 4 MG/2ML IJ SOLN
4.0000 mg | Freq: Once | INTRAMUSCULAR | Status: AC
  Administered 2023-06-26: 4 mg via INTRAVENOUS
  Filled 2023-06-26: qty 2

## 2023-06-26 MED ORDER — SODIUM CHLORIDE 0.9 % IV SOLN
1.0000 g | Freq: Once | INTRAVENOUS | Status: AC
  Administered 2023-06-26: 1 g via INTRAVENOUS
  Filled 2023-06-26: qty 10

## 2023-06-26 MED ORDER — ALUM & MAG HYDROXIDE-SIMETH 200-200-20 MG/5ML PO SUSP
30.0000 mL | Freq: Once | ORAL | Status: AC
  Administered 2023-06-26: 30 mL via ORAL
  Filled 2023-06-26: qty 30

## 2023-06-26 NOTE — ED Provider Notes (Signed)
[place] EMERGENCY DEPARTMENT AT [facility] Provider Note   CSN: [id number] Arrival date & time: 06/26/23  4098     History  Chief Complaint  Patient presents with   Chest Pain   Shortness of Breath   Fever    Ariel Ayala is a 47 y.o. female.  HPI 47 year old female presents today with chest pain and dyspnea.  She had UTI symptoms that began on Monday.  She was started on Macrodantin on Tuesday.  She took 3 doses.  She began having chest pain and shortness of breath yesterday.  She did not take her Macrobid last night.  Last night during the night she had shaking chills and subjective fever.  She had discomfort in her chest that she describes as indigestion and burning.  She has some associated dyspnea.  She has not had any cough.  Nausea with no vomiting.  She is continue to have some burning but feels like most of her UTI symptoms are improved.  Patient has implanted birth control and reports regular and normal menses essure Home Medications Prior to Admission medications   Medication Sig Start Date End Date Taking? Authorizing Provider  Acetaminophen (TYLENOL DISSOLVE PACKS) 500 MG PACK Take 1,000 mg by mouth 2 (two) times daily as needed (pain, fever, headache).   Yes [provider]  cetirizine (ZYRTEC) 10 MG tablet Take 10 mg by mouth daily as needed for allergies.   Yes [provider]  Cholecalciferol (VITAMIN D-3 PO) Take 1 capsule by mouth daily.   Yes [provider]  Cyanocobalamin (VITAMIN B-12 PO) Take 1 tablet by mouth daily.   Yes [provider]  ibuprofen (ADVIL) 200 MG tablet Take 400 mg by mouth 2 (two) times daily as needed for headache or moderate pain.   Yes [provider]  levofloxacin (LEVAQUIN) 500 MG tablet Take 1 tablet (500 mg total) by mouth daily. 06/26/23  Yes Margarita Grizzle, MD  Multiple Vitamin (MULTIVITAMIN) tablet Take 1 tablet by mouth daily.   Yes [provider]  naproxen  sodium (ALEVE) 220 MG tablet Take 220 mg by mouth daily as needed (pain, headache).   Yes [provider]  rosuvastatin (CRESTOR) 5 MG tablet TAKE 1 TABLET (5 MG TOTAL) BY MOUTH DAILY. Patient taking differently: Take 5 mg by mouth at bedtime. 06/09/23  Yes Early, Sung Amabile, NP      Allergies    Sulfa antibiotics and Macrobid [nitrofurantoin]    Review of Systems   Review of Systems  Physical Exam Updated Vital Signs BP 117/74   Pulse 81   Temp 98.9 F (37.2 C) (Oral)   Resp (!) 21   Ht 1.549 m (5\' 1" )   Wt 70.3 kg   SpO2 98%   BMI 29.29 kg/m  Physical Exam Vitals and nursing note reviewed.  Constitutional:      Appearance: She is normal weight.  HENT:     Head: Normocephalic.  Eyes:     Pupils: Pupils are equal, round, and reactive to light.  Cardiovascular:     Rate and Rhythm: Normal rate and regular rhythm.     Heart sounds: Normal heart sounds.  Pulmonary:     Effort: Pulmonary effort is normal.     Breath sounds: Normal breath sounds.  Abdominal:     General: Bowel sounds are normal.     Palpations: Abdomen is soft.     Tenderness: There is abdominal tenderness.     Comments: Lower abdominal  tenderness to palpation which is diffuse without masses or rebound  Musculoskeletal:        General: Normal range of motion.     Cervical back: Normal range of motion.     Right lower leg: No edema.     Left lower leg: No edema.  Skin:    General: Skin is warm and dry.     Capillary Refill: Capillary refill takes less than 2 seconds.  Neurological:     General: No focal deficit present.     Mental Status: She is alert.     ED Results / Procedures / Treatments   Labs (all labs ordered are listed, but only abnormal results are displayed) Labs Reviewed  BASIC METABOLIC PANEL - Abnormal; Notable for the following components:      Result Value   Glucose, Bld 115 (*)    All other components within normal limits  CBC - Abnormal; Notable for the following  components:   WBC 14.1 (*)    All other components within normal limits  HEPATIC FUNCTION PANEL - Abnormal; Notable for the following components:   Albumin 3.4 (*)    All other components within normal limits  URINALYSIS, ROUTINE W REFLEX MICROSCOPIC - Abnormal; Notable for the following components:   Color, Urine AMBER (*)    APPearance CLOUDY (*)    Protein, ur 30 (*)    Leukocytes,Ua SMALL (*)    Bacteria, UA RARE (*)    All other components within normal limits  SARS CORONAVIRUS 2 BY RT PCR  HCG, SERUM, QUALITATIVE  LIPASE, BLOOD  TROPONIN I (HIGH SENSITIVITY)  TROPONIN I (HIGH SENSITIVITY)    EKG EKG Interpretation Date/Time:  Thursday June 26 2023 09:13:27 EDT Ventricular Rate:  86 PR Interval:  136 QRS Duration:  74 QT Interval:  366 QTC Calculation: 437 R Axis:   32  Text Interpretation: Normal sinus rhythm Non-specific ST-t changes inferior and lateral leads No old tracing to compare Confirmed by Margarita Grizzle [phone]) on 06/26/2023 9:33:13 AM  Radiology CT ABDOMEN PELVIS W CONTRAST  Result Date: 06/26/2023 CLINICAL DATA:  47 year old female with UTI recently treated with antibiotics. Chest pain and shortness of breath. New fever and tremor. EXAM: CT ABDOMEN AND PELVIS WITH CONTRAST TECHNIQUE: Multidetector CT imaging of the abdomen and pelvis was performed using the standard protocol following bolus administration of intravenous contrast. RADIATION DOSE REDUCTION: This exam was performed according to the departmental dose-optimization program which includes automated exposure control, adjustment of the mA and/or kV according to patient size and/or use of iterative reconstruction technique. CONTRAST:  75mL OMNIPAQUE IOHEXOL 350 MG/ML SOLN COMPARISON:  CT Abdomen and Pelvis 04/13/2017. FINDINGS: Lower chest: Chest CTA today reported separately. Hepatobiliary: Negative liver and gallbladder. Pancreas: Negative. Spleen: Negative. Adrenals/Urinary Tract: Normal adrenal glands.  Kidneys appears symmetric and within normal limits. No nephrolithiasis. Renal enhancement and contrast excretion appears normal bilaterally. Decompressed proximal ureters. No renal inflammation. No periureteral stranding. Diminutive and unremarkable bladder. Occasional pelvic phleboliths. Stomach/Bowel: Negative large bowel aside from mild retained stool throughout. Normal retrocecal appendix on series 3, image 59. Decompressed terminal ileum and no dilated small bowel. Decompressed stomach and duodenum. No free air, abdominal free fluid, or mesenteric inflammation. Vascular/Lymphatic: Major arterial structures in the abdomen and pelvis are patent. Mild calcified iliac artery atherosclerosis. Normal caliber abdominal aorta. Portal venous system is patent. No lymphadenopathy is identified. Reproductive: Highly heterogeneous endometrium, uterine enlargement (series 6, image 50) which appears largely new from the 2018 CT. Additionally there  is a hypodense masslike enlargement of the lower uterine segment and cervix (series 7, image 68 and series 3, image 80. No convincing parametrial inflammation. Chronic bilateral fallopian tube birth control devices and ovaries are within normal limits (physiologic). Other: Trace simple appearing pelvic free fluid could be physiologic. Musculoskeletal: Negative. IMPRESSION: 1. Abnormal uterine enlargement and heterogeneity, new since 2018. This might be fibroid disease but cannot exclude endometrial and/or cervical mass. Query abnormal uterine bleeding and recommend correlation with speculum exam. Pelvis MRI (GYN protocol without and with contrast) may be the most valuable follow-up imaging modality. 2. No other acute or inflammatory process in the abdomen or pelvis. Unremarkable urinary tract appearance. Electronically Signed   By: Odessa Fleming M.D.   On: 06/26/2023 11:38   CT Angio Chest PE W and/or Wo Contrast  Result Date: 06/26/2023 CLINICAL DATA:  Pulmonary embolism suspected,  low to intermediate probability. Chest pain and shortness of breath after starting antibiotic for urinary tract infection. EXAM: CT ANGIOGRAPHY CHEST WITH CONTRAST TECHNIQUE: Multidetector CT imaging of the chest was performed using the standard protocol during bolus administration of intravenous contrast. Multiplanar CT image reconstructions and MIPs were obtained to evaluate the vascular anatomy. RADIATION DOSE REDUCTION: This exam was performed according to the departmental dose-optimization program which includes automated exposure control, adjustment of the mA and/or kV according to patient size and/or use of iterative reconstruction technique. CONTRAST:  75mL OMNIPAQUE IOHEXOL 350 MG/ML SOLN COMPARISON:  One view chest x-Dorea Duff 06/26/2023 FINDINGS: Cardiovascular: The heart size is normal. The aorta and great vessel origins are within normal limits. Pulmonary artery opacification is excellent. No focal filling defects are present to suggest pulmonary embolus. Pulmonary artery size is normal. Mediastinum/Nodes: No enlarged mediastinal, hilar, or axillary lymph nodes. Thyroid gland, trachea, and esophagus demonstrate no significant findings. Lungs/Pleura: Patchy ground-glass attenuation is present in the lungs bilaterally. No significant airspace consolidation is present. No nodule or mass lesion is present. No significant pleural disease is present. Upper Abdomen: Limited imaging the abdomen is unremarkable. There is no significant adenopathy. No solid organ lesions are present. Musculoskeletal: No chest wall abnormality. No acute or significant osseous findings. Review of the MIP images confirms the above findings. IMPRESSION: 1. No pulmonary embolus. 2. Patchy ground-glass attenuation in the lungs bilaterally. This is nonspecific, but can be seen in the setting of atypical infection or edema. 3. No other acute or focal lesion to explain the patient's symptoms. Electronically Signed   By: Marin Roberts  M.D.   On: 06/26/2023 11:28   DG Chest Port 1 View  Result Date: 06/26/2023 CLINICAL DATA:  Chest pain, dyspnea EXAM: PORTABLE CHEST 1 VIEW COMPARISON:  04/13/2017 FINDINGS: Cardiac size is within normal limits. There are no signs of pulmonary edema or focal pulmonary consolidation. There is subtle increased density in left lower lung field. There is minimal blunting of lateral CP angles. There is no pneumothorax. IMPRESSION: There are no signs of alveolar pulmonary edema or focal pulmonary consolidation. There is subtle increased density in the left lower lung field which may be due to poor inspiration and confluence of soft tissues of chest wall over the lung field. Less likely possibility would be an early infiltrate in left lower lung field. There is minimal blunting of lateral CP angles suggesting minimal pleural effusions or pleural thickening. Electronically Signed   By: Ernie Avena M.D.   On: 06/26/2023 10:02    Procedures Procedures    Medications Ordered in ED Medications  ondansetron (ZOFRAN) injection 4  mg (4 mg Intravenous Given 06/26/23 1027)  alum & mag hydroxide-simeth (MAALOX/MYLANTA) 200-200-20 MG/5ML suspension 30 mL (30 mLs Oral Given 06/26/23 1026)  cefTRIAXone (ROCEPHIN) 1 g in sodium chloride 0.9 % 100 mL IVPB (0 g Intravenous Stopped 06/26/23 1226)  iohexol (OMNIPAQUE) 350 MG/ML injection 75 mL (75 mLs Intravenous Contrast Given 06/26/23 1119)    ED Course/ Medical Decision Making/ A&P Clinical Course as of 06/26/23 1439  Thu Jun 26, 2023  1032 Chest x-Jasten Guyette reviewed interpreted with no definite acute abnormalities Radiologist interpretation notes possible subtle early infiltrates in the lower lobes [DR]  1042 Lipase reviewed interpreted and normal at 30 LFTs reviewed interpreted within normal limits First troponin reviewed interpreted and normal Basic metabolic panel reviewed interpreted significant for mild hyperglycemia at 115 Pregnancy test is negative CBC  reviewed interpreted significant for mild leukocytosis white count 14,000 otherwise within normal limits Urinalysis reviewed and interpreted significant for 6-10 red blood cells, 11-20 white blood cells and rare bacteria with squamous epithelial cells present [DR]  1343 T angio chest with no pulmonary embolism but the patchy groundglass attenuation in the lungs bilaterally may be atypical infection [DR]  1343 CT abdomen pelvis with abnormal uterine enlargement which may be fibroid disease and will need GYN follow-up No other acute or inflammatory process in the abdomen or pelvis [DR]    Clinical Course User Index [DR] Margarita Grizzle, MD                                 Medical Decision Making Amount and/or Complexity of Data Reviewed Labs: ordered. Radiology: ordered.  Risk OTC drugs. Prescription drug management.   47 year old female with recent treatment for UTI and question of reaction to this presents today with burning chest pain.  She is evaluated here with EKG, cardiac enzymes, and imaging.  Chest x-Kennith Morss with some abnormalities in her lower lobes.  This was then evaluated with CT of her chest due to these findings and CT of the abdomen due to the recent urinary tract infection symptoms.  Chest x-Luellen Howson shows some bilateral basilar groundglass opacities consistent with atypical pneumonia.  Patient is being treated with Rocephin here will be given Levaquin as an outpatient for monotherapy CT was obtained and no evidence of UTI was found however, she does have a enlarged uterus.  I have discussed these findings with the patient and she will follow-up with her GYN.  She is not having any atypical bleeding at this time. We have discussed return precautions and need for follow-up and she voices understanding.       Final Clinical Impression(s) / ED Diagnoses Final diagnoses:  Community acquired pneumonia, unspecified laterality    Rx / DC Orders ED Discharge Orders          Ordered     levofloxacin (LEVAQUIN) 500 MG tablet  Daily        06/26/23 1437              Margarita Grizzle, MD 06/26/23 1439

## 2023-06-26 NOTE — ED Notes (Signed)
Transported to CT 

## 2023-06-26 NOTE — Discharge Instructions (Addendum)
Take antibiotics as prescribed If you are worse at any time including increased pain, shortness of breath, or other new symptoms Please follow-up with your primary care doctor for Discussed in 3 to 4 days Please follow-up with your GYN for abnormal findings on CT scan of pelvis as Discussed

## 2023-06-26 NOTE — ED Triage Notes (Signed)
Pt. Stated, I had a UTI , and  treated with antibiotic, Microbid on Tuesday morning, and it must be the medication , Ive had chest pain and SOB I stopped taking the medication yesterday morning so I only had 3 doses. Last night I had a fever and shaking and having indigestion. I called the Dr. This morning.

## 2023-06-26 NOTE — ED Notes (Signed)
Got patient undressed into a gown placed patient on the monitor patient is resting with family at bedside and call bell in reach

## 2023-06-30 ENCOUNTER — Encounter: Payer: Self-pay | Admitting: Family Medicine

## 2023-06-30 ENCOUNTER — Ambulatory Visit: Payer: Managed Care, Other (non HMO) | Admitting: Family Medicine

## 2023-06-30 VITALS — BP 118/74 | HR 67 | Temp 98.4°F | Wt 153.4 lb

## 2023-06-30 DIAGNOSIS — K219 Gastro-esophageal reflux disease without esophagitis: Secondary | ICD-10-CM | POA: Diagnosis not present

## 2023-06-30 DIAGNOSIS — R0789 Other chest pain: Secondary | ICD-10-CM | POA: Diagnosis not present

## 2023-06-30 DIAGNOSIS — J189 Pneumonia, unspecified organism: Secondary | ICD-10-CM

## 2023-06-30 DIAGNOSIS — J302 Other seasonal allergic rhinitis: Secondary | ICD-10-CM

## 2023-06-30 NOTE — Patient Instructions (Signed)
  Your exam is fairly normal today. I do see some evidence of allergies. Your lungs are clear. Complete the 7 day course of levaquin. Consider also taking guaifenesin (plain mucinex, or other) This would help for any thick mucus, or any chest congestion, whether that be from the lung infection or allergies and postnasal drainage. Consider restarting antihistamines.  Belching and burning in the chest could suggest reflux. Since you have famotidine at home, I suggest taking it twice daily, about 30 minutes before breakfast and dinner. If this helps some, but not enough, you can change to prilosec OTC (omeprazole 20mg ), and take that once or twice daily for up to 2 weeks. If you have ongoing symptoms, please return.  I'm entering an order for a repeat chest x-ray.  You should put this on your calendar to go to [address] [facility]) for a follow-up x-ray. This should be done in about 4 weeks.

## 2023-06-30 NOTE — Progress Notes (Signed)
Chief Complaint  Patient presents with   Hospitalization Follow-up    Chest pain x different than ER pain x weakness, elephant on chest, feels like chest is cold. Taking antibiotics x 3 pills left.   Patient presents for ER follow-up.  Went to ER 8/8-- with chest pain, burning.  She had been diagnosed with UTI earlier that week, and put on macrodantin. She had a lot of abdominal cramping which she associated with the antibiotic. She had developed fever and chills on 8/7, along with chest discomfort and fatigue. She did not have cough. She was treated with zofran, maalox/mylantia, and 1 gram of rocephin, and discharged on levaquin x 7d for suspected pneumonia.  EKG, cardiac enzymes, COVID, lipase, LFT's, hcg and b-met were normal. WBC was elevated Lab Results  Component Value Date   WBC 14.1 (H) 06/26/2023   HGB 13.1 06/26/2023   HCT 39.5 06/26/2023   MCV 82.5 06/26/2023   PLT 375 06/26/2023   Imaging done: 8/8 CXR: IMPRESSION: There are no signs of alveolar pulmonary edema or focal pulmonary consolidation.   There is subtle increased density in the left lower lung field which may be due to poor inspiration and confluence of soft tissues of chest wall over the lung field. Less likely possibility would be an early infiltrate in left lower lung field. There is minimal blunting of lateral CP angles suggesting minimal pleural effusions or pleural thickening.  8/8 CT abd/pelvis: IMPRESSION: 1. Abnormal uterine enlargement and heterogeneity, new since 2018. This might be fibroid disease but cannot exclude endometrial and/or cervical mass. Query abnormal uterine bleeding and recommend correlation with speculum exam. Pelvis MRI (GYN protocol without and with contrast) may be the most valuable follow-up imaging modality.  2. No other acute or inflammatory process in the abdomen or pelvis. Unremarkable urinary tract appearance.  She states she is scheduled to see GYN next week.  8/8 CT  angio: IMPRESSION: 1. No pulmonary embolus. 2. Patchy ground-glass attenuation in the lungs bilaterally. This is nonspecific, but can be seen in the setting of atypical infection or edema. 3. No other acute or focal lesion to explain the patient's symptoms.   Today she reports feeling a little better. "Weirder" sensation in chest--still has pressure, like an elephant, but now it feels cool/cold in the chest (rather than burning or hot). Initially the burning in her chest felt like "heartburn x 8000". Denies shortness of breath, doesn't feel like asthma.  She feels very fatigued, had to lay down after doing the dishes. No DOE. Never had any cough. Last fever was 8/7 and 8/8.  Has been burping a lot this week.  Concerned about the sudden onset--Wednesday early evening is when fever, chest pain started [place] since then, not terrible diet before that (does like lemonade). No spicy food, no change in diet.   PMH, PSH, SH reviewed  Works from home, Systems analyst. Worked 1/2 day today, requesting 1/2 days for longer, since so fatigued.  Outpatient Encounter Medications as of 06/30/2023  Medication Sig Note   Cholecalciferol (VITAMIN D-3 PO) Take 1 capsule by mouth daily.    Cyanocobalamin (VITAMIN B-12 PO) Take 1 tablet by mouth daily.    levofloxacin (LEVAQUIN) 500 MG tablet Take 1 tablet (500 mg total) by mouth daily.    Multiple Vitamin (MULTIVITAMIN) tablet Take 1 tablet by mouth daily.    rosuvastatin (CRESTOR) 5 MG tablet TAKE 1 TABLET (5 MG TOTAL) BY MOUTH DAILY. (Patient taking differently: Take 5 mg by mouth  at bedtime.)    Acetaminophen (TYLENOL DISSOLVE PACKS) 500 MG PACK Take 1,000 mg by mouth 2 (two) times daily as needed (pain, fever, headache). (Patient not taking: Reported on 06/30/2023) 06/30/2023: Last taken friday   cetirizine (ZYRTEC) 10 MG tablet Take 10 mg by mouth daily as needed for allergies. (Patient not taking: Reported on 06/30/2023)    ibuprofen (ADVIL)  200 MG tablet Take 400 mg by mouth 2 (two) times daily as needed for headache or moderate pain. (Patient not taking: Reported on 06/30/2023)    naproxen sodium (ALEVE) 220 MG tablet Take 220 mg by mouth daily as needed (pain, headache). (Patient not taking: Reported on 06/30/2023)    No facility-administered encounter medications on file as of 06/30/2023.   Allergies  Allergen Reactions   Sulfa Antibiotics Itching and Swelling    Throat swelling   Macrobid [Nitrofurantoin] Nausea And Vomiting and Other (See Comments)    Abdominal pain    ROS: No further fever, no chills. Fatigue per HPI, and chest discomfort. No n/v/d. No SE from ABX. No vaginal discharge or itching. Urinary symptoms resolved. Abdominal discomfort has improved (she thinks pain/cramping was due to ABX). No rashes, bleeding, bruising. Large bruise on L forearm from IV. On menstrual cycle now. See HPI    PHYSICAL EXAM:  BP 118/74   Pulse 67   Temp 98.4 F (36.9 C)   Wt 153 lb 6.4 oz (69.6 kg)   LMP 06/30/2023   SpO2 98%   BMI 28.98 kg/m   Wt Readings from Last 3 Encounters:  06/30/23 153 lb 6.4 oz (69.6 kg)  06/26/23 155 lb (70.3 kg)  06/12/23 156 lb 6.4 oz (70.9 kg)    Well-appearing, pleasant female, in good spirits. She is in no distress, speaking easily and comfortably. No coughing during visit HEENT: conjunctiva and sclera are clear, EOMI. Nose mild-mod mucosal edema with clear mucus. Sinuses are nontender. OP clear Neck: no lymphadenopathy or mass Heart: regular rate and rhythm, no murmur Chest wall: nontender Lungs: clear bilaterally. No wheezes, rales, ronchi Abdomen: soft, nontender, no organomegaly or mass Extremities:  no edema, normal pulses Psych: normal mood, affect, hygiene and grooming Neuro: alert and oriented, cranial nerves grossly intact, normal gait.    ASSESSMENT/PLAN:  Pneumonia due to infectious organism, unspecified laterality, unspecified part of lung - some abnl on CXR  and CT which could indicate infection/pneumonia. Complete levaquin. repeat CXR 4 weeks - Plan: DG Chest 2 View  Seasonal allergies - PND may contribute to some chest congestion. anthistamines and mucinex  Chest pressure - Ddx reviewed; cardiac and PE r/o.  Treating for pneumonia.  May have component of GERD--discussed - Plan: DG Chest 2 View  Gastroesophageal reflux disease, unspecified whether esophagitis present - discused proper diet, dosing for famotidine, vs switch to prilosec up to BID if needed for up to 2 weeks.  Zyrtec, mucinex Complete 7d course of levaquin F/u CXR 4 weeks  Note for work--seen today for illness.  1/2 days of work Mon/Tues/Wed, return to full time on Thursday.   I spent 42 minutes dedicated to the care of this patient, including pre-visit review of records, face to face time, post-visit ordering of testing and documentation.  Your exam is fairly normal today. I do see some evidence of allergies. Your lungs are clear. Complete the 7 day course of levaquin. Consider also taking guaifenesin (plain mucinex, or other) This would help for any thick mucus, or any chest congestion, whether that be from the lung  infection or allergies and postnasal drainage. Consider restarting antihistamines.  Belching and burning in the chest could suggest reflux. Since you have famotidine at home, I suggest taking it twice daily, about 30 minutes before breakfast and dinner. If this helps some, but not enough, you can change to prilosec OTC (omeprazole 20mg ), and take that once or twice daily for up to 2 weeks. If you have ongoing symptoms, please return. I'm entering an order for a repeat chest x-ray.  You should put this on your calendar to go to [address] [facility]) for a follow-up x-ray. This should be done in about 4 weeks.

## 2023-07-02 ENCOUNTER — Encounter: Payer: Self-pay | Admitting: Nurse Practitioner

## 2023-07-02 DIAGNOSIS — E611 Iron deficiency: Secondary | ICD-10-CM | POA: Insufficient documentation

## 2023-07-02 DIAGNOSIS — E782 Mixed hyperlipidemia: Secondary | ICD-10-CM | POA: Insufficient documentation

## 2023-07-02 NOTE — Assessment & Plan Note (Signed)
Recent labs showed iron deficiency without anemia. No clear etiology at this time aside from vegetarian diet. Recommendations for increased iron rich foods and consideration of ferrous sulfate 325mg  every other day. We will continue to monitor.

## 2023-07-02 NOTE — Assessment & Plan Note (Signed)
Recent start of rosuvastatin. She is tolerating this medication well without side effects. Suspect familial component given her overall healthy, low fat diet. Will monitor liver and lipids today.

## 2023-07-02 NOTE — Assessment & Plan Note (Signed)
Well controlled with no alarm symptoms. In the setting of low iron, close monitoring recommended to ensure that oxygenation is appropriate in the event of flair.

## 2023-07-03 ENCOUNTER — Inpatient Hospital Stay: Payer: Managed Care, Other (non HMO) | Admitting: Nurse Practitioner

## 2023-08-01 DIAGNOSIS — N8003 Adenomyosis of the uterus: Secondary | ICD-10-CM | POA: Insufficient documentation

## 2023-08-04 ENCOUNTER — Encounter: Payer: Self-pay | Admitting: Nurse Practitioner

## 2023-09-10 DIAGNOSIS — M7542 Impingement syndrome of left shoulder: Secondary | ICD-10-CM

## 2023-09-10 DIAGNOSIS — M25512 Pain in left shoulder: Secondary | ICD-10-CM

## 2023-09-10 HISTORY — DX: Pain in left shoulder: M25.512

## 2023-09-10 HISTORY — DX: Impingement syndrome of left shoulder: M75.42

## 2023-11-13 ENCOUNTER — Other Ambulatory Visit: Payer: Self-pay | Admitting: Obstetrics and Gynecology

## 2023-11-13 DIAGNOSIS — R928 Other abnormal and inconclusive findings on diagnostic imaging of breast: Secondary | ICD-10-CM

## 2023-11-26 ENCOUNTER — Ambulatory Visit
Admission: RE | Admit: 2023-11-26 | Discharge: 2023-11-26 | Disposition: A | Discharge status: Home / Self Care | Payer: Managed Care, Other (non HMO) | Source: Ambulatory Visit | Attending: Obstetrics and Gynecology | Admitting: Obstetrics and Gynecology

## 2023-11-26 ENCOUNTER — Other Ambulatory Visit: Payer: Self-pay | Admitting: Obstetrics and Gynecology

## 2023-11-26 DIAGNOSIS — N6489 Other specified disorders of breast: Secondary | ICD-10-CM

## 2023-11-26 DIAGNOSIS — R928 Other abnormal and inconclusive findings on diagnostic imaging of breast: Secondary | ICD-10-CM

## 2023-11-26 DIAGNOSIS — N6310 Unspecified lump in the right breast, unspecified quadrant: Secondary | ICD-10-CM

## 2023-11-28 ENCOUNTER — Ambulatory Visit
Admission: RE | Admit: 2023-11-28 | Discharge: 2023-11-28 | Disposition: A | Discharge status: Home / Self Care | Payer: Managed Care, Other (non HMO) | Source: Ambulatory Visit | Attending: Obstetrics and Gynecology | Admitting: Obstetrics and Gynecology

## 2023-11-28 ENCOUNTER — Other Ambulatory Visit: Payer: Self-pay | Admitting: Obstetrics and Gynecology

## 2023-11-28 DIAGNOSIS — N6310 Unspecified lump in the right breast, unspecified quadrant: Secondary | ICD-10-CM

## 2023-11-28 DIAGNOSIS — D0510 Intraductal carcinoma in situ of unspecified breast: Secondary | ICD-10-CM | POA: Insufficient documentation

## 2023-11-28 HISTORY — PX: BREAST BIOPSY: SHX20

## 2023-12-01 LAB — SURGICAL PATHOLOGY

## 2023-12-02 ENCOUNTER — Telehealth: Payer: Self-pay | Admitting: *Deleted

## 2023-12-02 NOTE — Telephone Encounter (Signed)
 Tried calling patient phone just rang busy, will try and email her the information and try to call back tomorrow in reference to the [facility] for 1/22

## 2023-12-02 NOTE — Telephone Encounter (Signed)
 Spoke to patient to confirm upcoming morning [facility] clinic appointment on 1/22, paperwork will be sent via mail.  Gave location and time, also informed patient that the surgeon's office would be calling as well to get information from them similar to the packet that they will be receiving so make sure to do both.  Reminded patient that all providers will be coming to the clinic to see them HERE and if they had any questions to not hesitate to reach back out to myself or their navigators.

## 2023-12-04 ENCOUNTER — Encounter: Payer: Self-pay | Admitting: *Deleted

## 2023-12-04 DIAGNOSIS — Z170 Estrogen receptor positive status [ER+]: Secondary | ICD-10-CM | POA: Insufficient documentation

## 2023-12-08 NOTE — Progress Notes (Signed)
Radiation Oncology         [phone] ________________________________  Name: Ariel Ayala        MRN: [id number]  Date of Service: 12/10/2023 DOB: 02-Sep-1976  WU:[id number], Sung Amabile, NP  Abigail Miyamoto, MD     REFERRING PHYSICIAN: Abigail Miyamoto, MD   DIAGNOSIS: There were no encounter diagnoses.   HISTORY OF PRESENT ILLNESS: Ariel Ayala is a 48 y.o. female seen in the multidisciplinary breast clinic for a new diagnosis of right breast cancer. The patient was noted to have screening detected mass in the right breast. Further diagnostic work up on 11/26/23 showed persistent focal distortion in the 12:00 position, and by ultrasound showed a mass in the 12:00 position measuring 9 mm in greatest dimension. Her axilla was negative for adenopathy. 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 She's seen today to discuss treatment recommendations of her cancer.    PREVIOUS RADIATION THERAPY: {EXAM; YES/NO:19492::"No"}   PAST MEDICAL HISTORY:  Past Medical History:  Diagnosis Date   Asthma    Bloating 10/23/2020   CMV (cytomegalovirus infection) (HCC)    Early satiety 10/23/2020   Generalized abdominal pain 10/23/2020   Hair loss 03/12/2023   Indigestion 10/23/2020   Splenomegaly    on CT   Vitamin D deficiency 10/25/2020       PAST SURGICAL HISTORY: Past Surgical History:  Procedure Laterality Date   BREAST BIOPSY Right 11/28/2023   MM RT BREAST BX W LOC DEV 1ST LESION IMAGE BX SPEC STEREO GUIDE 11/28/2023 [facility]   DILATION AND CURETTAGE OF UTERUS     FOOT SURGERY Right    with hardware   trigger thumb Right      FAMILY HISTORY:  Family History  Problem Relation Age of Onset   Diabetes Mother    Thyroid disease Mother    Suicidality Father    Breast cancer Maternal Grandmother 16   Colon cancer Cousin 48     SOCIAL HISTORY:  reports that she has never smoked.  She has never used smokeless tobacco. She reports current alcohol use. She reports that she does not use drugs. The patient is single and lives in [place]. She ***   ALLERGIES: Sulfa antibiotics and Macrobid [nitrofurantoin]   MEDICATIONS:  Current Outpatient Medications  Medication Sig Dispense Refill   Acetaminophen (TYLENOL DISSOLVE PACKS) 500 MG PACK Take 1,000 mg by mouth 2 (two) times daily as needed (pain, fever, headache). (Patient not taking: Reported on 06/30/2023)     cetirizine (ZYRTEC) 10 MG tablet Take 10 mg by mouth daily as needed for allergies. (Patient not taking: Reported on 06/30/2023)     Cholecalciferol (VITAMIN D-3 PO) Take 1 capsule by mouth daily.     Cyanocobalamin (VITAMIN B-12 PO) Take 1 tablet by mouth daily.     ibuprofen (ADVIL) 200 MG tablet Take 400 mg by mouth 2 (two) times daily as needed for headache or moderate pain. (Patient not taking: Reported on 06/30/2023)     levofloxacin (LEVAQUIN) 500 MG tablet Take 1 tablet (500 mg total) by mouth daily. 7 tablet 0   Multiple Vitamin (MULTIVITAMIN) tablet Take 1 tablet by mouth daily.     naproxen sodium (ALEVE) 220 MG tablet Take 220 mg by mouth daily as needed (pain, headache). (Patient not taking: Reported on 06/30/2023)     rosuvastatin (CRESTOR) 5 MG tablet TAKE 1 TABLET (5 MG TOTAL) BY MOUTH  DAILY. (Patient taking differently: Take 5 mg by mouth at bedtime.) 90 tablet 1   No current facility-administered medications for this visit.     REVIEW OF SYSTEMS: On review of systems, the patient reports that she is doing ***     PHYSICAL EXAM:  Wt Readings from Last 3 Encounters:  06/30/23 153 lb 6.4 oz (69.6 kg)  06/26/23 155 lb (70.3 kg)  06/12/23 156 lb 6.4 oz (70.9 kg)   Temp Readings from Last 3 Encounters:  06/30/23 98.4 F (36.9 C)  06/26/23 98.9 F (37.2 C) (Oral)  11/26/22 98.4 F (36.9 C)   BP Readings from Last 3 Encounters:  06/30/23 118/74  06/26/23 117/74  06/12/23 120/74   Pulse  Readings from Last 3 Encounters:  06/30/23 67  06/26/23 81  06/12/23 70    In general this is a well appearing *** female in no acute distress. She's alert and oriented x4 and appropriate throughout the examination. Cardiopulmonary assessment is negative for acute distress and she exhibits normal effort. Bilateral breast exam is deferred.    ECOG = ***  0 - Asymptomatic (Fully active, able to carry on all predisease activities without restriction)  1 - Symptomatic but completely ambulatory (Restricted in physically strenuous activity but ambulatory and able to carry out work of a light or sedentary nature. For example, light housework, office work)  2 - Symptomatic, <50% in bed during the day (Ambulatory and capable of all self care but unable to carry out any work activities. Up and about more than 50% of waking hours)  3 - Symptomatic, >50% in bed, but not bedbound (Capable of only limited self-care, confined to bed or chair 50% or more of waking hours)  4 - Bedbound (Completely disabled. Cannot carry on any self-care. Totally confined to bed or chair)  5 - Death   Santiago Glad MM, Creech RH, Tormey DC, et al. [phone]). "Toxicity and response criteria of the [facility]". Am. Evlyn Clines. Oncol. 5 (6): 649-55    LABORATORY DATA:  Lab Results  Component Value Date   WBC 14.1 (H) 06/26/2023   HGB 13.1 06/26/2023   HCT 39.5 06/26/2023   MCV 82.5 06/26/2023   PLT 375 06/26/2023   Lab Results  Component Value Date   NA 137 06/26/2023   K 4.1 06/26/2023   CL 101 06/26/2023   CO2 23 06/26/2023   Lab Results  Component Value Date   ALT 12 06/26/2023   AST 16 06/26/2023   ALKPHOS 50 06/26/2023   BILITOT 0.7 06/26/2023      RADIOGRAPHY: MM RT BREAST BX W LOC DEV 1ST LESION IMAGE BX SPEC STEREO GUIDE Addendum Date: 12/01/2023 ADDENDUM REPORT: 12/01/2023 12:06 ADDENDUM: Pathology revealed GRADE I INVASIVE DUCTAL CARCINOMA of the RIGHT breast, 12 o'clock, (coil  clip). This was found to be concordant by Dr. Amie Portland. Pathology results were discussed with the patient by telephone. The patient reported doing well after the biopsy with tenderness, bleeding and bruising at the site. Post biopsy instructions and care were reviewed and questions were answered. The patient was encouraged to call [facility] for any additional concerns. My direct phone number was provided. The patient was referred to [facility] on December 10, 2023. Recommendation for a bilateral breast MRI given age and heterogeneously dense breast tissue-C. Pathology results reported by Rene Kocher, RN on 12/01/2023. Electronically Signed   By: Onalee Hua  Ormond M.D.   On: 12/01/2023 12:06   Result Date: 12/01/2023 CLINICAL DATA:  Patient presents for stereotactic core needle biopsy of a right breast mass and architectural distortion. The sonographic mass noted at the time of diagnostic imaging could not be confidently reproduced. Stereotactic core needle biopsy was therefore performed targeting the distortion. EXAM: RIGHT BREAST STEREOTACTIC CORE NEEDLE BIOPSY COMPARISON:  Previous exam(s). FINDINGS: The patient and I discussed the procedure of stereotactic-guided biopsy including benefits and alternatives. We discussed the high likelihood of a successful procedure. We discussed the risks of the procedure including infection, bleeding, tissue injury, clip migration, and inadequate sampling. Informed written consent was given. The usual time out protocol was performed immediately prior to the procedure. Using sterile technique and 1% Lidocaine as local anesthetic, under stereotactic guidance, a 9 gauge vacuum assisted device was used to perform core needle biopsy of architectural distortion and associated small mass using a superior approach. Lesion quadrant: Upper inner quadrant: Near 12 o'clock. At the  conclusion of the procedure, a coil shaped tissue marker clip was deployed into the biopsy cavity. Follow-up 2-view mammogram was performed and dictated separately. IMPRESSION: Stereotactic-guided biopsy of a right breast architectural distortion and small associated mass. No apparent complications. Electronically Signed: By: Amie Portland M.D. On: 11/28/2023 13:42   MM CLIP PLACEMENT RIGHT Result Date: 11/28/2023 CLINICAL DATA:  Assess post biopsy marker clip placement following stereotactic core needle biopsy a right breast architectural distortion. EXAM: 3D DIAGNOSTIC RIGHT MAMMOGRAM POST STEREOTACTIC BIOPSY COMPARISON:  Previous exam(s). FINDINGS: 3D Mammographic images were obtained following stereotactic guided biopsy of a right breast architectural distortion with a possible associated small central mass. The coil shaped biopsy clips lies 4-5 mm posterior and slightly medial to the center of the distortion. IMPRESSION: Appropriate positioning of the coil shaped biopsy marking clip at the site of biopsy in the upper slightly inner right breast adjacent to the central aspect of the distortion. Final Assessment: Post Procedure Mammograms for Marker Placement Electronically Signed   By: Amie Portland M.D.   On: 11/28/2023 13:50   MM 3D DIAGNOSTIC MAMMOGRAM UNILATERAL RIGHT BREAST Result Date: 11/26/2023 CLINICAL DATA:  Recalled from screening for right breast distortion. EXAM: DIGITAL DIAGNOSTIC UNILATERAL RIGHT MAMMOGRAM WITH TOMOSYNTHESIS AND CAD; ULTRASOUND RIGHT BREAST LIMITED TECHNIQUE: Right digital diagnostic mammography and breast tomosynthesis was performed. The images were evaluated with computer-aided detection. ; Targeted ultrasound examination of the right breast was performed COMPARISON:  Previous exam(s). ACR Breast Density Category c: The breasts are heterogeneously dense, which may obscure small masses. FINDINGS: Persistent small focal distortion within the 12 o'clock position right breast  middle depth. Targeted ultrasound is performed, showing a small irregular hypoechoic mass right breast 12 o'clock position 2 cm from the nipple measuring 6 x 9 x 5 mm. No right axillary adenopathy. IMPRESSION: Small mass within the right breast 12 o'clock position felt to correspond with the mammographically identified distortion. RECOMMENDATION: Ultrasound-guided core needle biopsy of the mass within the right breast 12 o'clock position. Recommend attention to post clip film mammography to ensure this corresponds with the small focal area of distortion. I have discussed the findings and recommendations with the patient. If applicable, a reminder letter will be sent to the patient regarding the next appointment. BI-RADS CATEGORY  4: Suspicious. Electronically Signed   By: Annia Belt M.D.   On: 11/26/2023 10:39   [place] LIMITED ULTRASOUND INCLUDING AXILLA RIGHT BREAST Result Date: 11/26/2023 CLINICAL DATA:  Recalled from screening for right breast distortion. EXAM:  DIGITAL DIAGNOSTIC UNILATERAL RIGHT MAMMOGRAM WITH TOMOSYNTHESIS AND CAD; ULTRASOUND RIGHT BREAST LIMITED TECHNIQUE: Right digital diagnostic mammography and breast tomosynthesis was performed. The images were evaluated with computer-aided detection. ; Targeted ultrasound examination of the right breast was performed COMPARISON:  Previous exam(s). ACR Breast Density Category c: The breasts are heterogeneously dense, which may obscure small masses. FINDINGS: Persistent small focal distortion within the 12 o'clock position right breast middle depth. Targeted ultrasound is performed, showing a small irregular hypoechoic mass right breast 12 o'clock position 2 cm from the nipple measuring 6 x 9 x 5 mm. No right axillary adenopathy. IMPRESSION: Small mass within the right breast 12 o'clock position felt to correspond with the mammographically identified distortion. RECOMMENDATION: Ultrasound-guided core needle biopsy of the mass within the right breast 12  o'clock position. Recommend attention to post clip film mammography to ensure this corresponds with the small focal area of distortion. I have discussed the findings and recommendations with the patient. If applicable, a reminder letter will be sent to the patient regarding the next appointment. BI-RADS CATEGORY  4: Suspicious. Electronically Signed   By: Annia Belt M.D.   On: 11/26/2023 10:39       IMPRESSION/PLAN: 1. 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 Dr. Mitzi Hansen discusses the pathology findings and reviews the nature of early stage right breast disease. The consensus from the breast conference includes ***breast conservation with lumpectomy with  sentinel node biopsy. Depending on the size of the final tumor measurements rendered by pathology, the tumor may be tested for Oncotype Dx score to determine a role for systemic therapy. Provided that chemotherapy is not indicated, the patient's course would then be followed by external radiotherapy to the breast  to reduce risks of local recurrence. Dr. Al Pimple anticipates adjuvant antiestrogen therapy to follow. We discussed the risks, benefits, short, and long term effects of radiotherapy, as well as the curative intent, and the patient is interested in proceeding. Dr. Mitzi Hansen discusses the delivery and logistics of radiotherapy and anticipates a course of 4 or up to 6 1/2 weeks of radiotherapy. We will see her back a few weeks after surgery to discuss the simulation process and anticipate we starting radiotherapy about 4-6 weeks after surgery.  2. Possible genetic predisposition to malignancy. The patient is a candidate for genetic testing given her personal and family history. She will meet with our geneticist today in clinic.   In a visit lasting *** minutes, greater than 50% of the time was spent face to face reviewing her case, as well as in preparation of, discussing, and coordinating the patient's  care.  The above documentation reflects my direct findings during this shared patient visit. Please see the separate note by Dr. Mitzi Hansen on this date for the remainder of the patient's plan of care.    Osker Mason, [facility]    **Disclaimer: This note was dictated with voice recognition software. Similar sounding words can inadvertently be transcribed and this note may contain transcription errors which may not have been corrected upon publication of note.**

## 2023-12-10 ENCOUNTER — Encounter: Payer: Self-pay | Admitting: Physical Therapy

## 2023-12-10 ENCOUNTER — Inpatient Hospital Stay: Payer: Managed Care, Other (non HMO) | Admitting: Licensed Clinical Social Worker

## 2023-12-10 ENCOUNTER — Encounter: Payer: Self-pay | Admitting: Genetic Counselor

## 2023-12-10 ENCOUNTER — Inpatient Hospital Stay: Payer: Managed Care, Other (non HMO) | Attending: Hematology and Oncology | Admitting: Hematology and Oncology

## 2023-12-10 ENCOUNTER — Encounter: Payer: Self-pay | Admitting: *Deleted

## 2023-12-10 ENCOUNTER — Ambulatory Visit
Admission: RE | Admit: 2023-12-10 | Discharge: 2023-12-10 | Disposition: A | Discharge status: Home / Self Care | Payer: Managed Care, Other (non HMO) | Source: Ambulatory Visit | Attending: Radiation Oncology | Admitting: Radiation Oncology

## 2023-12-10 ENCOUNTER — Other Ambulatory Visit: Payer: Self-pay | Admitting: Surgery

## 2023-12-10 ENCOUNTER — Other Ambulatory Visit: Payer: Self-pay

## 2023-12-10 ENCOUNTER — Ambulatory Visit: Payer: Managed Care, Other (non HMO) | Attending: Surgery | Admitting: Physical Therapy

## 2023-12-10 ENCOUNTER — Other Ambulatory Visit: Payer: Self-pay | Admitting: *Deleted

## 2023-12-10 ENCOUNTER — Inpatient Hospital Stay: Payer: Managed Care, Other (non HMO)

## 2023-12-10 ENCOUNTER — Inpatient Hospital Stay: Payer: Managed Care, Other (non HMO) | Admitting: Genetic Counselor

## 2023-12-10 VITALS — BP 130/82 | HR 86 | Temp 99.1°F | Resp 18 | Ht 61.0 in | Wt 159.6 lb

## 2023-12-10 DIAGNOSIS — Z170 Estrogen receptor positive status [ER+]: Secondary | ICD-10-CM

## 2023-12-10 DIAGNOSIS — C50411 Malignant neoplasm of upper-outer quadrant of right female breast: Secondary | ICD-10-CM

## 2023-12-10 DIAGNOSIS — G8929 Other chronic pain: Secondary | ICD-10-CM | POA: Insufficient documentation

## 2023-12-10 DIAGNOSIS — Z800 Family history of malignant neoplasm of digestive organs: Secondary | ICD-10-CM

## 2023-12-10 DIAGNOSIS — M25611 Stiffness of right shoulder, not elsewhere classified: Secondary | ICD-10-CM | POA: Insufficient documentation

## 2023-12-10 DIAGNOSIS — M25511 Pain in right shoulder: Secondary | ICD-10-CM | POA: Diagnosis not present

## 2023-12-10 DIAGNOSIS — Z803 Family history of malignant neoplasm of breast: Secondary | ICD-10-CM | POA: Diagnosis not present

## 2023-12-10 DIAGNOSIS — Z853 Personal history of malignant neoplasm of breast: Secondary | ICD-10-CM

## 2023-12-10 DIAGNOSIS — R293 Abnormal posture: Secondary | ICD-10-CM | POA: Diagnosis not present

## 2023-12-10 LAB — CBC WITH DIFFERENTIAL (CANCER CENTER ONLY)
Abs Immature Granulocytes: 0.03 10*3/uL (ref 0.00–0.07)
Basophils Absolute: 0 10*3/uL (ref 0.0–0.1)
Basophils Relative: 0 %
Eosinophils Absolute: 0.1 10*3/uL (ref 0.0–0.5)
Eosinophils Relative: 1 %
HCT: 37.1 % (ref 36.0–46.0)
Hemoglobin: 11.9 g/dL — ABNORMAL LOW (ref 12.0–15.0)
Immature Granulocytes: 0 %
Lymphocytes Relative: 27 %
Lymphs Abs: 2.7 10*3/uL (ref 0.7–4.0)
MCH: 25.6 pg — ABNORMAL LOW (ref 26.0–34.0)
MCHC: 32.1 g/dL (ref 30.0–36.0)
MCV: 80 fL (ref 80.0–100.0)
Monocytes Absolute: 0.5 10*3/uL (ref 0.1–1.0)
Monocytes Relative: 5 %
Neutro Abs: 6.7 10*3/uL (ref 1.7–7.7)
Neutrophils Relative %: 67 %
Platelet Count: 441 10*3/uL — ABNORMAL HIGH (ref 150–400)
RBC: 4.64 MIL/uL (ref 3.87–5.11)
RDW: 14.5 % (ref 11.5–15.5)
WBC Count: 10.2 10*3/uL (ref 4.0–10.5)
nRBC: 0 % (ref 0.0–0.2)

## 2023-12-10 LAB — CMP (CANCER CENTER ONLY)
ALT: 6 U/L (ref 0–44)
AST: 10 U/L — ABNORMAL LOW (ref 15–41)
Albumin: 4.1 g/dL (ref 3.5–5.0)
Alkaline Phosphatase: 57 U/L (ref 38–126)
Anion gap: 8 (ref 5–15)
BUN: 12 mg/dL (ref 6–20)
CO2: 28 mmol/L (ref 22–32)
Calcium: 9.5 mg/dL (ref 8.9–10.3)
Chloride: 105 mmol/L (ref 98–111)
Creatinine: 0.67 mg/dL (ref 0.44–1.00)
GFR, Estimated: >60 mL/min (ref >60)
Glucose, Bld: 112 mg/dL — ABNORMAL HIGH (ref 70–99)
Potassium: 3.2 mmol/L — ABNORMAL LOW (ref 3.5–5.1)
Sodium: 141 mmol/L (ref 135–145)
Total Bilirubin: 0.4 mg/dL (ref 0.0–1.2)
Total Protein: 7.4 g/dL (ref 6.5–8.1)

## 2023-12-10 LAB — RESEARCH LABS

## 2023-12-10 LAB — GENETIC SCREENING ORDER

## 2023-12-10 NOTE — Assessment & Plan Note (Signed)
Invasive Ductal Carcinoma Low grade, estrogen and progesterone positive, HER2 negative, with a proliferation index of 5%. Discussed the nature of the cancer, its origin, and the implications of its hormone receptor status. -Plan for lumpectomy as the first step in treatment. -Post-surgery, perform Oncotype DX test to further characterize the cancer and guide treatment decisions. -If Oncotype score is low, proceed to radiation therapy. If score is high, consider chemotherapy. -Consider participation in the Ofset trial if Oncotype score falls in the gray zone. -After radiation or chemotherapy, initiate antiestrogen therapy with Tamoxifen 20mg  daily. -Plan to monitor for side effects of Tamoxifen, including blood clots and endometrial hyperplasia/endometrial malignancy.  Dense Breasts Noted on examination. -Plan for MRI to better visualize the breast tissue due to the density.  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 Continue PT as advised.  Family History of Rectal Cancer Noted in the patient's maternal cousin. Genetic testing recommended.  Follow-up Plan to meet again after surgery to discuss the results and next steps in treatment.

## 2023-12-10 NOTE — Research (Addendum)
Trial: [employer] 2021-05 - Specimen Collection Study to Evaluate Biomarkers in Subjects with Cancer    Patient Ariel Ayala was identified by Dr. Al Pimple as a potential candidate for the above listed study.  This Clinical Research Nurse met with Ariel Ayala, [id number], on 12/10/23 in a manner and location that ensures patient privacy to discuss participation in the above listed research study.  Patient is Accompanied by her cousin .  A copy of the informed consent document with embedded HIPAA language was provided to the patient.  Patient reads, speaks, and understands [place].    Patient was provided with the business card of this Nurse and encouraged to contact the research team with any questions.  Patient was provided the option of taking informed consent documents home to review and was encouraged to review at their convenience with their support network, including other care providers. Patient is comfortable with making a decision regarding study participation today.  As outlined in the informed consent form, this Nurse and Ariel Ayala discussed the purpose of the research study, the investigational nature of the study, study procedures and requirements for study participation, potential risks and benefits of study participation, as well as alternatives to participation. This study is not blinded. The patient understands participation is voluntary and they may withdraw from study participation at any time.  This study does not involve randomization.  This study does not involve an investigational drug or device. This study does not involve a placebo. Patient understands enrollment is pending full eligibility review.   Confidentiality and how the patient's information will be used as part of study participation were discussed.  Patient was informed there is reimbursement provided for their time and effort spent on trial participation.  The patient is encouraged to discuss research  study participation with their insurance provider to determine what costs they may incur as part of study participation, including research related injury.    All questions were answered to patient's satisfaction.  The informed consent with embedded HIPAA language was reviewed page by page.  The patient's mental and emotional status is appropriate to provide informed consent, and the patient verbalizes an understanding of study participation.  Patient has agreed to participate in the above listed research study and has voluntarily signed the informed consent IRB approved version 01 Dec 2020, revised 17 Dec 2021 with embedded HIPAA language, on 12/10/23 at 1PM.  The patient was provided with a copy of the signed informed consent form with embedded HIPAA language for their reference.  No study specific procedures were obtained prior to the signing of the informed consent document.  Approximately 20 minutes were spent with the patient reviewing the informed consent documents.  Patient was not requested to complete a Release of Information form.   Eligibility: Eligibility criteria reviewed with patient. This Nurse has reviewed this patient's inclusion and exclusion criteria and confirmed patient is eligible for study participation. Eligibility confirmed by treating investigator, who also agrees that patient should proceed with enrollment. Patient will continue with enrollment.  Data Collection: Patient was interviewed to collect the following information.  Medical History:  High Blood Pressure  No Coronary Artery Disease No Lupus    No Rheumatoid Arthritis  No Diabetes   No      Lynch Syndrome  No  Is the patient currently taking a magnesium supplement?   No  Does the patient have a personal history of cancer (greater than 5 years ago)?  No  Does the patient have  a family history of cancer in 1st or 2nd degree relatives? Yes If yes, Relationship(s) and Cancer type(s)? Grandmother with breast cancer.    Does the patient have history of alcohol consumption? Yes   If yes, current or former? Current Number of years? 27 Drinks per week? .25 (1 per month)  Does the patient have history of cigarette, cigar, pipe, or chewing tobacco use?  No   Blood Collection: Research blood obtained by [facility] venipuncture. Patient tolerated well without any adverse events.  Gift Card: $50 gift card given to patient for her participation in this study.    Patient was thanked for their participation in this study.   [name], BSN, RN, Charity fundraiser II [phone]

## 2023-12-10 NOTE — Research (Signed)
Trial:  [id number]- Effectiveness of Out-of Pocket Cost Communication and Financial Navigation (CostCOM) in Cancer Patients:   Patient Ariel Ayala was identified by Dr. Al Pimple as a potential candidate for the above listed study.  This Clinical Research Nurse met with Ariel Ayala, [id number], on 12/10/23 in a manner and location that ensures patient privacy to discuss participation in the above listed research study.  Patient is Accompanied by her cousin .  A copy of the informed consent document and separate HIPAA Authorization was provided to the patient.  Patient reads, speaks, and understands [place].   Patient was provided with the business card of this Nurse and encouraged to contact the research team with any questions.  Approximately 5 minutes were spent with the patient reviewing the informed consent documents.  Patient was provided the option of taking informed consent documents home to review and was encouraged to review at their convenience with their support network, including other care providers. Patient took the consent documents home to review. [name], BSN, RN, Charity fundraiser II [phone]

## 2023-12-10 NOTE — Progress Notes (Signed)
REFERRING PROVIDER: Rachel Moulds, Md [address] [place],  [place] [address]  PRIMARY PROVIDER:  Tollie Eth, NP  PRIMARY REASON FOR VISIT:  1. Malignant neoplasm of upper-outer quadrant of right breast in female, estrogen receptor positive (HCC)   2. Family history of breast cancer   3. Family history of colon cancer     HISTORY OF PRESENT ILLNESS:   Ariel Ayala, a 48 y.o. female, was seen for a [place] cancer genetics consultation during the breast multidisciplinary clinic at the request of Dr. Al Pimple due to a personal and family history of breast cancer.  Ariel Ayala presents to clinic today to discuss the possibility of a hereditary predisposition to cancer, to discuss genetic testing, and to further clarify her future cancer risks, as well as potential cancer risks for family members.   In January 2025, at the age of 70, Ariel Ayala was diagnosed with invasive ductal carcinoma of the right breast (ER+/PR+/HER2-). The treatment plan is pending.   CANCER HISTORY:  Oncology History  Malignant neoplasm of upper-outer quadrant of right breast in female, estrogen receptor positive (HCC)  11/26/2023 Mammogram   Recalled from screening. Small mass within the right breast 12 o'clock position felt to correspond with the mammographically identified distortion. Targeted ultrasound is performed, showing a small irregular hypoechoic mass right breast 12 o'clock position 2 cm from the nipple measuring 6 x 9 x 5 mm.No right axillary adenopathy.       11/29/2023 Pathology Results   IDC, grade 1, The tumor cells are negative for Her2 (0).  Estrogen Receptor:  90%, POSITIVE, MODERATE STAINING INTENSITY  Progesterone Receptor:  100%, POSITIVE, STRONG STAINING INTENSITY  Proliferation Marker Ki67:  5%    12/04/2023 Initial Diagnosis   Malignant neoplasm of upper-outer quadrant of right breast in female, estrogen receptor positive (HCC)   12/08/2023 Cancer Staging   Staging form: Breast, AJCC  8th Edition - Clinical stage from 12/08/2023: Stage IA (cT1b, cN0, cM0, G1, ER+, PR+, HER2-) - Signed by Ronny Bacon, PA-C on 12/08/2023 Stage prefix: Initial diagnosis Method of lymph node assessment: Clinical Histologic grading system: 3 grade system       Past Medical History:  Diagnosis Date   Asthma    Bloating 10/23/2020   CMV (cytomegalovirus infection) (HCC)    Early satiety 10/23/2020   Generalized abdominal pain 10/23/2020   Hair loss 03/12/2023   Indigestion 10/23/2020   Splenomegaly    on CT   Vitamin D deficiency 10/25/2020    Past Surgical History:  Procedure Laterality Date   BREAST BIOPSY Right 11/28/2023   MM RT BREAST BX W LOC DEV 1ST LESION IMAGE BX SPEC STEREO GUIDE 11/28/2023 [facility] MAMMOGR[facility]   DILATION AND CURETTAGE OF UTERUS     FOOT SURGERY Right    with hardware   trigger thumb Right      FAMILY HISTORY:  We obtained a detailed, 4-generation family history.  Significant diagnoses are listed below: Family History  Problem Relation Age of Onset   Breast cancer Maternal Grandmother        dx 67s-70s   Colon cancer Cousin        dx 79s   Skin cancer Cousin        SCC    Ariel Ayala's maternal cousin, who has a history of colon cancer in her 32s, had negative genetic testing previously.  No report was available for review today.  Ariel Ayala is unaware of other family history of hereditary  cancer genetic testing.   There is no reported Ashkenazi Jewish ancestry. There is no known consanguinity.  GENETIC COUNSELING ASSESSMENT: Ariel Ayala is a 48 y.o. female with a personal and family history of cancer which is somewhat suggestive of a hereditary cancer syndrome and predisposition to cancer given her age of diagnosis and the presence of breast cancer in her maternal grandmother. We, therefore, discussed and recommended the following at today's visit.   DISCUSSION: We discussed that 5 - 10% of cancer is hereditary, with most cases of  hereditary breast cancer associated with mutations in BRCA1/2.  There are other genes that can be associated with hereditary breast or other cancer syndromes.  Type of cancer risk and level of risk are gene-specific. We discussed that testing is beneficial for several reasons including knowing how to follow individuals after completing their treatment, identifying whether potential treatment options would be beneficial, and understanding if other family members could be at risk for cancer and allowing them to undergo genetic testing.   We reviewed the characteristics, features and inheritance patterns of hereditary cancer syndromes. We also discussed genetic testing, including the appropriate family members to test, the process of testing, insurance coverage and turn-around-time for results. We discussed the implications of a negative, positive and/or variant of uncertain significant result. In order to get genetic test results in a timely manner so that Ariel Ayala can use these genetic test results for surgical decisions, we recommended Ariel Ayala pursue genetic testing for the [facility] BRCAPlus Panel.  The BRCAPlus gene panel offered by [facility] and includes sequencing and rearrangement analysis for the following 13 genes: ATM, BARD1, BRCA1, BRCA2, CDH1, CHEK2, NF1, PALB2, PTEN, RAD51C, RAD51D, STK11 and TP53. Once complete, we recommend Ariel Ayala pursue reflex genetic testing to a more comprehensive gene panel.   The [facility] CancerNext+RNAinsight Panel includes sequencing, rearrangement analysis, and RNA analysis for the following 39 genes: APC, ATM, BAP1, BARD1, BMPR1A, BRCA1, BRCA2, BRIP1, CDH1, CDKN2A, CHEK2, FH, FLCN, MET, MLH1, MSH2, MSH6, MUTYH, NF1, NTHL1, PALB2, PMS2, PTEN, RAD51C, RAD51D, SMAD4, STK11, TP53, TSC1, TSC2, and VHL (sequencing and deletion/duplication); AXIN2, HOXB13, MBD4, MSH3, POLD1 and POLE (sequencing only); EPCAM and GREM1 (deletion/duplication only).   Based on Ms.  Ayala's personal history of breast cancer before age 75, she meets NCCN criteria for genetic testing.  She is the most informative relative for genetic testing. We discussed that if her out of pocket cost for testing is over $100, the laboratory should contact her to discuss self-pay prices, patient pay assistance programs, if applicable, and other billing options.   PLAN: After considering the risks, benefits, and limitations, Ariel Ayala provided informed consent to pursue genetic testing and the blood sample was sent to [facility] for analysis of the BRCAPlus and CancerNext+RNA Panel. Results should be available within approximately 1-2 weeks' time, at which point they will be disclosed by telephone to Ariel Ayala, as will any additional recommendations warranted by these results. Ariel Ayala will receive a summary of her genetic counseling visit and a copy of her results once available. This information will also be available in Epic.   Ariel Ayala questions were answered to her satisfaction today. Our contact information was provided should additional questions or concerns arise. Thank you for the referral and allowing [place] to share in the care of your patient.   Ariel Nissley M. Rennie Plowman, MS, [facility] Genetic Counselor [email or web address] (P) [phone]    35 minutes were spent on the date of the encounter in service to  the patient including preparation, face-to-face consultation, documentation and care coordination.  The patient was accompanied by her cousin.  Dr. Al Pimple was available to discuss this case as needed.   _______________________________________________________________________ For Office Staff:  Number of people involved in session: 2 Was an Intern/ student involved with case: no

## 2023-12-10 NOTE — Progress Notes (Signed)
[facility] CONSULT NOTE  Patient Care Team: Early, Sung Amabile, NP as PCP - General (Nurse Practitioner) Pershing Proud, RN as Oncology Nurse Navigator Donnelly Angelica, RN as Oncology Nurse Navigator  CHIEF COMPLAINTS/PURPOSE OF CONSULTATION:  Newly diagnosed breast cancer  HISTORY OF PRESENTING ILLNESS:  Ariel Ayala 48 y.o. female is here because of recent diagnosis of right breast cancer  I reviewed her records extensively and collaborated the history with the patient.  SUMMARY OF ONCOLOGIC HISTORY: Oncology History  Malignant neoplasm of upper-outer quadrant of right breast in female, estrogen receptor positive (HCC)  11/26/2023 Mammogram   Recalled from screening. Small mass within the right breast 12 o'clock position felt to correspond with the mammographically identified distortion. Targeted ultrasound is performed, showing a small irregular hypoechoic mass right breast 12 o'clock position 2 cm from the nipple measuring 6 x 9 x 5 mm.No right axillary adenopathy.       11/29/2023 Pathology Results   IDC, grade 1, The tumor cells are negative for Her2 (0).  Estrogen Receptor:  90%, POSITIVE, MODERATE STAINING INTENSITY  Progesterone Receptor:  100%, POSITIVE, STRONG STAINING INTENSITY  Proliferation Marker Ki67:  5%    12/04/2023 Initial Diagnosis   Malignant neoplasm of upper-outer quadrant of right breast in female, estrogen receptor positive (HCC)   12/08/2023 Cancer Staging   Staging form: Breast, AJCC 8th Edition - Clinical stage from 12/08/2023: Stage IA (cT1b, cN0, cM0, G1, ER+, PR+, HER2-) - Signed by Ronny Bacon, PA-C on 12/08/2023 Stage prefix: Initial diagnosis Method of lymph node assessment: Clinical Histologic grading system: 3 grade system    Discussed the use of AI scribe software for clinical note transcription with the patient, who gave verbal consent to proceed.  History of Present Illness        The patient, a 48 year old  female, presents with a recent diagnosis of breast cancer. The cancer was identified in the right breast at the 12 o'clock position, measuring approximately 9mm in its largest dimension. The patient is aware that the cancer is hormone-sensitive, being estrogen and progesterone positive, and HER2 negative. The patient has not experienced any symptoms related to the cancer and it was discovered during a routine mammogram.  The patient is currently perimenopausal and has been experiencing related symptoms, including mood swings and hot flashes. She was recently started on Zoloft and Low dose estrogen ( discontinued) by her gynecologist to manage these symptoms. The patient also reports a history of frozen shoulder.  The patient has a family history of cancer, with a maternal cousin having been diagnosed with rectal cancer at the age of 67. The patient had genetic testing done at that time, which did not reveal any genetic markers for cancer.   MEDICAL HISTORY:  Past Medical History:  Diagnosis Date   Asthma    Bloating 10/23/2020   CMV (cytomegalovirus infection) (HCC)    Early satiety 10/23/2020   Generalized abdominal pain 10/23/2020   Hair loss 03/12/2023   Indigestion 10/23/2020   Splenomegaly    on CT   Vitamin D deficiency 10/25/2020    SURGICAL HISTORY: Past Surgical History:  Procedure Laterality Date   BREAST BIOPSY Right 11/28/2023   MM RT BREAST BX W LOC DEV 1ST LESION IMAGE BX SPEC STEREO GUIDE 11/28/2023 [facility]AMMO[facility]   DILATION AND CURETTAGE OF UTERUS     FOOT SURGERY Right    with hardware   trigger thumb Right     SOCIAL HISTORY: Social History  Socioeconomic History   Marital status: Single    Spouse name: Not on file   Number of children: Not on file   Years of education: Not on file   Highest education level: Not on file  Occupational History   Not on file  Tobacco Use   Smoking status: Never   Smokeless tobacco: Never  Vaping Use   Vaping status:  Never Used  Substance and Sexual Activity   Alcohol use: Yes    Comment: 1-2/month   Drug use: No   Sexual activity: Not on file  Other Topics Concern   Not on file  Social History Narrative   Not on file   Social Drivers of Health   Financial Resource Strain: Not on file  Food Insecurity: Not on file  Transportation Needs: Not on file  Physical Activity: Not on file  Stress: Not on file  Social Connections: Not on file  Intimate Partner Violence: Not on file    FAMILY HISTORY: Family History  Problem Relation Age of Onset   Diabetes Mother    Thyroid disease Mother    Suicidality Father    Breast cancer Maternal Grandmother 74   Colon cancer Cousin 50    ALLERGIES:  is allergic to sulfa antibiotics and macrobid [nitrofurantoin].  MEDICATIONS:  Current Outpatient Medications  Medication Sig Dispense Refill   cetirizine (ZYRTEC) 10 MG tablet Take 10 mg by mouth daily as needed for allergies.     Cholecalciferol (VITAMIN D-3 PO) Take 1 capsule by mouth daily.     Cyanocobalamin (VITAMIN B-12 PO) Take 1 tablet by mouth daily.     ibuprofen (ADVIL) 200 MG tablet Take 400 mg by mouth 2 (two) times daily as needed for headache or moderate pain (pain score 4-6).     Melatonin 10 MG CAPS Take by mouth as needed.     Multiple Vitamin (MULTIVITAMIN) tablet Take 1 tablet by mouth daily.     naproxen sodium (ALEVE) 220 MG tablet Take 220 mg by mouth daily as needed (pain, headache).     rosuvastatin (CRESTOR) 5 MG tablet TAKE 1 TABLET (5 MG TOTAL) BY MOUTH DAILY. (Patient taking differently: Take 5 mg by mouth at bedtime.) 90 tablet 1   sertraline (ZOLOFT) 25 MG tablet Take 25 mg by mouth daily.     Acetaminophen (TYLENOL DISSOLVE PACKS) 500 MG PACK Take 1,000 mg by mouth 2 (two) times daily as needed (pain, fever, headache). (Patient not taking: Reported on 06/30/2023)     No current facility-administered medications for this visit.    REVIEW OF SYSTEMS:   Constitutional:  Denies fevers, chills or abnormal night sweats Eyes: Denies blurriness of vision, double vision or watery eyes Ears, nose, mouth, throat, and face: Denies mucositis or sore throat Respiratory: Denies cough, dyspnea or wheezes Cardiovascular: Denies palpitation, chest discomfort or lower extremity swelling Gastrointestinal:  Denies nausea, heartburn or change in bowel habits Skin: Denies abnormal skin rashes Lymphatics: Denies new lymphadenopathy or easy bruising Neurological:Denies numbness, tingling or new weaknesses Behavioral/Psych: Mood is stable, no new changes  Breast: Denies any palpable lumps or discharge All other systems were reviewed with the patient and are negative.  PHYSICAL EXAMINATION: ECOG PERFORMANCE STATUS: 0 - Asymptomatic  Vitals:   12/10/23 0904  BP: 130/82  Pulse: 86  Resp: 18  Temp: 99.1 F (37.3 C)  SpO2: 95%   Filed Weights   12/10/23 0904  Weight: 159 lb 9.6 oz (72.4 kg)    GENERAL:alert, no distress and  comfortable SKIN: skin color, texture, turgor are normal, no rashes or significant lesions EYES: normal, conjunctiva are pink and non-injected, sclera clear OROPHARYNX:no exudate, no erythema and lips, buccal mucosa, and tongue normal  NECK: supple, thyroid normal size, non-tender, without nodularity LYMPH:  no palpable lymphadenopathy in the cervical, axillary or inguinal LUNGS: clear to auscultation and percussion with normal breathing effort HEART: regular rate & rhythm and no murmurs and no lower extremity edema ABDOMEN:abdomen soft, non-tender and normal bowel sounds Musculoskeletal:no cyanosis of digits and no clubbing  PSYCH: alert & oriented x 3 with fluent speech NEURO: no focal motor/sensory deficits BREAST: Dense breast tissue bilaterally. Palpable mass in right breast at twelve o'clock position, approximately under cm. Likely post-biopsy hematoma present.  LABORATORY DATA:  I have reviewed the data as listed Lab Results  Component  Value Date   WBC 14.1 (H) 06/26/2023   HGB 13.1 06/26/2023   HCT 39.5 06/26/2023   MCV 82.5 06/26/2023   PLT 375 06/26/2023   Lab Results  Component Value Date   NA 137 06/26/2023   K 4.1 06/26/2023   CL 101 06/26/2023   CO2 23 06/26/2023    RADIOGRAPHIC STUDIES: I have personally reviewed the radiological reports and agreed with the findings in the report.  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 Discussed the nature of the cancer, its origin, and the implications of its hormone receptor status. -Plan for lumpectomy as the first step in treatment. -Post-surgery, perform Oncotype DX test to further characterize the cancer and guide treatment decisions. -If Oncotype score is low, proceed to radiation therapy. If score is high, consider chemotherapy. -Consider participation in the Ofset trial if Oncotype score falls in the gray zone. -After radiation or chemotherapy, initiate antiestrogen therapy with Tamoxifen 20mg  daily. -Plan to monitor for side effects of Tamoxifen, including blood clots and endometrial hyperplasia/endometrial malignancy.  Dense Breasts Noted on examination. -Plan for MRI to better visualize the breast tissue due to the density.  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 Continue PT as advised.  Family History of Rectal Cancer Noted in the patient's maternal cousin. Genetic testing recommended.  Follow-up Plan to meet again after surgery to discuss the results and next steps in treatment.    All questions were answered. The patient knows to call the clinic with any problems, questions or concerns.    Rachel Moulds, MD 12/10/23

## 2023-12-10 NOTE — Progress Notes (Signed)
[facility] Clinical Social Work  Initial Assessment   Ariel Ayala is a 48 y.o. year old female accompanied by cousin, Ariel Ayala . Clinical Social Work was referred by  [facility]  for assessment of psychosocial needs.   SDOH (Social Determinants of Health) assessments performed: Yes SDOH Interventions    Flowsheet Row Clinical Support from 12/10/2023 in [facility] - A Dept Of [place]. [facility]  SDOH Interventions   Food Insecurity Interventions Intervention Not Indicated  Housing Interventions Intervention Not Indicated  Transportation Interventions Intervention Not Indicated  Utilities Interventions Intervention Not Indicated       SDOH Screenings   Food Insecurity: No Food Insecurity (12/10/2023)  Housing: Low Risk  (12/10/2023)  Transportation Needs: No Transportation Needs (12/10/2023)  Utilities: Not At Risk (12/10/2023)  Depression (PHQ2-9): Low Risk  (03/06/2023)  Tobacco Use: Low Risk  (12/10/2023)     Distress Screen completed: No     No data to display            Family/Social Information:  Housing Arrangement: patient lives with 45 yo daughter Ariel Ayala  (11th grader) Family members/support persons in your life? Family and Friends Transportation concerns: no  Employment: Working full time as an Control and instrumentation engineer. Has FMLA and PTO available Income source: Employment Financial concerns:  Wants to better understand potential medical costs Type of concern: Medical bills Food access concerns: no Religious or spiritual practice: No Advanced directives:  Not currently but interested in completing Services Currently in place:  [employer], therapist  Coping/ Adjustment to diagnosis: Patient understands treatment plan and what happens next? yes, wants to prepare as much as possible in advance in order to not have to rely on her daughter/ put too much on her Concerns about diagnosis and/or treatment: How I will care for other members of my family Patient  reported stressors: Anxiety/ nervousness and Adjusting to my illness Hopes and/or priorities: have minimal disruption to daughter's life Current coping skills/ strengths: Ability for insight , Capable of independent living , Communication skills , Motivation for treatment/growth , and Supportive family/friends     SUMMARY: Current SDOH Barriers:  No major barriers noted today  Clinical Social Work Clinical Goal(s):  Patient will follow up with advance directives clinic* as directed by SW  Interventions: Discussed common feeling and emotions when being diagnosed with cancer, and the importance of support during treatment Informed patient of the support team roles and support services at [facility] Provided CSW contact information and encouraged patient to call with any questions or concerns Signed pt up for advanced directives clinic   Follow Up Plan: Patient will complete AD at clinic on 12/12/23. Pt will follow-up with her therapist and notify this CSW if she needs additional, cancer-focused counseling support Patient verbalizes understanding of plan: Yes    Lili Harts E Madalynn Pickelsimer, LCSW Clinical Social Worker [facility]

## 2023-12-10 NOTE — Research (Signed)
[employer] 2021-05 - Specimen Collection Study to Evaluate Biomarkers in Subjects with Cancer     This Nurse has reviewed this patient's inclusion and exclusion criteria as a second review and confirms Ariel Ayala is eligible for study participation.  Patient may continue with enrollment.   Janan Ridge RN, BSN, CCRP Clinical Research Nurse Lead 12/10/2023 1:11 PM

## 2023-12-10 NOTE — Therapy (Signed)
OUTPATIENT PHYSICAL THERAPY BREAST CANCER BASELINE EVALUATION - SHOULDER EVALUATION   Patient Name: Ariel Ayala MRN: [id number] DOB:1975-12-06, 48 y.o., female Today's Date: 12/10/2023  END OF SESSION:  PT End of Session - 12/10/23 1203     Visit Number 1    Number of Visits 2    Date for PT Re-Evaluation 02/04/24    PT Start Time 0940    PT Stop Time 1010    PT Time Calculation (min) 30 min    Activity Tolerance Patient tolerated treatment well    Behavior During Therapy [facility] for tasks assessed/performed             Past Medical History:  Diagnosis Date   Asthma    Bloating 10/23/2020   CMV (cytomegalovirus infection) (HCC)    Early satiety 10/23/2020   Generalized abdominal pain 10/23/2020   Hair loss 03/12/2023   Indigestion 10/23/2020   Splenomegaly    on CT   Vitamin D deficiency 10/25/2020   Past Surgical History:  Procedure Laterality Date   BREAST BIOPSY Right 11/28/2023   MM RT BREAST BX W LOC DEV 1ST LESION IMAGE BX SPEC STEREO GUIDE 11/28/2023 [facility]   DILATION AND CURETTAGE OF UTERUS     FOOT SURGERY Right    with hardware   trigger thumb Right    Patient Active Problem List   Diagnosis Date Noted   Malignant neoplasm of upper-outer quadrant of right breast in female, estrogen receptor positive (HCC) 12/04/2023   Iron deficiency 07/02/2023   Mixed hyperlipidemia 07/02/2023   Perimenopause 03/12/2023   Tenosynovitis of right hand 01/29/2023   Osteoarthritis of carpometacarpal (CMC) joint of thumb 01/29/2023   Adhesive capsulitis of right shoulder 08/01/2022   Body mass index (BMI) of 26.0-26.9 in adult 04/02/2022   Anxiety 04/01/2022   Primary insomnia 04/01/2022   Abnormal cervical Papanicolaou smear 11/06/2021   Genital herpes simplex 03/13/2021   Vitamin D deficiency 10/25/2020   Vegetarian diet 10/23/2020   Encounter for annual physical exam 10/23/2020   TMJ tenderness, bilateral 02/11/2020   Seasonal allergies 02/11/2020    Family history of thyroid disease in mother 05/09/2017   Elevated TSH 05/09/2017   CMV (cytomegalovirus infection) (HCC)    Splenomegaly 04/16/2017   Asthma 11/12/2016    PCP: Dr. Enid Skeens  REFERRING PROVIDER: Dr Abigail Miyamoto  REFERRING DIAG: Right breast cancer, right shoulder pain and tightness  THERAPY DIAG:  Malignant neoplasm of upper-outer quadrant of right breast in female, estrogen receptor positive (HCC)  Abnormal posture  Chronic right shoulder pain  Stiffness of right shoulder, not elsewhere classified  Rationale for Evaluation and Treatment: Rehabilitation  ONSET DATE: 11/28/2023 for breast cancer; early 2023 for right shoulder pathology  SUBJECTIVE:  SUBJECTIVE STATEMENT: Patient reports she is here today to be seen by her medical team for her newly diagnosed right breast cancer. She also reports she has been diagnosed with right shoulder adhesive capsulitis and treated by Dr. Ranell Patrick. She began having shoulder pain in 2023 after helping her Mom move and has had several cortisone injections. She tried PT briefly but did not tolerate it well due to pain.  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 It measures 9 mm and is located in the upper outer quadrant. It is ER/PR positive and HER2 negative with a Ki67 of 5%. Right shoulder adhesive capsulitis diagnosed in mid 2023.  PATIENT GOALS:   reduce lymphedema risk and learn post op HEP.   PAIN:  Are you having pain? Yes: NPRS scale: 3/10 Pain location: Right shoulder Pain description: sore Aggravating factors: Reaching;trying to move certain directions (abduction and IR) Relieving factors: Rest  PRECAUTIONS: Active CA   RED FLAGS: None   HAND  DOMINANCE: right  WEIGHT BEARING RESTRICTIONS: No  FALLS:  Has patient fallen in last 6 months? No  LIVING ENVIRONMENT: Patient lives with: her 62 y.o. 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  COGNITION: Overall cognitive status: Within functional limits for tasks assessed    POSTURE:  Forward head and rounded shoulders posture  PALPATION: Tender to palpation right anterior shoulder at bicipital groove  SPECIAL TESTS: Positive right shoulder Empty Can test  UPPER EXTREMITY AROM/PROM:  A/PROM RIGHT   eval   Shoulder extension 40  Shoulder flexion 130  Shoulder abduction 118 and painful  Shoulder internal rotation 30 and painful  Shoulder external rotation 80    (Blank rows = not tested)  A/PROM LEFT   eval  Shoulder extension 40  Shoulder flexion 152  Shoulder abduction 172  Shoulder internal rotation 58  Shoulder external rotation 90    (Blank rows = not tested)  CERVICAL AROM: All within normal limits  UPPER EXTREMITY STRENGTH: Right shoulder - did not tolerate testing due to pain with any resistance; Left shoulder WNL  LYMPHEDEMA ASSESSMENTS (in cm):   LANDMARK RIGHT   eval  10 cm proximal to olecranon process 28.5  Olecranon process 23.8  10 cm proximal to ulnar styloid process 22  Just proximal to ulnar styloid process 14.2  Across hand at thumb web space 16.2  At base of 2nd digit 6  (Blank rows = not tested)  LANDMARK LEFT   eval  10 cm proximal to olecranon process 28.2  Olecranon process 23.8  10 cm proximal to ulnar styloid process 21.2  Just proximal to ulnar styloid process 14.6  Across hand at thumb web space 16.8  At base of 2nd digit 5.9  (Blank rows =  not tested)  L-DEX LYMPHEDEMA SCREENING:  The patient was assessed using the L-Dex machine today to produce a lymphedema index baseline score. The patient will be reassessed on a regular basis (typically every 3 months) to obtain new L-Dex scores. If the score is > 6.5 points away from his/her baseline score indicating onset of subclinical lymphedema, it will be recommended to wear a compression garment for 4 weeks, 12 hours per day and then be reassessed. If the  score continues to be > 6.5 points from baseline at reassessment, we will initiate lymphedema treatment. Assessing in this manner has a 95% rate of preventing clinically significant lymphedema.   L-DEX FLOWSHEETS - 12/10/23 1200       L-DEX LYMPHEDEMA SCREENING   Measurement Type Unilateral    L-DEX MEASUREMENT EXTREMITY Upper Extremity    POSITION  Standing    DOMINANT SIDE Right    At Risk Side Right    BASELINE SCORE (UNILATERAL) -3             QUICK DASH SURVEY:  [name] - 12/10/23 0001     Open a tight or new jar Mild difficulty    Do heavy household chores (wash walls, wash floors) Mild difficulty    Carry a shopping bag or briefcase No difficulty    Wash your back No difficulty    Use a knife to cut food No difficulty    Recreational activities in which you take some force or impact through your arm, shoulder, or hand (golf, hammering, tennis) Moderate difficulty    During the past week, to what extent has your arm, shoulder or hand problem interfered with your normal social activities with family, friends, neighbors, or groups? Not at all    During the past week, to what extent has your arm, shoulder or hand problem limited your work or other regular daily activities Slightly    Arm, shoulder, or hand pain. Mild    Tingling (pins and needles) in your arm, shoulder, or hand Mild    Difficulty Sleeping No difficulty    DASH Score 15.91 %              PATIENT EDUCATION:  Education details: Lymphedema  risk reduction and post op shoulder/posture HEP Person educated: Patient Education method: Explanation, Demonstration, Handout Education comprehension: Patient verbalized understanding and returned demonstration  HOME EXERCISE PROGRAM: Patient was instructed today in a home exercise program today for post op shoulder range of motion. These included active assist shoulder flexion in sitting, scapular retraction, wall walking with shoulder abduction, and hands behind head external rotation.  She was encouraged to do these twice a day, holding 3 seconds and repeating 5 times when permitted by her physician.   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 It measures 9 mm and is located in the upper outer quadrant. It is ER/PR positive and HER2 negative with a Ki67 of 5%. Right shoulder adhesive capsulitis diagnosed in mid 2023. Her multidisciplinary medical team met prior to her assessments to determine a recommended treatment plan. She is planning to have a right lumpectomy and sentinel node biopsy followed by Oncotype testing, radiation, and anti-estrogen therapy. She will benefit from a post op PT reassessment to determine needs and from L-Dex screens every 3 months for 2 years to detect subclinical lymphedema. She will benefit from PT now to address right shoulder ROM limitations and pain for improved surgical outcomes and to obtain radiation positioning.  Pt will benefit from skilled therapeutic intervention to improve on the following deficits: Decreased knowledge of precautions, impaired UE functional use, pain, decreased ROM, postural dysfunction.   PT treatment/interventions: ADL/self-care home management, pt/family education, therapeutic exercise, dry needling, moist heat/ice prior to breast cancer surgery (if needed), manual therapy to include PROM, shoulder mobilizations, soft tissue work; postural  education.  REHAB POTENTIAL: Excellent  CLINICAL DECISION MAKING: Stable/uncomplicated  EVALUATION COMPLEXITY: Low   GOALS:  Goals reviewed with patient? YES  SHORT TERM GOALS:  Name Target Date Goal status  1 Pt will be able to demonstrate her initial HEP for shoulder ROM 01/07/2024   2 Pt will decrease her DASH score to </= 9 for improved overall shoulder function. 01/07/2024   3 Pt will report >/= 25% reduction in shoulder pain to tolerate radiation positioning. 01/07/2024   4 Pt will increase right shoulder abduction ROM to be >/= 130 for radiation positioning. 01/07/2024     LONG TERM GOALS:   Name Target Date Goal status  1 Pt will be able to verbalize understanding of pertinent lymphedema risk reduction practices relevant to her dx specifically related to skin care.  Baseline:  No knowledge 12/10/2023 Achieved at eval  2 Pt will be able to return demo and/or verbalize understanding of the post op HEP related to regaining shoulder ROM. Baseline:  No knowledge 12/10/2023 Achieved at eval  3 Pt will be able to verbalize understanding of the importance of attending the post op After Breast CA Class for further lymphedema risk reduction education and therapeutic exercise.  Baseline:  No knowledge 12/10/2023 Achieved at eval  4 Pt will demo she has regained full shoulder ROM and function post operatively compared to baselines.  Baseline: See objective measurements taken today. 02/04/2024     PLAN:  PT FREQUENCY/DURATION: EVAL and 1 follow up appointment for breast cancer. She will be seen 2x/week for up to 8 weeks (stopping for 2-3 weeks after surgery) for right shoulder issues.   PLAN FOR NEXT SESSION: For breast cancer - will reassess 3-4 weeks post op to determine needs. For shoulder - begin shoulder mobilizations, PROM, pulleys, heat/ice if needed, AAROM and AROM exercises   Patient will follow up at outpatient cancer rehab 3-4 weeks following surgery.  If the patient requires  physical therapy at that time, a specific plan will be dictated and sent to the referring physician for approval. The patient was educated today on appropriate basic range of motion exercises to begin post operatively and the importance of attending the After Breast Cancer class following surgery.  Patient was educated today on lymphedema risk reduction practices as it pertains to recommendations that will benefit the patient immediately following surgery.  She verbalized good understanding.    Physical Therapy Information for After Breast Cancer Surgery/Treatment:  Lymphedema is a swelling condition that you may be at risk for in your arm if you have lymph nodes removed from the armpit area.  After a sentinel node biopsy, the risk is approximately 5-9% and is higher after an axillary node dissection.  There is treatment available for this condition and it is not life-threatening.  Contact your physician or physical therapist with concerns. You may begin the 4 shoulder/posture exercises (see additional sheet) when permitted by your physician (typically a week after surgery).  If you have drains, you may need to wait until those are removed before beginning range of motion exercises.  A general recommendation is to not lift your arms above shoulder height until drains are removed.  These exercises should be done to your tolerance and gently.  This is not a "no pain/no gain" type of recovery so listen to your body and stretch into the range of motion that you can tolerate, stopping if you have pain.  If you are having immediate reconstruction, ask your plastic surgeon about doing exercises as he or she may want you to wait. We encourage you to attend the free one  time ABC (After Breast Cancer) class offered by [facility].  You will learn information related to lymphedema risk, prevention and treatment and additional exercises to regain mobility following surgery.  You can call  [phone] for more information.  This is offered the 1st and 3rd Monday of each month.  You only attend the class one time. While undergoing any medical procedure or treatment, try to avoid blood pressure being taken or needle sticks from occurring on the arm on the side of cancer.   This recommendation begins after surgery and continues for the rest of your life.  This may help reduce your risk of getting lymphedema (swelling in your arm). An excellent resource for those seeking information on lymphedema is the National Lymphedema Network's web site. It can be accessed at www.lymphnet.org If you notice swelling in your hand, arm or breast at any time following surgery (even if it is many years from now), please contact your doctor or physical therapist to discuss this.  Lymphedema can be treated at any time but it is easier for you if it is treated early on.  If you feel like your shoulder motion is not returning to normal in a reasonable amount of time, please contact your surgeon or physical therapist.  [facility] [phone]. [address], [address], [place] [address]  ABC CLASS After Breast Cancer Class  After Breast Cancer Class is a specially designed exercise class to assist you in a safe recover after having breast cancer surgery.  In this class you will learn how to get back to full function whether your drains were just removed or if you had surgery a month ago.  This one-time class is held the 1st and 3rd Monday of every month from 11:00 a.m. until 12:00 noon virtually.  This class is FREE and space is limited. For more information or to register for the next available class, call [phone].  Class Goals  Understand specific stretches to improve the flexibility of you chest and shoulder. Learn ways to safely strengthen your upper body and improve your posture. Understand the warning signs of infection and why you may be at risk for an arm  infection. Learn about Lymphedema and prevention.  ** You do not attend this class until after surgery.  Drains must be removed to participate  Patient was instructed today in a home exercise program today for post op shoulder range of motion. These included active assist shoulder flexion in sitting, scapular retraction, wall walking with shoulder abduction, and hands behind head external rotation.  She was encouraged to do these twice a day, holding 3 seconds and repeating 5 times when permitted by her physician.  Bethann Punches, [place] 12/10/23 12:18 PM

## 2023-12-12 ENCOUNTER — Inpatient Hospital Stay: Payer: Managed Care, Other (non HMO) | Admitting: Licensed Clinical Social Worker

## 2023-12-12 ENCOUNTER — Encounter: Payer: Self-pay | Admitting: Rehabilitative and Restorative Service Providers"

## 2023-12-12 ENCOUNTER — Ambulatory Visit: Payer: Managed Care, Other (non HMO) | Admitting: Rehabilitative and Restorative Service Providers"

## 2023-12-12 DIAGNOSIS — G8929 Other chronic pain: Secondary | ICD-10-CM

## 2023-12-12 DIAGNOSIS — Z170 Estrogen receptor positive status [ER+]: Secondary | ICD-10-CM

## 2023-12-12 DIAGNOSIS — M25611 Stiffness of right shoulder, not elsewhere classified: Secondary | ICD-10-CM

## 2023-12-12 DIAGNOSIS — C50411 Malignant neoplasm of upper-outer quadrant of right female breast: Secondary | ICD-10-CM | POA: Diagnosis not present

## 2023-12-12 DIAGNOSIS — R293 Abnormal posture: Secondary | ICD-10-CM

## 2023-12-12 NOTE — Therapy (Signed)
OUTPATIENT PHYSICAL THERAPY ORTHO TREATMENT NOTE   Patient Name: Ariel Ayala MRN: [id number] DOB:03-06-1976, 48 y.o., female Today's Date: 12/12/2023  END OF SESSION:  PT End of Session - 12/12/23 0849     Visit Number 2    Date for PT Re-Evaluation 02/04/24    Authorization - Visit Number 2    Authorization - Number of Visits 20    PT Start Time 0845    PT Stop Time 0925    PT Time Calculation (min) 40 min    Activity Tolerance Patient tolerated treatment well    Behavior During Therapy [facility] for tasks assessed/performed             Past Medical History:  Diagnosis Date   Asthma    Bloating 10/23/2020   CMV (cytomegalovirus infection) (HCC)    Early satiety 10/23/2020   Generalized abdominal pain 10/23/2020   Hair loss 03/12/2023   Indigestion 10/23/2020   Splenomegaly    on CT   Vitamin D deficiency 10/25/2020   Past Surgical History:  Procedure Laterality Date   BREAST BIOPSY Right 11/28/2023   MM RT BREAST BX W LOC DEV 1ST LESION IMAGE BX SPEC STEREO GUIDE 11/28/2023 [facility]   DILATION AND CURETTAGE OF UTERUS     FOOT SURGERY Right    with hardware   trigger thumb Right    Patient Active Problem List   Diagnosis Date Noted   Malignant neoplasm of upper-outer quadrant of right breast in female, estrogen receptor positive (HCC) 12/04/2023   Iron deficiency 07/02/2023   Mixed hyperlipidemia 07/02/2023   Perimenopause 03/12/2023   Tenosynovitis of right hand 01/29/2023   Osteoarthritis of carpometacarpal (CMC) joint of thumb 01/29/2023   Adhesive capsulitis of right shoulder 08/01/2022   Body mass index (BMI) of 26.0-26.9 in adult 04/02/2022   Anxiety 04/01/2022   Primary insomnia 04/01/2022   Abnormal cervical Papanicolaou smear 11/06/2021   Genital herpes simplex 03/13/2021   Vitamin D deficiency 10/25/2020   Vegetarian diet 10/23/2020   Encounter for annual physical exam 10/23/2020   TMJ tenderness, bilateral 02/11/2020   Seasonal  allergies 02/11/2020   Family history of thyroid disease in mother 05/09/2017   Elevated TSH 05/09/2017   CMV (cytomegalovirus infection) (HCC)    Splenomegaly 04/16/2017   Asthma 11/12/2016    PCP: Dr. Enid Skeens  REFERRING PROVIDER: Dr Abigail Miyamoto  REFERRING DIAG: Right breast cancer, right shoulder pain and tightness  THERAPY DIAG:  Malignant neoplasm of upper-outer quadrant of right breast in female, estrogen receptor positive (HCC)  Abnormal posture  Chronic right shoulder pain  Stiffness of right shoulder, not elsewhere classified  Rationale for Evaluation and Treatment: Rehabilitation  ONSET DATE: 11/28/2023 for breast cancer; early 2023 for right shoulder pathology  SUBJECTIVE:  SUBJECTIVE STATEMENT: Patient states that she has been having increased pain in her right upper arm region.  States that she has heard about dry needling and would be agreeable to trying it.  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 It measures 9 mm and is located in the upper outer quadrant. It is ER/PR positive and HER2 negative with a Ki67 of 5%. Right shoulder adhesive capsulitis diagnosed in mid 2023.  PATIENT GOALS:   reduce lymphedema risk and learn post op HEP.   PAIN:  Are you having pain? Yes: NPRS scale: 8/10 Pain location: Right shoulder Pain description: sore Aggravating factors: Reaching;trying to move certain directions (abduction and IR) Relieving factors: Rest  PRECAUTIONS: Active CA   RED FLAGS: None   HAND DOMINANCE: right  WEIGHT BEARING RESTRICTIONS: No  FALLS:  Has patient fallen in last 6 months? No  LIVING ENVIRONMENT: Patient lives with: her 55 y.o. 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  COGNITION: Overall cognitive status: Within functional limits for tasks assessed    POSTURE:  Forward head and rounded shoulders posture  PALPATION: Tender to palpation right anterior shoulder at bicipital groove  SPECIAL TESTS: Positive right shoulder Empty Can test  UPPER EXTREMITY AROM/PROM:  A/PROM RIGHT   eval   Shoulder extension 40  Shoulder flexion 130  Shoulder abduction 118 and painful  Shoulder internal rotation 30 and painful  Shoulder external rotation 80    (Blank rows = not tested)  A/PROM LEFT   eval  Shoulder extension 40  Shoulder flexion 152  Shoulder abduction 172  Shoulder internal rotation 58  Shoulder external rotation 90    (Blank rows = not tested)  CERVICAL AROM: All within normal limits  UPPER EXTREMITY STRENGTH: Right shoulder - did not tolerate testing due to pain with any resistance; Left shoulder WNL  LYMPHEDEMA ASSESSMENTS (in cm):   LANDMARK RIGHT   eval  10 cm proximal to olecranon process 28.5  Olecranon process 23.8  10 cm proximal to ulnar styloid process 22  Just proximal to ulnar styloid process 14.2  Across hand at thumb web space 16.2  At base of 2nd digit 6  (Blank rows = not tested)  LANDMARK LEFT   eval  10 cm proximal to olecranon process 28.2  Olecranon process 23.8  10 cm proximal to ulnar styloid process 21.2  Just proximal to ulnar styloid process 14.6  Across hand at thumb web space 16.8  At base of 2nd digit 5.9  (Blank rows = not tested)  L-DEX LYMPHEDEMA SCREENING:  The patient was assessed using the L-Dex machine today to produce a lymphedema index baseline score. The patient will be reassessed on a regular basis  (typically every 3 months) to obtain new L-Dex scores. If the score is > 6.5 points away from his/her baseline score indicating onset of subclinical lymphedema, it will be recommended to wear a compression garment for 4 weeks, 12 hours per day and then be reassessed. If the score continues to be > 6.5 points from baseline at reassessment, we will initiate lymphedema treatment. Assessing in this manner has a 95% rate of preventing clinically significant lymphedema.  QUICK DASH SURVEY: 12/10/2023:  [name]  15.91  PATIENT EDUCATION:  Education details: Lymphedema risk reduction and post op shoulder/posture HEP Person educated: Patient Education method: Explanation, Demonstration, Handout Education comprehension: Patient verbalized understanding and returned demonstration  TODAY'S TREATMENT:  DATE:  12/12/2023 Shoulder pulleys for flexion and abduction x2 min each Seated shoulder flexion with blue pball 2x10 Seated shoulder abduction with blue pball 2x10 Seated scapular retraction 2x10 Seated chest stretch with hands behind head 3x5 sec hold UE Ranger for flexion/extension on first, second, and 3rd step x10 each Trigger Point Dry Needling Initial Treatment: Pt instructed on Dry Needling rational, procedures, and possible side effects. Pt instructed to expect mild to moderate muscle soreness later in the day and/or into the next day.  Pt instructed in methods to reduce muscle soreness. Pt instructed to continue prescribed HEP. Patient was educated on signs and symptoms of infection and other risk factors and advised to seek medical attention should they occur.  Patient verbalized understanding of these instructions and education.  Patient Verbal Consent Given: Yes Education Handout Provided: Yes Muscles Treated: right deltoids Electrical Stimulation Performed: No Treatment Response/Outcome: Utilized skilled palpation to identify bony landmarks and trigger points.  Able to illicit twitch  response and muscle elongation.  Soft tissue mobilization with cocoa butter to right upper arm following to further promote tissue elongation.     HOME EXERCISE PROGRAM: Access Code: [email or web address] URL: [email or web address] Date: 12/12/2023 Prepared by: Reather Laurence  Exercises - Seated Scapular Retraction  - 2 x daily - 7 x weekly - 2 sets - 10 reps - Seated AAROM Shoulder Flexion  - 2 x daily - 7 x weekly - 2 sets - 10 reps - Standing Shoulder Abduction Finger Walk at Wall  - 2 x daily - 7 x weekly - 2 sets - 10 reps - Seated Chest Stretch with Hands Behind Head  - 2 x daily - 7 x weekly - 3 reps - 5 second hold - Seated Flexion Stretch with Swiss Ball  - 1 x daily - 7 x weekly - 2 sets - 10 reps - Standing Shoulder Abduction AAROM with Swiss Ball on Table  - 1 x daily - 7 x weekly - 3 sets - 10 reps   ASSESSMENT:  CLINICAL IMPRESSION: Ms Strum presents do skilled PT reporting that she is having significant pain in her right upper arm.  Patient states that she has had dry needling in the past from a chiropractor, so she would be willing to try it again if it would help decrease her pain.  Will limit areas of dry needling secondary to active cancer to stay away from the areas that may be effected by cancer.  Only dry needled the delts today, as that was her main area of pain today, but could try upper traps in a future session if patient has a good response.  Patient provided with HEP handout, to include exercises given at evaluation for post-op.  Patient states that she does not yet have a surgery date schedule.  Patient continues to require skilled PT to progress towards goal related activities.  REHAB POTENTIAL: Excellent  CLINICAL DECISION MAKING: Stable/uncomplicated  EVALUATION COMPLEXITY: Low   GOALS: Goals reviewed with patient? YES  SHORT TERM GOALS:  Name Target Date Goal status  1 Pt will be able to demonstrate her initial HEP for shoulder ROM 01/07/2024  Ongoing  2 Pt will decrease her DASH score to </= 9 for improved overall shoulder function. 01/07/2024 Initial  3 Pt will report >/= 25%  reduction in shoulder pain to tolerate radiation positioning. 01/07/2024 Ongoing  4 Pt will increase right shoulder abduction ROM to be >/= 130 for radiation positioning. 01/07/2024 Ongoing    LONG TERM GOALS:   Name Target Date Goal status  1 Pt will be able to verbalize understanding of pertinent lymphedema risk reduction practices relevant to her dx specifically related to skin care.  Baseline:  No knowledge 12/12/2023 Achieved at eval  2 Pt will be able to return demo and/or verbalize understanding of the post op HEP related to regaining shoulder ROM. Baseline:  No knowledge 12/12/2023 Achieved at eval  3 Pt will be able to verbalize understanding of the importance of attending the post op After Breast CA Class for further lymphedema risk reduction education and therapeutic exercise.  Baseline:  No knowledge 12/12/2023 Achieved at eval  4 Pt will demo she has regained full shoulder ROM and function post operatively compared to baselines.  Baseline: See objective measurements taken today. 02/04/2024     PLAN:  PT FREQUENCY/DURATION:  EVAL and 1 follow up appointment for breast cancer.  She will be seen 2x/week for up to 8 weeks (stopping for 2-3 weeks after surgery) for right shoulder issues.   PLAN FOR NEXT SESSION: For breast cancer - will reassess 3-4 weeks post op to determine needs. For shoulder - begin shoulder mobilizations, PROM, pulleys, heat/ice if needed, AAROM and AROM exercises      Reather Laurence, PT, DPT 12/12/23, 9:42 AM  [facility] [address], [address] [place], [place] [address] Phone # [phone] Fax [phone]

## 2023-12-12 NOTE — Patient Instructions (Addendum)
Trigger Point Dry Needling  What is Trigger Point Dry Needling (DN)? DN is a physical therapy technique used to treat muscle pain and dysfunction. Specifically, DN helps deactivate muscle trigger points (muscle knots).  A thin filiform needle is used to penetrate the skin and stimulate the underlying trigger point. The goal is for a local twitch response (LTR) to occur and for the trigger point to relax. No medication of any kind is injected during the procedure.   What Does Trigger Point Dry Needling Feel Like?  The procedure feels different for each individual patient. Some patients report that they do not actually feel the needle enter the skin and overall the process is not painful. Very mild bleeding may occur. However, many patients feel a deep cramping in the muscle in which the needle was inserted. This is the local twitch response.   How Will I feel after the treatment? Soreness is normal, and the onset of soreness may not occur for a few hours. Typically this soreness does not last longer than two days.  Bruising is uncommon, however; ice can be used to decrease any possible bruising.  In rare cases feeling tired or nauseous after the treatment is normal. In addition, your symptoms may get worse before they get better, this period will typically not last longer than 24 hours.   What Can I do After My Treatment? Increase your hydration by drinking more water for the next 24 hours.  You may place ice or heat on the areas treated that have become sore, however, do not use heat on inflamed or bruised areas. Heat often brings more relief post needling. You can continue your regular activities, but vigorous activity is not recommended initially after the treatment for 24 hours. DN is best combined with other physical therapy such as strengthening, stretching, and other therapies.   What are the complications? While your therapist has had extensive training in minimizing the risks of trigger  point dry needling, it is important to understand the risks of any procedure.  Risks include bleeding, pain, fatigue, hematoma, infection, vertigo, nausea or nerve involvement. Monitor for any changes to your skin or sensation. Contact your therapist or MD with concerns.  A rare but serious complication is a pneumothorax over or near your middle and upper chest and back If you have dry needling in this area, monitor for the following symptoms: Shortness of breath on exertion and/or Difficulty taking a deep breath and/or Chest Pain and/or A dry cough If any of the above symptoms develop, please go to the nearest emergency room or call 911. Tell them you had dry needling over your thorax and report any symptoms you are having. Please follow-up with your treating therapist after you complete the medical evaluation.   [facility]  [address] [place] [address].  [phone]      Patient will follow up at outpatient cancer rehab 3-4 weeks following surgery.  If the patient requires physical therapy at that time, a specific plan will be dictated and sent to the referring physician for approval. The patient was educated today on appropriate basic range of motion exercises to begin post operatively and the importance of attending the After Breast Cancer class following surgery.  Patient was educated today on lymphedema risk reduction practices as it pertains to recommendations that will benefit the patient immediately following surgery.  She verbalized good understanding.    Physical Therapy Information for After Breast Cancer Surgery/Treatment:  Lymphedema is a  swelling condition that you may be at risk for in your arm if you have lymph nodes removed from the armpit area.  After a sentinel node biopsy, the risk is approximately 5-9% and is higher after an axillary node dissection.  There is treatment available for this condition and it is not life-threatening.   Contact your physician or physical therapist with concerns. You may begin the 4 shoulder/posture exercises (see additional sheet) when permitted by your physician (typically a week after surgery).  If you have drains, you may need to wait until those are removed before beginning range of motion exercises.  A general recommendation is to not lift your arms above shoulder height until drains are removed.  These exercises should be done to your tolerance and gently.  This is not a "no pain/no gain" type of recovery so listen to your body and stretch into the range of motion that you can tolerate, stopping if you have pain.  If you are having immediate reconstruction, ask your plastic surgeon about doing exercises as he or she may want you to wait. We encourage you to attend the free one time ABC (After Breast Cancer) class offered by [facility].  You will learn information related to lymphedema risk, prevention and treatment and additional exercises to regain mobility following surgery.  You can call [phone] for more information.  This is offered the 1st and 3rd Monday of each month.  You only attend the class one time. While undergoing any medical procedure or treatment, try to avoid blood pressure being taken or needle sticks from occurring on the arm on the side of cancer.   This recommendation begins after surgery and continues for the rest of your life.  This may help reduce your risk of getting lymphedema (swelling in your arm). An excellent resource for those seeking information on lymphedema is the National Lymphedema Network's web site. It can be accessed at www.lymphnet.org If you notice swelling in your hand, arm or breast at any time following surgery (even if it is many years from now), please contact your doctor or physical therapist to discuss this.  Lymphedema can be treated at any time but it is easier for you if it is treated early on.  If you feel like your shoulder  motion is not returning to normal in a reasonable amount of time, please contact your surgeon or physical therapist.  [facility] [phone]. [address], [address], [place] [address]  ABC CLASS After Breast Cancer Class  After Breast Cancer Class is a specially designed exercise class to assist you in a safe recover after having breast cancer surgery.  In this class you will learn how to get back to full function whether your drains were just removed or if you had surgery a month ago.  This one-time class is held the 1st and 3rd Monday of every month from 11:00 a.m. until 12:00 noon virtually.  This class is FREE and space is limited. For more information or to register for the next available class, call [phone].  Class Goals  Understand specific stretches to improve the flexibility of you chest and shoulder. Learn ways to safely strengthen your upper body and improve your posture. Understand the warning signs of infection and why you may be at risk for an arm infection. Learn about Lymphedema and prevention.  ** You do not attend this class until after surgery.  Drains must be removed to participate  Patient was instructed today in a home exercise program today for post op shoulder range of motion. These included active assist shoulder flexion in sitting, scapular retraction, wall walking with shoulder abduction, and hands behind head external rotation.  She was encouraged to do these twice a day, holding 3 seconds and repeating 5 times when permitted by her physician.

## 2023-12-12 NOTE — Progress Notes (Signed)
[facility] Healthcare Advance Directives Clinical Social Work  Patient presented to Advance Directives Clinic  to review and complete healthcare advance directives.  Clinical Social Worker met with patient.  The patient designated Jaclynn Guarneri [phone]) as their primary healthcare agent and Zenon Mayo [phone]) as their secondary agent.  Patient also completed healthcare living will.    Documents were notarized and copies made for patient/family. Clinical Social Worker will send documents to medical records to be scanned into patient's chart. Clinical Social Worker encouraged patient/family to contact with any additional questions or concerns.   Rachel Moulds, LCSW Clinical Social Worker [facility]

## 2023-12-15 ENCOUNTER — Ambulatory Visit
Admission: RE | Admit: 2023-12-15 | Discharge: 2023-12-15 | Disposition: A | Discharge status: Home / Self Care | Payer: Managed Care, Other (non HMO) | Source: Ambulatory Visit | Attending: Surgery | Admitting: Surgery

## 2023-12-15 DIAGNOSIS — Z170 Estrogen receptor positive status [ER+]: Secondary | ICD-10-CM

## 2023-12-15 MED ORDER — IOPAMIDOL (ISOVUE-370) INJECTION 76%
100.0000 mL | Freq: Once | INTRAVENOUS | Status: AC | PRN
  Administered 2023-12-15: 100 mL via INTRAVENOUS

## 2023-12-16 ENCOUNTER — Telehealth: Payer: Self-pay | Admitting: *Deleted

## 2023-12-16 ENCOUNTER — Ambulatory Visit: Payer: Managed Care, Other (non HMO)

## 2023-12-16 DIAGNOSIS — R293 Abnormal posture: Secondary | ICD-10-CM

## 2023-12-16 DIAGNOSIS — M25611 Stiffness of right shoulder, not elsewhere classified: Secondary | ICD-10-CM

## 2023-12-16 DIAGNOSIS — G8929 Other chronic pain: Secondary | ICD-10-CM

## 2023-12-16 DIAGNOSIS — C50411 Malignant neoplasm of upper-outer quadrant of right female breast: Secondary | ICD-10-CM | POA: Diagnosis not present

## 2023-12-16 DIAGNOSIS — M6281 Muscle weakness (generalized): Secondary | ICD-10-CM

## 2023-12-16 DIAGNOSIS — R252 Cramp and spasm: Secondary | ICD-10-CM

## 2023-12-16 NOTE — Therapy (Signed)
OUTPATIENT PHYSICAL THERAPY ORTHO TREATMENT NOTE   Patient Name: Ariel Ayala MRN: [id number] DOB:07-31-1976, 48 y.o., female Today's Date: 12/16/2023  END OF SESSION:  PT End of Session - 12/16/23 1148     Visit Number 3    Date for PT Re-Evaluation 02/04/24    Authorization - Visit Number 3    Authorization - Number of Visits 20    PT Start Time 1148    PT Stop Time 1218    PT Time Calculation (min) 30 min    Activity Tolerance Patient tolerated treatment well    Behavior During Therapy [facility] for tasks assessed/performed             Past Medical History:  Diagnosis Date   Asthma    Bloating 10/23/2020   CMV (cytomegalovirus infection) (HCC)    Early satiety 10/23/2020   Generalized abdominal pain 10/23/2020   Hair loss 03/12/2023   Indigestion 10/23/2020   Splenomegaly    on CT   Vitamin D deficiency 10/25/2020   Past Surgical History:  Procedure Laterality Date   BREAST BIOPSY Right 11/28/2023   MM RT BREAST BX W LOC DEV 1ST LESION IMAGE BX SPEC STEREO GUIDE 11/28/2023 [facility]   DILATION AND CURETTAGE OF UTERUS     FOOT SURGERY Right    with hardware   trigger thumb Right    Patient Active Problem List   Diagnosis Date Noted   Malignant neoplasm of upper-outer quadrant of right breast in female, estrogen receptor positive (HCC) 12/04/2023   Iron deficiency 07/02/2023   Mixed hyperlipidemia 07/02/2023   Perimenopause 03/12/2023   Tenosynovitis of right hand 01/29/2023   Osteoarthritis of carpometacarpal (CMC) joint of thumb 01/29/2023   Adhesive capsulitis of right shoulder 08/01/2022   Body mass index (BMI) of 26.0-26.9 in adult 04/02/2022   Anxiety 04/01/2022   Primary insomnia 04/01/2022   Abnormal cervical Papanicolaou smear 11/06/2021   Genital herpes simplex 03/13/2021   Vitamin D deficiency 10/25/2020   Vegetarian diet 10/23/2020   Encounter for annual physical exam 10/23/2020   TMJ tenderness, bilateral 02/11/2020   Seasonal  allergies 02/11/2020   Family history of thyroid disease in mother 05/09/2017   Elevated TSH 05/09/2017   CMV (cytomegalovirus infection) (HCC)    Splenomegaly 04/16/2017   Asthma 11/12/2016    PCP: Dr. Enid Ayala  REFERRING PROVIDER: Dr Ariel Ayala  REFERRING DIAG: Right breast cancer, right shoulder pain and tightness  THERAPY DIAG:  Abnormal posture  Chronic right shoulder pain  Stiffness of right shoulder, not elsewhere classified  Muscle weakness (generalized)  Cramp and spasm  Rationale for Evaluation and Treatment: Rehabilitation  ONSET DATE: 11/28/2023 for breast cancer; early 2023 for right shoulder pathology  SUBJECTIVE:  SUBJECTIVE STATEMENT: Patient reports she had some relief with the dry needling and that she did ok with the exercises done last session.  Pain is down to 4/10.    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 It measures 9 mm and is located in the upper outer quadrant. It is ER/PR positive and HER2 negative with a Ki67 of 5%. Right shoulder adhesive capsulitis diagnosed in mid 2023.  PATIENT GOALS:   reduce lymphedema risk and learn post op HEP.   PAIN:  12/16/23 Are you having pain? Yes: NPRS scale: 4/10 Pain location: Right shoulder Pain description: sore Aggravating factors: Reaching;trying to move certain directions (abduction and IR) Relieving factors: Rest  PRECAUTIONS: Active CA   RED FLAGS: None   HAND DOMINANCE: right  WEIGHT BEARING RESTRICTIONS: No  FALLS:  Has patient fallen in last 6 months? No  LIVING ENVIRONMENT: Patient lives with: her 50 y.o. 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  COGNITION: Overall cognitive status: Within functional limits for tasks assessed    POSTURE:  Forward head and rounded shoulders posture  PALPATION: Tender to palpation right anterior shoulder at bicipital groove  SPECIAL TESTS: Positive right shoulder Empty Can test  UPPER EXTREMITY AROM/PROM:  A/PROM RIGHT   eval   Shoulder extension 40  Shoulder flexion 130  Shoulder abduction 118 and painful  Shoulder internal rotation 30 and painful  Shoulder external rotation 80    (Blank rows = not tested)  A/PROM LEFT   eval  Shoulder extension 40  Shoulder flexion 152  Shoulder abduction 172  Shoulder internal rotation 58  Shoulder external rotation 90    (Blank rows = not tested)  CERVICAL AROM: All within normal limits  UPPER EXTREMITY STRENGTH: Right shoulder - did not tolerate testing due to pain with any resistance; Left shoulder WNL  LYMPHEDEMA ASSESSMENTS (in cm):   LANDMARK RIGHT   eval  10 cm proximal to olecranon process 28.5  Olecranon process 23.8  10 cm proximal to ulnar styloid process 22  Just proximal to ulnar styloid process 14.2  Across hand at thumb web space 16.2  At base of 2nd digit 6  (Blank rows = not tested)  LANDMARK LEFT   eval  10 cm proximal to olecranon process 28.2  Olecranon process 23.8  10 cm proximal to ulnar styloid process 21.2  Just proximal to ulnar styloid process 14.6  Across hand at thumb web space 16.8  At base of 2nd digit 5.9  (Blank rows = not tested)  L-DEX LYMPHEDEMA SCREENING:  The patient was assessed using the L-Dex machine today to produce a lymphedema index baseline score. The patient will be reassessed on a regular basis (typically every 3 months) to obtain new L-Dex scores. If the score is > 6.5  points away from his/her baseline score indicating onset of subclinical lymphedema, it will be recommended to wear a compression garment for 4 weeks, 12 hours per day and then be reassessed. If the score continues to be > 6.5 points from baseline at reassessment, we will initiate lymphedema treatment. Assessing in this manner has a 95% rate of preventing clinically significant lymphedema.  QUICK DASH SURVEY: 12/10/2023:  Ariel Ayala 15.91  PATIENT EDUCATION:  Education details: Lymphedema risk reduction and post op shoulder/posture HEP Person educated: Patient Education method: Explanation, Demonstration, Handout Education comprehension: Patient verbalized understanding and returned demonstration  TODAY'S TREATMENT: DATE:  12/16/2023 Shoulder pulleys for flexion and abduction x2 min each Seated shoulder flexion with blue pball 2x10 Seated shoulder abduction with blue pball 2x10 Seated scapular retraction 2x10 Seated chest stretch with hands behind head 3x5 sec hold UE Ranger for flexion/extension on first, second, and 3rd step x10 each Trigger Point Dry Needling Initial Treatment: Pt instructed on Dry Needling rational, procedures, and possible side effects. Pt instructed to expect mild to moderate muscle soreness later in the day and/or into the next day.  Pt instructed in methods to reduce muscle soreness. Pt instructed to continue prescribed HEP. Patient was educated on signs and symptoms of infection and other risk factors and advised to seek medical attention should they occur.  Patient verbalized understanding of these instructions and education.  Patient Verbal Consent Given: Yes Education Handout Provided: Yes Muscles Treated: right deltoids and right upper trap Electrical Stimulation Performed: No Treatment Response/Outcome: Utilized skilled palpation to identify bony landmarks and trigger points.  Able to illicit twitch response and muscle elongation.  Patient reported relief of  muscle tension post session   DATE:  12/12/2023 Shoulder pulleys for flexion and abduction x2 min each Seated shoulder flexion with blue pball 2x10 Seated shoulder abduction with blue pball 2x10 Seated scapular retraction 2x10 Seated chest stretch with hands behind head 3x5 sec hold UE Ranger for flexion/extension on first, second, and 3rd step x10 each Trigger Point Dry Needling Initial Treatment: Pt instructed on Dry Needling rational, procedures, and possible side effects. Pt instructed to expect mild to moderate muscle soreness later in the day and/or into the next day.  Pt instructed in methods to reduce muscle soreness. Pt instructed to continue prescribed HEP. Patient was educated on signs and symptoms of infection and other risk factors and advised to seek medical attention should they occur.  Patient verbalized understanding of these instructions and education.  Patient Verbal Consent Given: Yes Education Handout Provided: Yes Muscles Treated: right deltoids Electrical Stimulation Performed: No Treatment Response/Outcome: Utilized skilled palpation to identify bony landmarks and trigger points.  Able to illicit twitch response and muscle elongation.  Soft tissue mobilization with cocoa butter to right upper arm following to further promote tissue elongation.     HOME EXERCISE PROGRAM: Access Code: [email or web address] URL: [email or web address] Date: 12/12/2023 Prepared by: Reather Laurence  Exercises - Seated Scapular Retraction  - 2 x daily - 7 x weekly - 2 sets - 10 reps - Seated AAROM Shoulder Flexion  - 2 x daily - 7 x weekly - 2 sets - 10 reps - Standing Shoulder Abduction Finger Walk at Wall  - 2 x daily - 7 x weekly - 2 sets - 10 reps - Seated Chest Stretch with Hands Behind Head  - 2 x daily - 7 x weekly - 3 reps - 5 second hold - Seated Flexion Stretch with Swiss Ball  - 1 x daily - 7 x weekly - 2 sets - 10 reps - Standing Shoulder Abduction AAROM with Swiss Ball  on Table  - 1 x daily - 7 x weekly - 3 sets - 10 reps   ASSESSMENT:  CLINICAL IMPRESSION: Kariss responded well to initial gentle ROM and DN session.  She reported her pain at 4/10 today vs 8/10 on initial visit.  She was able to tolerate all activities  today with minimal pain.  She had multiple very prominent twitch responses in right upper trap and in deltoid today.  She reported relief of tension in the right upper trap and shoulder post treatment.   Patient states that she still doesn't have a surgery date schedule.  Patient continues to require skilled PT to progress towards goal related activities.  REHAB POTENTIAL: Excellent  CLINICAL DECISION MAKING: Stable/uncomplicated  EVALUATION COMPLEXITY: Low   GOALS: Goals reviewed with patient? YES  SHORT TERM GOALS:  Name Target Date Goal status  1 Pt will be able to demonstrate her initial HEP for shoulder ROM 01/07/2024 Ongoing  2 Pt will decrease her DASH score to </= 9 for improved overall shoulder function. 01/07/2024 Initial  3 Pt will report >/= 25% reduction in shoulder pain to tolerate radiation positioning. 01/07/2024 Ongoing  4 Pt will increase right shoulder abduction ROM to be >/= 130 for radiation positioning. 01/07/2024 Ongoing    LONG TERM GOALS:   Name Target Date Goal status  1 Pt will be able to verbalize understanding of pertinent lymphedema risk reduction practices relevant to her dx specifically related to skin care.  Baseline:  No knowledge 12/12/2023 Achieved at eval  2 Pt will be able to return demo and/or verbalize understanding of the post op HEP related to regaining shoulder ROM. Baseline:  No knowledge 12/12/2023 Achieved at eval  3 Pt will be able to verbalize understanding of the importance of attending the post op After Breast CA Class for further lymphedema risk reduction education and therapeutic exercise.  Baseline:  No knowledge 12/12/2023 Achieved at eval  4 Pt will demo she has regained full shoulder  ROM and function post operatively compared to baselines.  Baseline: See objective measurements taken today. 02/04/2024     PLAN:  PT FREQUENCY/DURATION:  EVAL and 1 follow up appointment for breast cancer.  She will be seen 2x/week for up to 8 weeks (stopping for 2-3 weeks after surgery) for right shoulder issues.   PLAN FOR NEXT SESSION: For breast cancer - will reassess 3-4 weeks post op to determine needs. For shoulder - progress shoulder mobilizations, PROM, pulleys, heat/ice if needed, AAROM and AROM exercises      Glendoris Nodarse B. Vyron Fronczak, PT 12/16/23 12:26 PM [facility] [address], [address] [place], [place] [address] Phone # [phone] Fax [phone]

## 2023-12-16 NOTE — Telephone Encounter (Signed)
Patient called to see when she should do her surgery based on her genetic results, told her I would have a team member give her a call back with that information, message sent to the team

## 2023-12-17 ENCOUNTER — Inpatient Hospital Stay: Payer: Managed Care, Other (non HMO) | Admitting: Licensed Clinical Social Worker

## 2023-12-17 NOTE — Progress Notes (Signed)
[facility] CSW Counseling Note  Patient was referred by self. Treatment type: Individual  Presenting Concerns: Patient and/or family reports the following symptoms/concerns: stress Duration of problem: 3 weeks; Severity of problem: moderate   Orientation:oriented to person, place, time/date, and situation.   Affect: Appropriate and Congruent Risk of harm to self or others: No plan to harm self or others  Patient and/or Family's Strengths/Protective Factors: Social connections, Social and Emotional competence, and Concrete supports in place (healthy food, safe environments, etc.)Ability for insight  Capable of independent living  Communication skills  Motivation for treatment/growth  Supportive family/friends      Goals Addressed: Patient will:  Reduce symptoms of: stress Increase healthy adjustment to current life circumstances   Progress towards Goals: Initial   Interventions: Interventions utilized:  Strength-based, Supportive, and Other: educational       Assessment: Patient currently experiencing adjustment to cancer diagnosis and preparation for surgery (potentially next week on 2/5). Pt is experiencing some emotional moments with crying or not sleeping as well from the stress but also hormonal changes. There are some moments where pt is not thinking about cancer or it feels not real. CSW normalized and discussed healthy balance of mental energy.  Pt is focusing on to-do lists and preparing as much as she can prior to surgery. Pt has good support from her cousin and a few friends and has a few other people she can rely on for practical needs such as rides for her daughter. Discussed some practical concerns today regarding preparing her house and tasks for pre- and post-surgery.      Plan: Follow up with CSW: 2 weeks Behavioral recommendations: continue your task list and call surgeon's office. Have a few activities available to engage in to occupy your mind post-surgery (ex:  puzzles, coloring, reading, tv) Referral(s): none at this time       Ariel Ayala E Ariel Wilhelmi, LCSW

## 2023-12-18 ENCOUNTER — Encounter: Payer: Self-pay | Admitting: Genetic Counselor

## 2023-12-18 ENCOUNTER — Encounter: Payer: Self-pay | Admitting: Rehabilitative and Restorative Service Providers"

## 2023-12-18 ENCOUNTER — Other Ambulatory Visit: Payer: Self-pay | Admitting: Surgery

## 2023-12-18 ENCOUNTER — Ambulatory Visit: Payer: Managed Care, Other (non HMO) | Admitting: Rehabilitative and Restorative Service Providers"

## 2023-12-18 ENCOUNTER — Encounter: Payer: Self-pay | Admitting: *Deleted

## 2023-12-18 DIAGNOSIS — Z170 Estrogen receptor positive status [ER+]: Secondary | ICD-10-CM

## 2023-12-18 DIAGNOSIS — R293 Abnormal posture: Secondary | ICD-10-CM

## 2023-12-18 DIAGNOSIS — M6281 Muscle weakness (generalized): Secondary | ICD-10-CM

## 2023-12-18 DIAGNOSIS — C50411 Malignant neoplasm of upper-outer quadrant of right female breast: Secondary | ICD-10-CM | POA: Diagnosis not present

## 2023-12-18 DIAGNOSIS — R252 Cramp and spasm: Secondary | ICD-10-CM

## 2023-12-18 DIAGNOSIS — M25611 Stiffness of right shoulder, not elsewhere classified: Secondary | ICD-10-CM

## 2023-12-18 DIAGNOSIS — G8929 Other chronic pain: Secondary | ICD-10-CM

## 2023-12-18 DIAGNOSIS — Z853 Personal history of malignant neoplasm of breast: Secondary | ICD-10-CM

## 2023-12-18 NOTE — Therapy (Signed)
OUTPATIENT PHYSICAL THERAPY ORTHO TREATMENT NOTE   Patient Name: Ariel Ayala MRN: [id number] DOB:December 22, 1975, 48 y.o., female Today's Date: 12/18/2023  END OF SESSION:  PT End of Session - 12/18/23 0828     Visit Number 4    Date for PT Re-Evaluation 02/04/24    Authorization - Visit Number 4    Authorization - Number of Visits 20    PT Start Time 0826    PT Stop Time 0905    PT Time Calculation (min) 39 min    Activity Tolerance Patient tolerated treatment well    Behavior During Therapy [facility] for tasks assessed/performed             Past Medical History:  Diagnosis Date   Asthma    Bloating 10/23/2020   CMV (cytomegalovirus infection) (HCC)    Early satiety 10/23/2020   Generalized abdominal pain 10/23/2020   Hair loss 03/12/2023   Indigestion 10/23/2020   Splenomegaly    on CT   Vitamin D deficiency 10/25/2020   Past Surgical History:  Procedure Laterality Date   BREAST BIOPSY Right 11/28/2023   MM RT BREAST BX W LOC DEV 1ST LESION IMAGE BX SPEC STEREO GUIDE 11/28/2023 [facility]   DILATION AND CURETTAGE OF UTERUS     FOOT SURGERY Right    with hardware   trigger thumb Right    Patient Active Problem List   Diagnosis Date Noted   Malignant neoplasm of upper-outer quadrant of right breast in female, estrogen receptor positive (HCC) 12/04/2023   Iron deficiency 07/02/2023   Mixed hyperlipidemia 07/02/2023   Perimenopause 03/12/2023   Tenosynovitis of right hand 01/29/2023   Osteoarthritis of carpometacarpal (CMC) joint of thumb 01/29/2023   Adhesive capsulitis of right shoulder 08/01/2022   Body mass index (BMI) of 26.0-26.9 in adult 04/02/2022   Anxiety 04/01/2022   Primary insomnia 04/01/2022   Abnormal cervical Papanicolaou smear 11/06/2021   Genital herpes simplex 03/13/2021   Vitamin D deficiency 10/25/2020   Vegetarian diet 10/23/2020   Encounter for annual physical exam 10/23/2020   TMJ tenderness, bilateral 02/11/2020   Seasonal  allergies 02/11/2020   Family history of thyroid disease in mother 05/09/2017   Elevated TSH 05/09/2017   CMV (cytomegalovirus infection) (HCC)    Splenomegaly 04/16/2017   Asthma 11/12/2016    PCP: Dr. Enid Skeens  REFERRING PROVIDER: Dr Abigail Miyamoto  REFERRING DIAG: Right breast cancer, right shoulder pain and tightness  THERAPY DIAG:  Abnormal posture  Chronic right shoulder pain  Stiffness of right shoulder, not elsewhere classified  Muscle weakness (generalized)  Cramp and spasm  Malignant neoplasm of upper-outer quadrant of right breast in female, estrogen receptor positive (HCC)  Rationale for Evaluation and Treatment: Rehabilitation  ONSET DATE: 11/28/2023 for breast cancer; early 2023 for right shoulder pathology  SUBJECTIVE:  SUBJECTIVE STATEMENT: Patient states that the dry needling does seem to be helping.  However, she is still feeling some soreness with using her arm more now than she was before.  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 It measures 9 mm and is located in the upper outer quadrant. It is ER/PR positive and HER2 negative with a Ki67 of 5%. Right shoulder adhesive capsulitis diagnosed in mid 2023.  PATIENT GOALS:   reduce lymphedema risk and learn post op HEP.   PAIN:  12/16/23 Are you having pain? Yes: NPRS scale: 4/10 Pain location: Right shoulder Pain description: sore Aggravating factors: Reaching;trying to move certain directions (abduction and IR) Relieving factors: Rest  PRECAUTIONS: Active CA   RED FLAGS: None   HAND DOMINANCE: right  WEIGHT BEARING RESTRICTIONS: No  FALLS:  Has patient fallen in last 6 months? No  LIVING ENVIRONMENT: Patient lives with: her 51  y.o. 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  COGNITION: Overall cognitive status: Within functional limits for tasks assessed    POSTURE:  Forward head and rounded shoulders posture  PALPATION: Tender to palpation right anterior shoulder at bicipital groove  SPECIAL TESTS: Positive right shoulder Empty Can test  UPPER EXTREMITY AROM/PROM:  A/ROM RIGHT   eval  Right (In standing) 12/18/23  Shoulder extension 40 42  Shoulder flexion 130 100 A/ROM 134 AA/ROM  Shoulder abduction 118 and painful 110  Shoulder internal rotation 30 and painful   Shoulder external rotation 80     (Blank rows = not tested)  A/PROM LEFT   eval  Shoulder extension 40  Shoulder flexion 152  Shoulder abduction 172  Shoulder internal rotation 58  Shoulder external rotation 90    (Blank rows = not tested)  CERVICAL AROM: All within normal limits  UPPER EXTREMITY STRENGTH: Right shoulder - did not tolerate testing due to pain with any resistance; Left shoulder WNL  LYMPHEDEMA ASSESSMENTS (in cm):   LANDMARK RIGHT   eval  10 cm proximal to olecranon process 28.5  Olecranon process 23.8  10 cm proximal to ulnar styloid process 22  Just proximal to ulnar styloid process 14.2  Across hand at thumb web space 16.2  At base of 2nd digit 6  (Blank rows = not tested)  LANDMARK LEFT   eval  10 cm proximal to olecranon process 28.2  Olecranon process 23.8  10 cm proximal to ulnar styloid process 21.2  Just proximal to ulnar styloid process 14.6  Across hand at thumb web space 16.8  At base of 2nd digit 5.9  (Blank rows = not tested)  L-DEX LYMPHEDEMA SCREENING:  The patient was assessed using the L-Dex machine  today to produce a lymphedema index baseline score. The patient will be reassessed on a regular basis (typically every 3 months) to obtain new L-Dex scores. If the score is > 6.5 points away from his/her baseline score indicating onset of subclinical lymphedema, it will be recommended to wear a compression garment for 4 weeks, 12 hours per day and then be reassessed. If the score continues to be > 6.5 points from baseline at reassessment, we will initiate lymphedema treatment. Assessing in this manner has a 95% rate of preventing  clinically significant lymphedema.  QUICK DASH SURVEY:  [name] - 12/18/23 0001     Open a tight or new jar Mild difficulty    Do heavy household chores (wash walls, wash floors) Severe difficulty    Carry a shopping bag or briefcase Mild difficulty    Wash your back Mild difficulty    Use a knife to cut food No difficulty    Recreational activities in which you take some force or impact through your arm, shoulder, or hand (golf, hammering, tennis) Unable    During the past week, to what extent has your arm, shoulder or hand problem interfered with your normal social activities with family, friends, neighbors, or groups? Slightly    During the past week, to what extent has your arm, shoulder or hand problem limited your work or other regular daily activities Slightly    Arm, shoulder, or hand pain. Mild    Tingling (pins and needles) in your arm, shoulder, or hand None    Difficulty Sleeping Moderate difficulty    DASH Score 34.09 %           12/10/2023:  Quick Dash 15.91 12/18/2023:  Quick Dash 34.09  PATIENT EDUCATION:  Education details: Lymphedema risk reduction and post op shoulder/posture HEP Person educated: Patient Education method: Programmer, multimedia, Demonstration, Handout Education comprehension: Patient verbalized understanding and returned demonstration  TODAY'S TREATMENT:  DATE:  12/18/2023 Shoulder pulleys for flexion and abduction x2 min  each Seated shoulder flexion with blue pball 2x10 Seated shoulder abduction with blue pball 2x10 Shoulder A/ROM in standing Quick Dash and education about upcoming surgical procedure Seated scapular retraction 2x10 Seated chest stretch with hands behind head 5x5 sec hold Standing UE Ranger for flexion (in cancer gym) 2x10 Standing scaption at finger ladder x10 Prone:  shoulder flexion, horizontal abduction, extension, rows.  RUE x10 Seated Active Assisted shoulder flexion with grasped hands x10 Seated right upper trap stretch 2x20 sec   DATE:  12/16/2023 Shoulder pulleys for flexion and abduction x2 min each Seated shoulder flexion with blue pball 2x10 Seated shoulder abduction with blue pball 2x10 Seated scapular retraction 2x10 Seated chest stretch with hands behind head 3x5 sec hold UE Ranger for flexion/extension on first, second, and 3rd step x10 each Trigger Point Dry Needling Initial Treatment: Pt instructed on Dry Needling rational, procedures, and possible side effects. Pt instructed to expect mild to moderate muscle soreness later in the day and/or into the next day.  Pt instructed in methods to reduce muscle soreness. Pt instructed to continue prescribed HEP. Patient was educated on signs and symptoms of infection and other risk factors and advised to seek medical attention should they occur.  Patient verbalized understanding of these instructions and education.  Patient Verbal Consent Given: Yes Education Handout Provided: Yes Muscles Treated: right deltoids and right upper trap Electrical Stimulation Performed: No Treatment Response/Outcome: Utilized skilled palpation to identify bony landmarks and trigger points.  Able to illicit twitch response and muscle elongation.  Patient reported relief of muscle tension post session   DATE:  12/12/2023 Shoulder pulleys for flexion and abduction x2 min each Seated shoulder flexion with blue pball 2x10 Seated shoulder abduction  with blue pball 2x10 Seated scapular retraction 2x10 Seated chest stretch with hands behind head 3x5 sec hold UE Ranger for flexion/extension on first, second, and 3rd step x10 each Trigger Point Dry Needling Initial Treatment: Pt instructed on Dry Needling rational, procedures, and possible side effects. Pt instructed to expect mild to moderate muscle  soreness later in the day and/or into the next day.  Pt instructed in methods to reduce muscle soreness. Pt instructed to continue prescribed HEP. Patient was educated on signs and symptoms of infection and other risk factors and advised to seek medical attention should they occur.  Patient verbalized understanding of these instructions and education.  Patient Verbal Consent Given: Yes Education Handout Provided: Yes Muscles Treated: right deltoids Electrical Stimulation Performed: No Treatment Response/Outcome: Utilized skilled palpation to identify bony landmarks and trigger points.  Able to illicit twitch response and muscle elongation.  Soft tissue mobilization with cocoa butter to right upper arm following to further promote tissue elongation.     HOME EXERCISE PROGRAM: Access Code: [email or web address] URL: [email or web address] Date: 12/12/2023 Prepared by: Reather Laurence  Exercises - Seated Scapular Retraction  - 2 x daily - 7 x weekly - 2 sets - 10 reps - Seated AAROM Shoulder Flexion  - 2 x daily - 7 x weekly - 2 sets - 10 reps - Standing Shoulder Abduction Finger Walk at Wall  - 2 x daily - 7 x weekly - 2 sets - 10 reps - Seated Chest Stretch with Hands Behind Head  - 2 x daily - 7 x weekly - 3 reps - 5 second hold - Seated Flexion Stretch with Swiss Ball  - 1 x daily - 7 x weekly - 2 sets - 10 reps - Standing Shoulder Abduction AAROM with Swiss Ball on Table  - 1 x daily - 7 x weekly - 3 sets - 10 reps   ASSESSMENT:  CLINICAL IMPRESSION: Ms Erpelding presents to skilled presents to skilled PT reporting that she is not  having the same sharp pain that she was having before, but she thinks it may be because she is using her arm more.  Patient will have her lumpectomy procedure next week on 2/5 and she is going to try to make her PT appointment on 12/23/23, but will call to cancel if she does not feel that she can make the appointment. Performed updated measurements for ROM and Dash today in case patient cannot make her appointment on 12/23/23.  Patient continues to require skilled PT to progress towards goal related activities.  REHAB POTENTIAL: Excellent  CLINICAL DECISION MAKING: Stable/uncomplicated  EVALUATION COMPLEXITY: Low   GOALS: Goals reviewed with patient? YES  SHORT TERM GOALS:  Name Target Date Goal status  1 Pt will be able to demonstrate her initial HEP for shoulder ROM 01/07/2024 Ongoing  2 Pt will decrease her DASH score to </= 9 for improved overall shoulder function. 01/07/2024 Ongoing  3 Pt will report >/= 25% reduction in shoulder pain to tolerate radiation positioning. 01/07/2024 Ongoing  4 Pt will increase right shoulder abduction ROM to be >/= 130 for radiation positioning. 01/07/2024 Ongoing    LONG TERM GOALS:   Name Target Date Goal status  1 Pt will be able to verbalize understanding of pertinent lymphedema risk reduction practices relevant to her dx specifically related to skin care.  Baseline:  No knowledge 12/12/2023 Achieved at eval  2 Pt will be able to return demo and/or verbalize understanding of the post op HEP related to regaining shoulder ROM. Baseline:  No knowledge 12/12/2023 Achieved at eval  3 Pt will be able to verbalize understanding of the importance of attending the post op After Breast CA Class for further lymphedema risk reduction education and therapeutic exercise.  Baseline:  No knowledge 12/12/2023 Achieved at eval  4 Pt will demo she  has regained full shoulder ROM and function post operatively compared to baselines.  Baseline: See objective measurements taken  today. 02/04/2024     PLAN:  PT FREQUENCY/DURATION:  EVAL and 1 follow up appointment for breast cancer.  She will be seen 2x/week for up to 8 weeks (stopping for 2-3 weeks after surgery) for right shoulder issues.   PLAN FOR NEXT SESSION: For breast cancer - will reassess 3-4 weeks post op to determine needs. For shoulder - progress shoulder mobilizations, PROM, pulleys, heat/ice if needed, AAROM and AROM exercises      Reather Laurence, PT, DPT 12/18/23, 9:24 AM  [facility] [address] [place], [place] [address] Phone # [phone] Fax [phone]

## 2023-12-19 DIAGNOSIS — C50911 Malignant neoplasm of unspecified site of right female breast: Secondary | ICD-10-CM | POA: Insufficient documentation

## 2023-12-19 HISTORY — DX: Malignant neoplasm of unspecified site of right female breast: C50.911

## 2023-12-22 ENCOUNTER — Encounter: Payer: Self-pay | Admitting: *Deleted

## 2023-12-22 ENCOUNTER — Ambulatory Visit: Payer: Self-pay | Admitting: Genetic Counselor

## 2023-12-22 DIAGNOSIS — Z1379 Encounter for other screening for genetic and chromosomal anomalies: Secondary | ICD-10-CM | POA: Insufficient documentation

## 2023-12-22 DIAGNOSIS — Z170 Estrogen receptor positive status [ER+]: Secondary | ICD-10-CM

## 2023-12-22 DIAGNOSIS — Z803 Family history of malignant neoplasm of breast: Secondary | ICD-10-CM

## 2023-12-22 DIAGNOSIS — Z800 Family history of malignant neoplasm of digestive organs: Secondary | ICD-10-CM

## 2023-12-22 NOTE — Pre-Procedure Instructions (Addendum)
 Surgical Instructions   Your procedure is scheduled on December 24, 2023. Report to [facility] Main Entrance A at 9:30 A.M., then check in with the Admitting office. Any questions or running late day of surgery: call [phone]  Questions prior to your surgery date: call [phone], Monday-Friday, 8am-4pm. If you experience any cold or flu symptoms such as cough, fever, chills, shortness of breath, etc. between now and your scheduled surgery, please notify us  at the above number.     Remember:  Do not eat after midnight the night before your surgery   You may drink clear liquids until 8:30AM the morning of your surgery.   Clear liquids allowed are: Water, Non-Citrus Juices (without pulp), Carbonated Beverages, Clear Tea (no milk, honey, etc.), Black Coffee Only (NO MILK, CREAM OR POWDERED CREAMER of any kind), and Gatorade.   Patient Instructions  The night before surgery:  No food after midnight. ONLY clear liquids after midnight  The day of surgery (if you do NOT have diabetes):  Drink ONE (1) Pre-Surgery Clear Ensure by 8:30AM the morning of surgery. Drink in one sitting. Do not sip.  This drink was given to you during your hospital  pre-op appointment visit.  Nothing else to drink after completing the  Pre-Surgery Clear Ensure.          If you have questions, please contact your surgeon's office.  Take these medicines the morning of surgery with A SIP OF WATER: sertraline (ZOLOFT)   May take these medicines IF NEEDED: cetirizine (ZYRTEC)    One week prior to surgery, STOP taking any Aspirin (unless otherwise instructed by your surgeon) Aleve, Naproxen, Ibuprofen, Motrin, Advil, Goody's, BC's, all herbal medications, fish oil, and non-prescription vitamins.                     Do NOT Smoke (Tobacco/Vaping) for 24 hours prior to your procedure.  If you use a CPAP at night, you may bring your mask/headgear for your overnight stay.   You will be asked to remove  any contacts, glasses, piercing's, hearing aid's, dentures/partials prior to surgery. Please bring cases for these items if needed.    Patients discharged the day of surgery will not be allowed to drive home, and someone needs to stay with them for 24 hours.  SURGICAL WAITING ROOM VISITATION Patients may have no more than 2 support people in the waiting area - these visitors may rotate.   Pre-op nurse will coordinate an appropriate time for 1 ADULT support person, who may not rotate, to accompany patient in pre-op.  Children under the age of 88 must have an adult with them who is not the patient and must remain in the main waiting area with an adult.  If the patient needs to stay at the hospital during part of their recovery, the visitor guidelines for inpatient rooms apply.  Please refer to the [facility] website for the visitor guidelines for any additional information.   If you received a COVID test during your pre-op visit  it is requested that you wear a mask when out in public, stay away from anyone that may not be feeling well and notify your surgeon if you develop symptoms. If you have been in contact with anyone that has tested positive in the last 10 days please notify you surgeon.      Pre-operative CHG Bathing Instructions   You can play a key role in reducing the risk of infection after surgery. Your skin  needs to be as free of germs as possible. You can reduce the number of germs on your skin by washing with CHG (chlorhexidine  gluconate) soap before surgery. CHG is an antiseptic soap that kills germs and continues to kill germs even after washing.   DO NOT use if you have an allergy to chlorhexidine /CHG or antibacterial soaps. If your skin becomes reddened or irritated, stop using the CHG and notify one of our RNs at [phone].              TAKE A SHOWER THE NIGHT BEFORE SURGERY AND THE DAY OF SURGERY    Please keep in mind the following:  DO NOT shave, including legs  and underarms, 48 hours prior to surgery.   You may shave your face before/day of surgery.  Place clean sheets on your bed the night before surgery Use a clean washcloth (not used since being washed) for each shower. DO NOT sleep with pet's night before surgery.  CHG Shower Instructions:  Wash your face and private area with normal soap. If you choose to wash your hair, wash first with your normal shampoo.  After you use shampoo/soap, rinse your hair and body thoroughly to remove shampoo/soap residue.  Turn the water OFF and apply half the bottle of CHG soap to a CLEAN washcloth.  Apply CHG soap ONLY FROM YOUR NECK DOWN TO YOUR TOES (washing for 3-5 minutes)  DO NOT use CHG soap on face, private areas, open wounds, or sores.  Pay special attention to the area where your surgery is being performed.  If you are having back surgery, having someone wash your back for you may be helpful. Wait 2 minutes after CHG soap is applied, then you may rinse off the CHG soap.  Pat dry with a clean towel  Put on clean pajamas    Additional instructions for the day of surgery: DO NOT APPLY any lotions, deodorants, cologne, or perfumes.   Do not wear jewelry or makeup Do not wear nail polish, gel polish, artificial nails, or any other type of covering on natural nails (fingers and toes) Do not bring valuables to the hospital. [facility] is not responsible for valuables/personal belongings. Put on clean/comfortable clothes.  Please brush your teeth.  Ask your nurse before applying any prescription medications to the skin.

## 2023-12-22 NOTE — Progress Notes (Signed)
HPI:   Ariel Ayala was previously seen in the [facility] due to a personal history of breast cancer and concerns regarding a hereditary predisposition to cancer.    Ariel Ayala recent genetic test results were disclosed to her by MyChart message. These results and recommendations are discussed in more detail below.  CANCER HISTORY:  Oncology History  Malignant neoplasm of upper-outer quadrant of right breast in female, estrogen receptor positive (HCC)  11/26/2023 Mammogram   Recalled from screening. Small mass within the right breast 12 o'clock position felt to correspond with the mammographically identified distortion. Targeted ultrasound is performed, showing a small irregular hypoechoic mass right breast 12 o'clock position 2 cm from the nipple measuring 6 x 9 x 5 mm.No right axillary adenopathy.       11/29/2023 Pathology Results   IDC, grade 1, The tumor cells are negative for Her2 (0).  Estrogen Receptor:  90%, POSITIVE, MODERATE STAINING INTENSITY  Progesterone Receptor:  100%, POSITIVE, STRONG STAINING INTENSITY  Proliferation Marker Ki67:  5%    12/04/2023 Initial Diagnosis   Malignant neoplasm of upper-outer quadrant of right breast in female, estrogen receptor positive (HCC)   12/08/2023 Cancer Staging   Staging form: Breast, AJCC 8th Edition - Clinical stage from 12/08/2023: Stage IA (cT1b, cN0, cM0, G1, ER+, PR+, HER2-) - Signed by Ronny Bacon, PA-C on 12/08/2023 Stage prefix: Initial diagnosis Method of lymph node assessment: Clinical Histologic grading system: 3 grade system   12/19/2023 Genetic Testing   Negative [facility] CancerNext+RNAinsight Panel.  Report date is 12/19/2023.   The [facility] CancerNext+RNAinsight Panel includes sequencing, rearrangement analysis, and RNA analysis for the following 39 genes: APC, ATM, BAP1, BARD1, BMPR1A, BRCA1, BRCA2, BRIP1, CDH1, CDKN2A, CHEK2, FH, FLCN, MET, MLH1, MSH2, MSH6, MUTYH, NF1, NTHL1, PALB2, PMS2,  PTEN, RAD51C, RAD51D, SMAD4, STK11, TP53, TSC1, TSC2, and VHL (sequencing and deletion/duplication); AXIN2, HOXB13, MBD4, MSH3, POLD1 and POLE (sequencing only); EPCAM and GREM1 (deletion/duplication only).     FAMILY HISTORY:  We obtained a detailed, 4-generation family history.  Significant diagnoses are listed below:      Family History  Problem Relation Age of Onset   Breast cancer Maternal Grandmother          dx 73s-70s   Colon cancer Cousin          dx 23s   Skin cancer Cousin          SCC     Ariel Ayala's maternal cousin, who has a history of colon cancer in her 21s, had negative genetic testing previously.  No report was available for review today.  Ariel Ayala is unaware of other family history of hereditary cancer genetic testing.    There is no reported Ashkenazi Jewish ancestry. There is no known consanguinity.  GENETIC TEST RESULTS:  The [facility] CancerNext+RNAinsight Panel found no pathogenic mutations.   The [facility] CancerNext+RNAinsight Panel includes sequencing, rearrangement analysis, and RNA analysis for the following 39 genes: APC, ATM, BAP1, BARD1, BMPR1A, BRCA1, BRCA2, BRIP1, CDH1, CDKN2A, CHEK2, FH, FLCN, MET, MLH1, MSH2, MSH6, MUTYH, NF1, NTHL1, PALB2, PMS2, PTEN, RAD51C, RAD51D, SMAD4, STK11, TP53, TSC1, TSC2, and VHL (sequencing and deletion/duplication); AXIN2, HOXB13, MBD4, MSH3, POLD1 and POLE (sequencing only); EPCAM and GREM1 (deletion/duplication only).  The test report has been scanned into EPIC and is located under the Molecular Pathology section of the Results Review tab.  A portion of the result report is included below for reference. Genetic testing reported out on December 19, 2023.  Even though a pathogenic variant was not identified, possible explanations for the cancer in the family may include: There may be no hereditary risk for cancer in the family. The cancers in Ariel Ayala and/or her family may be sporadic/familial or due to other  genetic and environmental factors.  Most cancer is not hereditary.  There may be a gene mutation in one of these genes that current testing methods cannot detect but that chance is small. There could be another gene that has not yet been discovered, or that we have not yet tested, that is responsible for the cancer diagnoses in the family.  It is also possible there is a hereditary cause for the cancer in the family that Ariel Ayala did not inherit.   Therefore, it is important to remain in touch with cancer genetics in the future so that we can continue to offer Ariel Ayala the most up to date genetic testing.    ADDITIONAL GENETIC TESTING:   Ariel Ayala genetic testing was fairly extensive.  If there are additional relevant genes identified to increase cancer risk that can be analyzed in the future, we would be happy to discuss and coordinate this testing at that time.     CANCER SCREENING RECOMMENDATIONS:  Ariel Ayala test result is considered negative (normal).  This means that we have not identified a hereditary cause for her personal history of breast cancer at this time.   An individual's cancer risk and medical management are not determined by genetic test results alone. Overall cancer risk assessment incorporates additional factors, including personal medical history, family history, and any available genetic information that may result in a personalized plan for cancer prevention and surveillance. Therefore, it is recommended she continue to follow the cancer management and screening guidelines provided by her oncology and primary healthcare provider.   RECOMMENDATIONS FOR FAMILY MEMBERS:   Since she did not inherit a identifiable mutation in a cancer predisposition gene included on this panel, her children could not have inherited a known mutation from her in one of these genes. Individuals in this family might be at some increased risk of developing cancer, over the general  population risk, due to the family history of cancer.  Individuals in the family should notify their providers of the family history of cancer. We recommend women in this family have a yearly mammogram beginning at age 75, or 48 years younger than the earliest onset of cancer, an annual clinical breast exam, and perform monthly breast self-exams.  Risk models that take into account family history and hormonal history may be helpful in determining appropriate breast cancer screening options for family members.  First degree relatives of those with colon cancer should receive colonoscopies beginning at age 64, or 10 years prior to the earliest diagnosis of colon cancer in the family, and receive colonoscopies at least every 5 years, or as recommended by their gastroenterologist.     FOLLOW-UP:  Cancer genetics is a rapidly advancing field and it is possible that new genetic tests will be appropriate for her and/or her family members in the future. We encourage Ms. Dennard to remain in contact with cancer genetics, so we can update her personal and family histories and let her know of advances in cancer genetics that may benefit this family.   Our contact number was provided.  They are welcome to call us at anytime with additional questions or concerns.   Hans Rusher M. Rennie Plowman, MS, [facility] [email or web address] (P) [phone]

## 2023-12-23 ENCOUNTER — Ambulatory Visit
Admission: RE | Admit: 2023-12-23 | Discharge: 2023-12-23 | Disposition: A | Discharge status: Home / Self Care | Payer: Managed Care, Other (non HMO) | Source: Ambulatory Visit | Attending: Surgery | Admitting: Surgery

## 2023-12-23 ENCOUNTER — Encounter (HOSPITAL_COMMUNITY): Payer: Self-pay

## 2023-12-23 ENCOUNTER — Other Ambulatory Visit: Payer: Self-pay

## 2023-12-23 ENCOUNTER — Encounter (HOSPITAL_COMMUNITY)
Admission: RE | Admit: 2023-12-23 | Discharge: 2023-12-23 | Disposition: A | Discharge status: Home / Self Care | Payer: Managed Care, Other (non HMO) | Source: Ambulatory Visit | Attending: Surgery | Admitting: Surgery

## 2023-12-23 ENCOUNTER — Encounter: Payer: Managed Care, Other (non HMO) | Admitting: Rehabilitative and Restorative Service Providers"

## 2023-12-23 VITALS — HR 88 | Temp 98.6°F | Resp 18 | Ht 61.0 in | Wt 159.2 lb

## 2023-12-23 DIAGNOSIS — Z853 Personal history of malignant neoplasm of breast: Secondary | ICD-10-CM

## 2023-12-23 DIAGNOSIS — Z01812 Encounter for preprocedural laboratory examination: Secondary | ICD-10-CM | POA: Diagnosis present

## 2023-12-23 DIAGNOSIS — C50911 Malignant neoplasm of unspecified site of right female breast: Secondary | ICD-10-CM | POA: Diagnosis not present

## 2023-12-23 DIAGNOSIS — Z01818 Encounter for other preprocedural examination: Secondary | ICD-10-CM

## 2023-12-23 HISTORY — PX: BREAST BIOPSY: SHX20

## 2023-12-23 LAB — POCT PREGNANCY, URINE: Preg Test, Ur: NEGATIVE

## 2023-12-23 NOTE — Progress Notes (Signed)
 PCP - Camie Doing, NP Cardiologist - denies  PPM/ICD - denies Device Orders - n/a Rep Notified - n/a  Chest x-ray - denies EKG - 06/27/23 Stress Test - denies ECHO - denies Cardiac Cath - denies  Sleep Study - denies CPAP - n/a  No DM  Last dose of GLP1 agonist-  n/a GLP1 instructions: n/a  Blood Thinner Instructions: n/a Aspirin Instructions: n/a  ERAS Protcol -  clears until 9:30 PRE-SURGERY Ensure or G2- Ensure as ordered  COVID TEST- n/a   Anesthesia review: yes - seed placement  Patient denies shortness of breath, fever, cough and chest pain at PAT appointment   All instructions explained to the patient, with a verbal understanding of the material. Patient agrees to go over the instructions while at home for a better understanding. Patient also instructed to self quarantine after being tested for COVID-19. The opportunity to ask questions was provided.

## 2023-12-23 NOTE — H&P (Signed)
 REFERRING PHYSICIAN: Loretha Linnette Lane Mamie* PROVIDER: VICENTA DASIE POLI, MD MRN: [id number] DOB: 1976-06-25 DATE OF ENCOUNTER: 12/10/2023 Subjective   Chief Complaint: Breast Cancer  History of Present Illness: Ariel Ayala is a 48 y.o. female who is seen as an office consultation for evaluation of Breast Cancer  This is a pleasant 48 year old female who was found on screen mammography to have a distortion of the breast. She underwent an ultrasound showing a 9 mm mass at the 12 o'clock position of the right breast. Ultrasound of the axilla was unremarkable. She had a biopsy of the mass showing a grade 1 invasive ductal carcinoma. It was 90% ER positive, 100% PR positive, HER2 negative, and had a Ki-67 of 5%. She has a family history of breast cancer in the maternal grandmother. She has had no previous problems regarding her breast. She is otherwise healthy without complaints. Denies nipple discharge.  Review of Systems: A complete review of systems was obtained from the patient. I have reviewed this information and discussed as appropriate with the patient. See HPI as well for other ROS.  ROS   Medical History: Past Medical History:  Diagnosis Date  Asthma, unspecified asthma severity, unspecified whether complicated, unspecified whether persistent (HHS-HCC)  History of cancer   There is no problem list on file for this patient.  Past Surgical History:  Procedure Laterality Date  DILATION AND CURETTAGE, DIAGNOSTIC / THERAPEUTIC  foot surgery  trigger thumb surgery    Allergies  Allergen Reactions  Sulfa (Sulfonamide Antibiotics) Anaphylaxis, Itching, Other (See Comments) and Swelling  Throat swells and gets itchy  Throat swelling  Throat swells and gets itchy Throat swells and gets itchy  Nitrofurantoin Monohyd/M-Cryst Abdominal Pain, Nausea and Vomiting   Current Outpatient Medications on File Prior to Visit  Medication Sig Dispense Refill  acetaminophen   (TYLENOL  EXTRA STRENGTH) 500 mg PwPk Take 1,000 mg by mouth  cetirizine (ZYRTEC) 10 mg capsule Take 1 capsule by mouth once daily  cholecalciferol (VITAMIN D3) 1000 unit capsule Take 1 capsule by mouth once daily  cyanocobalamin, vitamin B-12, (VITAMIN B-12 ORAL)  rosuvastatin  (CRESTOR ) 5 MG tablet Take 5 mg by mouth once daily  sertraline (ZOLOFT) 25 MG tablet Take 1 tablet by mouth at bedtime   No current facility-administered medications on file prior to visit.   Family History  Problem Relation Age of Onset  Colon cancer Other  Breast cancer Other    Social History   Tobacco Use  Smoking Status Never  Smokeless Tobacco Never    Social History   Socioeconomic History  Marital status: Single  Tobacco Use  Smoking status: Never  Smokeless tobacco: Never   Objective:  There were no vitals filed for this visit.  There is no height or weight on file to calculate BMI.  Physical Exam   She appears well on exam  There is some ecchymosis and a small hematoma at the 12 o'clock position of the right breast. There are no other breast masses and the nipple areolar complex is normal in appearance  There is no axillary adenopathy  Labs, Imaging and Diagnostic Testing: I have reviewed her mammograms, ultrasound, and pathology results  Assessment and Plan:   Diagnoses and all orders for this visit:  Invasive ductal carcinoma of breast, female, right (CMS/HHS-HCC)   This is a 48 year old female with invasive ductal carcinoma of the right breast. We have discussed her in our multidisciplinary breast cancer conference the morning of her visit. From a surgical standpoint I then  discussed breast conservation versus mastectomy. She is currently interested in breast conservation. I next discussed proceeding with a radioactive seed guided right breast lumpectomy and sentinel lymph node biopsy. I explained the procedure in detail as well as the need to biopsy lymph nodes in the axilla to  evaluate for the extent of  disease. We discussed the risk of surgery which includes but is not limited to bleeding, infection, injury to surrounding structures, the need for further surgery if margins or lymph nodes are positive, cardiopulmonary issues with surgery, postoperative recovery, etc. Given her young age, family history of breast cancer, and density of her breast, further imaging is recommended. We will order a contrast-enhanced mammogram of both her breasts to evaluate for any other breast cancer or abnormalities in the breast. Her genetics are negative.  She agrees to proceed with surgery

## 2023-12-23 NOTE — Anesthesia Preprocedure Evaluation (Signed)
Anesthesia Evaluation  Patient identified by MRN, date of birth, ID band Patient awake    Reviewed: Allergy & Precautions, H&P , NPO status , Patient's Chart, lab work & pertinent test results  Airway Mallampati: II  TM Distance: >3 FB Neck ROM: Full    Dental no notable dental hx. (+) Teeth Intact, Dental Advisory Given   Pulmonary asthma    Pulmonary exam normal breath sounds clear to auscultation       Cardiovascular negative cardio ROS  Rhythm:Regular Rate:Normal     Neuro/Psych   Anxiety     negative neurological ROS     GI/Hepatic negative GI ROS, Neg liver ROS,,,  Endo/Other  negative endocrine ROS    Renal/GU negative Renal ROS  negative genitourinary   Musculoskeletal  (+) Arthritis , Osteoarthritis,    Abdominal   Peds  Hematology negative hematology ROS (+)   Anesthesia Other Findings   Reproductive/Obstetrics negative OB ROS                             Anesthesia Physical Anesthesia Plan  ASA: 2  Anesthesia Plan: General   Post-op Pain Management: Regional block* and Tylenol PO (pre-op)*   Induction: Intravenous  PONV Risk Score and Plan: 4 or greater and Ondansetron, Dexamethasone and Midazolam  Airway Management Planned: LMA  Additional Equipment:   Intra-op Plan:   Post-operative Plan: Extubation in OR  Informed Consent: I have reviewed the patients History and Physical, chart, labs and discussed the procedure including the risks, benefits and alternatives for the proposed anesthesia with the patient or authorized representative who has indicated his/her understanding and acceptance.     Dental advisory given  Plan Discussed with: CRNA  Anesthesia Plan Comments: (PAT note written 12/23/2023 by Shonna Chock, PA-C.  )       Anesthesia Quick Evaluation

## 2023-12-23 NOTE — Progress Notes (Signed)
 Anesthesia Chart Review:  Case: [id number] Date/Time: 12/24/23 1115   Procedure: RIGHT BREAST LUMPECTOMY WITH RADIOACTIVE SEED AND SENTINEL LYMPH NODE BIOPSY (Right: Breast)   Anesthesia type: General   Pre-op diagnosis: RIGHT BREAST CANCER   [facility] / [facility]   Surgeons: Vernetta Berg, MD       DISCUSSION: Patient is a 48 year old female scheduled for the above procedure.  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   RSL scheduled for 12/23/2023. Urine pregnancy test negative on 12/23/2023.  Anesthesia team to evaluate on the day of surgery.   VS: Pulse 88   Temp 37 C   Resp 18   Ht 5' 1 (1.549 m)   Wt 72.2 kg   LMP 12/03/2023 (Exact Date)   SpO2 99%   BMI 30.08 kg/m  BP Readings from Last 3 Encounters:  12/10/23 130/82  06/30/23 118/74  06/26/23 117/74     PROVIDERS: [name], NP is PCP    LABS: Most recent lab results in [facility] include: Lab Results  Component Value Date   WBC 10.2 12/10/2023   HGB 11.9 (L) 12/10/2023   HCT 37.1 12/10/2023   PLT 441 (H) 12/10/2023   GLUCOSE 112 (H) 12/10/2023   CHOL 171 06/12/2023   TRIG 134 06/12/2023   HDL 71 06/12/2023   LDLCALC 77 06/12/2023   ALT 6 12/10/2023   AST 10 (L) 12/10/2023   NA 141 12/10/2023   K 3.2 (L) 12/10/2023   CL 105 12/10/2023   CREATININE 0.67 12/10/2023   BUN 12 12/10/2023   CO2 28 12/10/2023   TSH 2.800 03/06/2023   HGBA1C 5.2 03/06/2023     IMAGES: CTA Chest 06/26/23: IMPRESSION: 1. No pulmonary embolus. 2. Patchy ground-glass attenuation in the lungs bilaterally. This is nonspecific, but can be seen in the setting of atypical infection or edema. 3. No other acute or focal lesion to explain the patient's symptoms.    EKG: 06/26/23: Normal sinus rhythm Non-specific ST-t changes inferior and lateral leads No old tracing to compare Confirmed by Levander Houston [phone] on 06/26/2023 9:33:13 AM  CV: N/A  Past Medical History:  Diagnosis Date   Asthma    as a child   Bloating 10/23/2020   CMV (cytomegalovirus infection) (HCC)    Early satiety 10/23/2020   Generalized abdominal pain 10/23/2020   Hair loss 03/12/2023   Indigestion 10/23/2020   Invasive ductal carcinoma of breast, female, 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   Splenomegaly    on CT   Vitamin D  deficiency 10/25/2020    Past Surgical History:  Procedure Laterality Date   BREAST BIOPSY Right 11/28/2023   MM RT BREAST BX W LOC DEV 1ST LESION IMAGE BX SPEC STEREO GUIDE 11/28/2023 [facility]   DILATION AND CURETTAGE OF UTERUS     FOOT SURGERY Right    with hardware   trigger thumb Right     MEDICATIONS:  cetirizine (ZYRTEC) 10 MG tablet   Cholecalciferol (VITAMIN D -3 PO)   Cyanocobalamin (VITAMIN B-12 PO)   ibuprofen (ADVIL) 200 MG tablet   Melatonin 10 MG CAPS   Multiple Vitamin (MULTIVITAMIN) tablet   naproxen sodium (ALEVE) 220 MG tablet   rosuvastatin  (CRESTOR ) 5 MG tablet   sertraline (ZOLOFT) 25 MG tablet   No current facility-administered medications for this encounter.     Isaiah Ruder, PA-C Surgical Short Stay/Anesthesiology [facility] Phone [phone] [facility] Phone [phone] 12/23/2023 2:01 PM

## 2023-12-24 ENCOUNTER — Ambulatory Visit (HOSPITAL_COMMUNITY): Payer: Managed Care, Other (non HMO) | Admitting: Anesthesiology

## 2023-12-24 ENCOUNTER — Ambulatory Visit (HOSPITAL_COMMUNITY): Payer: Self-pay | Admitting: Vascular Surgery

## 2023-12-24 ENCOUNTER — Ambulatory Visit (HOSPITAL_COMMUNITY)
Admission: RE | Admit: 2023-12-24 | Discharge: 2023-12-24 | Disposition: A | Discharge status: Home / Self Care | Payer: Managed Care, Other (non HMO) | Attending: Surgery | Admitting: Surgery

## 2023-12-24 ENCOUNTER — Other Ambulatory Visit: Payer: Self-pay

## 2023-12-24 ENCOUNTER — Ambulatory Visit
Admission: RE | Admit: 2023-12-24 | Discharge: 2023-12-24 | Disposition: A | Discharge status: Home / Self Care | Payer: Managed Care, Other (non HMO) | Source: Ambulatory Visit | Attending: Surgery | Admitting: Surgery

## 2023-12-24 ENCOUNTER — Encounter (HOSPITAL_COMMUNITY): Payer: Self-pay | Admitting: Surgery

## 2023-12-24 ENCOUNTER — Encounter (HOSPITAL_COMMUNITY)
Admission: RE | Disposition: A | Discharge status: Home / Self Care | Payer: Self-pay | Source: Home / Self Care | Attending: Surgery

## 2023-12-24 DIAGNOSIS — F419 Anxiety disorder, unspecified: Secondary | ICD-10-CM | POA: Diagnosis not present

## 2023-12-24 DIAGNOSIS — Z803 Family history of malignant neoplasm of breast: Secondary | ICD-10-CM | POA: Diagnosis not present

## 2023-12-24 DIAGNOSIS — Z170 Estrogen receptor positive status [ER+]: Secondary | ICD-10-CM | POA: Diagnosis not present

## 2023-12-24 DIAGNOSIS — C50911 Malignant neoplasm of unspecified site of right female breast: Secondary | ICD-10-CM | POA: Insufficient documentation

## 2023-12-24 DIAGNOSIS — M1990 Unspecified osteoarthritis, unspecified site: Secondary | ICD-10-CM | POA: Diagnosis not present

## 2023-12-24 DIAGNOSIS — Z1721 Progesterone receptor positive status: Secondary | ICD-10-CM | POA: Diagnosis not present

## 2023-12-24 DIAGNOSIS — Z853 Personal history of malignant neoplasm of breast: Secondary | ICD-10-CM

## 2023-12-24 DIAGNOSIS — J45909 Unspecified asthma, uncomplicated: Secondary | ICD-10-CM | POA: Insufficient documentation

## 2023-12-24 HISTORY — PX: BREAST LUMPECTOMY WITH RADIOACTIVE SEED AND SENTINEL LYMPH NODE BIOPSY: SHX6550

## 2023-12-24 SURGERY — BREAST LUMPECTOMY WITH RADIOACTIVE SEED AND SENTINEL LYMPH NODE BIOPSY
Anesthesia: General | Site: Breast | Laterality: Right

## 2023-12-24 MED ORDER — ORAL CARE MOUTH RINSE: 15.0000 mL | Freq: Once | OROMUCOSAL | Status: AC

## 2023-12-24 MED ORDER — ENSURE PRE-SURGERY PO LIQD: 296.0000 mL | Freq: Once | ORAL | Status: DC

## 2023-12-24 MED ORDER — BUPIVACAINE-EPINEPHRINE 0.25% -1:200000 IJ SOLN
INTRAMUSCULAR | Status: DC | PRN
  Administered 2023-12-24: 16 mL

## 2023-12-24 MED ORDER — LIDOCAINE 2% (20 MG/ML) 5 ML SYRINGE
INTRAMUSCULAR | Status: DC | PRN
  Administered 2023-12-24: 60 mg via INTRAVENOUS

## 2023-12-24 MED ORDER — CHLORHEXIDINE GLUCONATE 0.12 % MT SOLN: 15.0000 mL | Freq: Once | OROMUCOSAL | Status: AC

## 2023-12-24 MED ORDER — ACETAMINOPHEN 10 MG/ML IV SOLN
INTRAVENOUS | Status: AC
  Filled 2023-12-24: qty 100

## 2023-12-24 MED ORDER — ACETAMINOPHEN 10 MG/ML IV SOLN
INTRAVENOUS | Status: DC | PRN
  Administered 2023-12-24: 1000 mg via INTRAVENOUS

## 2023-12-24 MED ORDER — TRAMADOL HCL 50 MG PO TABS: 50.0000 mg | ORAL_TABLET | Freq: Four times a day (QID) | ORAL | 0 refills | Status: DC | PRN

## 2023-12-24 MED ORDER — PROPOFOL 10 MG/ML IV BOLUS
INTRAVENOUS | Status: AC
  Filled 2023-12-24: qty 20

## 2023-12-24 MED ORDER — DEXAMETHASONE SODIUM PHOSPHATE 10 MG/ML IJ SOLN
INTRAMUSCULAR | Status: DC | PRN
  Administered 2023-12-24: 5 mg via INTRAVENOUS

## 2023-12-24 MED ORDER — CHLORHEXIDINE GLUCONATE CLOTH 2 % EX PADS: 6.0000 | MEDICATED_PAD | Freq: Once | CUTANEOUS | Status: DC

## 2023-12-24 MED ORDER — PHENYLEPHRINE 80 MCG/ML (10ML) SYRINGE FOR IV PUSH (FOR BLOOD PRESSURE SUPPORT)
PREFILLED_SYRINGE | INTRAVENOUS | Status: DC | PRN
  Administered 2023-12-24 (×2): 160 ug via INTRAVENOUS

## 2023-12-24 MED ORDER — FENTANYL CITRATE (PF) 100 MCG/2ML IJ SOLN
INTRAMUSCULAR | Status: AC
  Administered 2023-12-24: 100 ug via INTRAVENOUS
  Filled 2023-12-24: qty 2

## 2023-12-24 MED ORDER — PROPOFOL 10 MG/ML IV BOLUS
INTRAVENOUS | Status: DC | PRN
  Administered 2023-12-24: 150 mg via INTRAVENOUS

## 2023-12-24 MED ORDER — MIDAZOLAM HCL 2 MG/2ML IJ SOLN
INTRAMUSCULAR | Status: AC
  Filled 2023-12-24: qty 2

## 2023-12-24 MED ORDER — BUPIVACAINE-EPINEPHRINE (PF) 0.25% -1:200000 IJ SOLN
INTRAMUSCULAR | Status: AC
  Filled 2023-12-24: qty 30

## 2023-12-24 MED ORDER — AMISULPRIDE (ANTIEMETIC) 5 MG/2ML IV SOLN
INTRAVENOUS | Status: AC
  Filled 2023-12-24: qty 2

## 2023-12-24 MED ORDER — LACTATED RINGERS IV SOLN
INTRAVENOUS | Status: DC
Start: 2023-12-24 — End: 2023-12-24

## 2023-12-24 MED ORDER — MIDAZOLAM HCL 2 MG/2ML IJ SOLN: 2.0000 mg | Freq: Once | INTRAMUSCULAR | Status: AC

## 2023-12-24 MED ORDER — FENTANYL CITRATE (PF) 100 MCG/2ML IJ SOLN: 100.0000 ug | Freq: Once | INTRAMUSCULAR | Status: AC

## 2023-12-24 MED ORDER — ACETAMINOPHEN 500 MG PO TABS
1000.0000 mg | ORAL_TABLET | Freq: Once | ORAL | Status: DC
  Filled 2023-12-24: qty 2

## 2023-12-24 MED ORDER — HYDROMORPHONE HCL 1 MG/ML IJ SOLN: 0.2500 mg | INTRAMUSCULAR | Status: DC | PRN

## 2023-12-24 MED ORDER — 0.9 % SODIUM CHLORIDE (POUR BTL) OPTIME
TOPICAL | Status: DC | PRN
  Administered 2023-12-24: 1000 mL

## 2023-12-24 MED ORDER — FENTANYL CITRATE (PF) 250 MCG/5ML IJ SOLN
INTRAMUSCULAR | Status: AC
  Filled 2023-12-24: qty 5

## 2023-12-24 MED ORDER — BUPIVACAINE LIPOSOME 1.3 % IJ SUSP
INTRAMUSCULAR | Status: DC | PRN
  Administered 2023-12-24: 10 mL via PERINEURAL

## 2023-12-24 MED ORDER — MIDAZOLAM HCL 2 MG/2ML IJ SOLN
INTRAMUSCULAR | Status: AC
  Administered 2023-12-24: 2 mg via INTRAVENOUS
  Filled 2023-12-24: qty 2

## 2023-12-24 MED ORDER — LIDOCAINE 2% (20 MG/ML) 5 ML SYRINGE
INTRAMUSCULAR | Status: AC
  Filled 2023-12-24: qty 5

## 2023-12-24 MED ORDER — CEFAZOLIN SODIUM-DEXTROSE 2-4 GM/100ML-% IV SOLN
2.0000 g | INTRAVENOUS | Status: AC
  Administered 2023-12-24: 2 g via INTRAVENOUS

## 2023-12-24 MED ORDER — MAGTRACE LYMPHATIC TRACER
INTRAMUSCULAR | Status: DC | PRN
  Administered 2023-12-24: 1 mL via INTRAMUSCULAR

## 2023-12-24 MED ORDER — CEFAZOLIN SODIUM-DEXTROSE 2-4 GM/100ML-% IV SOLN
INTRAVENOUS | Status: AC
  Filled 2023-12-24: qty 100

## 2023-12-24 MED ORDER — ONDANSETRON HCL 4 MG/2ML IJ SOLN
INTRAMUSCULAR | Status: AC
  Filled 2023-12-24: qty 2

## 2023-12-24 MED ORDER — DEXAMETHASONE SODIUM PHOSPHATE 10 MG/ML IJ SOLN
INTRAMUSCULAR | Status: AC
  Filled 2023-12-24: qty 1

## 2023-12-24 MED ORDER — ONDANSETRON HCL 4 MG/2ML IJ SOLN
INTRAMUSCULAR | Status: DC | PRN
  Administered 2023-12-24: 4 mg via INTRAVENOUS

## 2023-12-24 MED ORDER — CHLORHEXIDINE GLUCONATE 0.12 % MT SOLN
OROMUCOSAL | Status: AC
  Administered 2023-12-24: 15 mL via OROMUCOSAL
  Filled 2023-12-24: qty 15

## 2023-12-24 MED ORDER — BUPIVACAINE-EPINEPHRINE (PF) 0.5% -1:200000 IJ SOLN
INTRAMUSCULAR | Status: DC | PRN
  Administered 2023-12-24: 20 mL via PERINEURAL

## 2023-12-24 SURGICAL SUPPLY — 36 items
APPLIER CLIP 9.375 MED OPEN (MISCELLANEOUS) ×1
BAG COUNTER SPONGE SURGICOUNT (BAG) ×1 IMPLANT
BINDER BREAST LRG (GAUZE/BANDAGES/DRESSINGS) IMPLANT
BINDER BREAST XLRG (GAUZE/BANDAGES/DRESSINGS) IMPLANT
CANISTER SUCT 3000ML PPV (MISCELLANEOUS) ×1 IMPLANT
CHLORAPREP W/TINT 26 (MISCELLANEOUS) ×1 IMPLANT
CLIP APPLIE 9.375 MED OPEN (MISCELLANEOUS) ×1 IMPLANT
CNTNR URN SCR LID CUP LEK RST (MISCELLANEOUS) IMPLANT
COVER PROBE W GEL 5X96 (DRAPES) ×2 IMPLANT
COVER SURGICAL LIGHT HANDLE (MISCELLANEOUS) ×1 IMPLANT
DERMABOND ADVANCED .7 DNX12 (GAUZE/BANDAGES/DRESSINGS) ×1 IMPLANT
DEVICE DUBIN SPECIMEN MAMMOGRA (MISCELLANEOUS) ×1 IMPLANT
DRAPE CHEST BREAST 15X10 FENES (DRAPES) ×1 IMPLANT
ELECT CAUTERY BLADE 6.4 (BLADE) ×1 IMPLANT
ELECT REM PT RETURN 9FT ADLT (ELECTROSURGICAL) ×1
ELECTRODE REM PT RTRN 9FT ADLT (ELECTROSURGICAL) ×1 IMPLANT
GLOVE SURG SIGNA 7.5 PF LTX (GLOVE) ×1 IMPLANT
GOWN STRL REUS W/ TWL LRG LVL3 (GOWN DISPOSABLE) ×1 IMPLANT
GOWN STRL REUS W/ TWL XL LVL3 (GOWN DISPOSABLE) ×1 IMPLANT
KIT BASIN OR (CUSTOM PROCEDURE TRAY) ×1 IMPLANT
KIT MARKER MARGIN INK (KITS) ×1 IMPLANT
NDL 18GX1X1/2 (RX/OR ONLY) (NEEDLE) IMPLANT
NDL FILTER BLUNT 18X1 1/2 (NEEDLE) IMPLANT
NDL HYPO 25GX1X1/2 BEV (NEEDLE) ×1 IMPLANT
NEEDLE 18GX1X1/2 (RX/OR ONLY) (NEEDLE)
NEEDLE FILTER BLUNT 18X1 1/2 (NEEDLE)
NEEDLE HYPO 25GX1X1/2 BEV (NEEDLE) ×1
NS IRRIG 1000ML POUR BTL (IV SOLUTION) ×1 IMPLANT
PACK GENERAL/GYN (CUSTOM PROCEDURE TRAY) ×1 IMPLANT
SUT MNCRL AB 4-0 PS2 18 (SUTURE) ×1 IMPLANT
SUT VIC AB 3-0 SH 18 (SUTURE) ×1 IMPLANT
SYR 3ML LL SCALE MARK (SYRINGE) IMPLANT
SYR CONTROL 10ML LL (SYRINGE) ×1 IMPLANT
TOWEL GREEN STERILE (TOWEL DISPOSABLE) ×1 IMPLANT
TOWEL GREEN STERILE FF (TOWEL DISPOSABLE) ×1 IMPLANT
TRACER MAGTRACE VIAL (MISCELLANEOUS) IMPLANT

## 2023-12-24 NOTE — Op Note (Signed)
 RIGHT BREAST LUMPECTOMY WITH RADIOACTIVE SEED AND SENTINEL LYMPH NODE BIOPSY  Procedure Note  Ariel Ayala 12/24/2023   Pre-op Diagnosis: RIGHT BREAST CANCER     Post-op Diagnosis: same  Procedure(s): RADIOACTIVE SEED GUIDED RIGHT BREAST LUMPECTOMY DEEP RIGHT AXILLARY SENTINEL LYMPH NODE BIOPSY INJECTION OF MAG TRACE FOR LYMPH NODE MAPPING  Surgeon(s): Vernetta Berg, MD  Anesthesia: General  Staff:  Circulator: Gerome Verla SAILOR, RN Scrub Person: Romaine Joesph SAILOR  Estimated Blood Loss: Minimal               Specimens: sent to path  Indications: This is a 48 year old female was found to have a small 9 mm mass at 12 position on the right breast on screening mammography.  A biopsy showed invasive ductal carcinoma.  The decision was made to proceed with a radioactive seed guided right breast lumpectomy and sentinel lymph node biopsy  Procedure: The patient was brought to the operating identifies correct patient.  She was placed upon the operating table and general anesthesia was induced.  Under sterile technique I then injected mag trace underneath the left nipple areolar complex and massaged the breast.  Her left breast and axilla were then prepped and draped in usual sterile fashion.  Using the neoprobe I then identified the signal from the radioactive seed at the 12 o'clock position of the right breast several centimeters from the nipple areolar complex.  I anesthetized the upper edge of the nipple areolar complex with Marcaine  and made incision with a scalpel.  I then dissected down to the breast tissue and then superiorly with the aid of the electrocautery.  With the neoprobe I then was able to identify the signal in the deeper breast tissue.  She had very dense breast tissue in this area.  I excised in all directions around the signal from radioactive seed with the electrocautery going down near the chest wall and then coming underneath the signal and complete the  lumpectomy with the cautery.  I then took the lumpectomy specimen and marked all margins with paint.  An x-ray was performed confirming the radioactive seed and previous biopsy clip were in the center of the specimen.  The specimen was then sent to pathology for evaluation.  With the mag trace probe I then identified an area of uptake in the right axilla.  I anesthetized skin with Marcaine  and made incision with a scalpel.  I then dissected down into the deep right axillary tissue with the cautery.  With the probe I was then able to identify uptake of mag trace and at least 2 lymph nodes which were visible.  I was able to excise these with the electrocautery in their entirety together and sent them to pathology for evaluation.  I palpated the axilla further and found no enlarged lymph nodes.  There was no further uptake of the mag trace in the axilla as well.  I next anesthetized the lumpectomy cavity further with Marcaine .  Hemostasis was achieved with the cautery.  I placed surgical clips around the periphery of the cavity.  I then closed the subcutaneous tissue with interrupted 3-0 Vicryl sutures and closed the skin with a running 4-0 Monocryl.  I then closed the axilla with interrupted 3-0 Vicryl sutures and a running 4-0 Monocryl as well.  Dermabond was then applied to both incisions.  The patient tolerated the procedure well.  All counts were correct at the end of the procedure.  She was then placed in a breast binder.  She was then extubated in the operating room and taken in a stable condition to the recovery room.          Ariel Ayala   Date: 12/24/2023  Time: 12:26 PM

## 2023-12-24 NOTE — Transfer of Care (Signed)
 Immediate Anesthesia Transfer of Care Note  Patient: Ariel Ayala  Procedure(s) Performed: RIGHT BREAST LUMPECTOMY WITH RADIOACTIVE SEED AND SENTINEL LYMPH NODE BIOPSY (Right: Breast)  Patient Location: PACU  Anesthesia Type:GA combined with regional for post-op pain  Level of Consciousness: awake, alert , oriented, patient cooperative, and responds to stimulation  Airway & Oxygen Therapy: Patient Spontanous Breathing and Patient connected to nasal cannula oxygen  Post-op Assessment: Report given to RN and Post -op Vital signs reviewed and stable  Post vital signs: Reviewed and stable  Last Vitals:  Vitals Value Taken Time  BP 137/96 12/24/23 1235  Temp    Pulse 100 12/24/23 1237  Resp 16 12/24/23 1237  SpO2 97 % 12/24/23 1237  Vitals shown include unfiled device data.  Last Pain:  Vitals:   12/24/23 1013  TempSrc:   PainSc: 2       Patients Stated Pain Goal: 1 (12/24/23 1013)  Complications: No notable events documented.

## 2023-12-24 NOTE — Anesthesia Procedure Notes (Signed)
 Procedure Name: LMA Insertion Date/Time: 12/24/2023 11:30 AM  Performed by: Jolynn Mage, CRNAPre-anesthesia Checklist: Patient identified, Emergency Drugs available, Suction available and Patient being monitored Patient Re-evaluated:Patient Re-evaluated prior to induction Oxygen Delivery Method: Circle system utilized Preoxygenation: Pre-oxygenation with 100% oxygen Induction Type: IV induction Ventilation: Mask ventilation without difficulty LMA: LMA flexible inserted LMA Size: 4.0 Number of attempts: 1 Placement Confirmation: positive ETCO2 and breath sounds checked- equal and bilateral Tube secured with: Tape Dental Injury: Teeth and Oropharynx as per pre-operative assessment

## 2023-12-24 NOTE — Anesthesia Procedure Notes (Signed)
Anesthesia Regional Block: Pectoralis block   Pre-Anesthetic Checklist: , timeout performed,  Correct Patient, Correct Site, Correct Laterality,  Correct Procedure, Correct Position, site marked,  Risks and benefits discussed,  Pre-op evaluation,  At surgeon's request and post-op pain management  Laterality: Right  Prep: Maximum Sterile Barrier Precautions used, chloraprep       Needles:  Injection technique: Single-shot  Needle Type: Echogenic Stimulator Needle     Needle Length: 9cm  Needle Gauge: 21     Additional Needles:   Procedures:,,,, ultrasound used (permanent image in chart),,    Narrative:  Start time: 12/24/2023 10:34 AM End time: 12/24/2023 10:44 AM Injection made incrementally with aspirations every 5 mL.  Performed by: Personally  Anesthesiologist: Gaynelle Adu, MD

## 2023-12-24 NOTE — Discharge Instructions (Signed)
[facility] Office Phone Number [phone]  BREAST BIOPSY/ PARTIAL MASTECTOMY: POST OP INSTRUCTIONS  Always review your discharge instruction sheet given to you by the facility where your surgery was performed.  IF YOU HAVE DISABILITY OR FAMILY LEAVE FORMS, YOU MUST BRING THEM TO THE OFFICE FOR PROCESSING.  DO NOT GIVE THEM TO YOUR DOCTOR.  A prescription for pain medication may be given to you upon discharge.  Take your pain medication as prescribed, if needed.  If narcotic pain medicine is not needed, then you may take acetaminophen (Tylenol) or ibuprofen (Advil) as needed. Take your usually prescribed medications unless otherwise directed If you need a refill on your pain medication, please contact your pharmacy.  They will contact our office to request authorization.  Prescriptions will not be filled after 5pm or on week-ends. You should eat very light the first 24 hours after surgery, such as soup, crackers, pudding, etc.  Resume your normal diet the day after surgery. Most patients will experience some swelling and bruising in the breast.  Ice packs and a good support bra will help.  Swelling and bruising can take several days to resolve.  It is common to experience some constipation if taking pain medication after surgery.  Increasing fluid intake and taking a stool softener will usually help or prevent this problem from occurring.  A mild laxative (Milk of Magnesia or Miralax) should be taken according to package directions if there are no bowel movements after 48 hours. Unless discharge instructions indicate otherwise, you may remove your bandages 24-48 hours after surgery, and you may shower at that time.  You may have steri-strips (small skin tapes) in place directly over the incision.  These strips should be left on the skin for 7-10 days.  If your surgeon used skin glue on the incision, you may shower in 24 hours.  The glue will flake off over the next 2-3 weeks.  Any  sutures or staples will be removed at the office during your follow-up visit. ACTIVITIES:  You may resume regular daily activities (gradually increasing) beginning the next day.  Wearing a good support bra or sports bra minimizes pain and swelling.  You may have sexual intercourse when it is comfortable. You may drive when you no longer are taking prescription pain medication, you can comfortably wear a seatbelt, and you can safely maneuver your car and apply brakes. RETURN TO WORK:  ______________________________________________________________________________________ You should see your doctor in the office for a follow-up appointment approximately two weeks after your surgery.  Your doctor's nurse will typically make your follow-up appointment when she calls you with your pathology report.  Expect your pathology report 2-3 business days after your surgery.  You may call to check if you do not hear from us after three days. OTHER INSTRUCTIONS: YOU MAY REMOVE THE BINDER AND SHOWER STARTING TOMORROW THEN PUT THE BINDER BACK ON _ICE PACK, TYLENOL, AND IBUPROFEN ALSO FOR  PAIN NO VIGOROUS ACTIVITY FOR ONE WEEK ______________________________________________________________________________________________ _____________________________________________________________________________________________________________________________________ _____________________________________________________________________________________________________________________________________ _____________________________________________________________________________________________________________________________________  WHEN TO CALL YOUR DOCTOR: Fever over 101.0 Nausea and/or vomiting. Extreme swelling or bruising. Continued bleeding from incision. Increased pain, redness, or drainage from the incision.  The clinic staff is available to answer your questions during regular business hours.  Please don't hesitate to call  and ask to speak to one of the nurses for clinical concerns.  If you have a medical emergency, go to the nearest emergency room or call 911.  A surgeon from [facility] is always   on call at the hospital.  For further questions, please visit [email or web address]  

## 2023-12-24 NOTE — Anesthesia Postprocedure Evaluation (Signed)
 Anesthesia Post Note  Patient: Elanore L Spatafore  Procedure(s) Performed: RIGHT BREAST LUMPECTOMY WITH RADIOACTIVE SEED AND SENTINEL LYMPH NODE BIOPSY (Right: Breast)     Patient location during evaluation: PACU Anesthesia Type: General and Regional Level of consciousness: awake and alert Pain management: pain level controlled Vital Signs Assessment: post-procedure vital signs reviewed and stable Respiratory status: spontaneous breathing, nonlabored ventilation and respiratory function stable Cardiovascular status: blood pressure returned to baseline and stable Postop Assessment: no apparent nausea or vomiting Anesthetic complications: no  No notable events documented.  Last Vitals:  Vitals:   12/24/23 1245 12/24/23 1300  BP: (!) 130/92 125/82  Pulse: 93 85  Resp: 15 12  Temp:  36.4 C  SpO2: 100% 99%    Last Pain:  Vitals:   12/24/23 1300  TempSrc:   PainSc: Asleep                 Nkosi Cortright,W. EDMOND

## 2023-12-24 NOTE — Interval H&P Note (Signed)
 History and Physical Interval Note:no change in H and P  12/24/2023 10:47 AM  Ariel Ayala  has presented today for surgery, with the diagnosis of RIGHT BREAST CANCER.  The various methods of treatment have been discussed with the patient and family. After consideration of risks, benefits and other options for treatment, the patient has consented to  Procedure(s): RIGHT BREAST LUMPECTOMY WITH RADIOACTIVE SEED AND SENTINEL LYMPH NODE BIOPSY (Right) as a surgical intervention.  The patient's history has been reviewed, patient examined, no change in status, stable for surgery.  I have reviewed the patient's chart and labs.  Questions were answered to the patient's satisfaction.     Vicenta Poli

## 2023-12-25 ENCOUNTER — Encounter (HOSPITAL_COMMUNITY): Payer: Self-pay | Admitting: Surgery

## 2023-12-25 ENCOUNTER — Encounter: Payer: Managed Care, Other (non HMO) | Admitting: Rehabilitative and Restorative Service Providers"

## 2023-12-29 ENCOUNTER — Encounter: Payer: Self-pay | Admitting: Hematology and Oncology

## 2023-12-29 ENCOUNTER — Encounter: Payer: Self-pay | Admitting: *Deleted

## 2023-12-29 ENCOUNTER — Telehealth: Payer: Self-pay | Admitting: *Deleted

## 2023-12-29 LAB — SURGICAL PATHOLOGY

## 2023-12-29 NOTE — Telephone Encounter (Signed)
 Received order for Oncotype Testing per Dr. Al Pimple. Requisition sent to pathology

## 2023-12-30 ENCOUNTER — Inpatient Hospital Stay: Payer: Managed Care, Other (non HMO) | Attending: Hematology and Oncology | Admitting: Licensed Clinical Social Worker

## 2023-12-30 ENCOUNTER — Telehealth (INDEPENDENT_AMBULATORY_CARE_PROVIDER_SITE_OTHER): Payer: Managed Care, Other (non HMO) | Admitting: Medical

## 2023-12-30 VITALS — Wt 159.0 lb

## 2023-12-30 DIAGNOSIS — J029 Acute pharyngitis, unspecified: Secondary | ICD-10-CM

## 2023-12-30 DIAGNOSIS — M7989 Other specified soft tissue disorders: Secondary | ICD-10-CM | POA: Diagnosis not present

## 2023-12-30 DIAGNOSIS — Z9889 Other specified postprocedural states: Secondary | ICD-10-CM

## 2023-12-30 DIAGNOSIS — U071 COVID-19: Secondary | ICD-10-CM | POA: Diagnosis not present

## 2023-12-30 MED ORDER — BENZONATATE 200 MG PO CAPS: 200.0000 mg | ORAL_CAPSULE | Freq: Three times a day (TID) | ORAL | 0 refills | Status: DC | PRN

## 2023-12-30 NOTE — Progress Notes (Signed)
[facility] CSW Counseling Note  Patient was referred by self. Treatment type: Individual  Presenting Concerns: Patient and/or family reports the following symptoms/concerns: stress Duration of problem: 3 weeks; Severity of problem: moderate   Orientation:oriented to person, place, time/date, and situation.   Affect: Appropriate and Congruent Risk of harm to self or others: No plan to harm self or others  Patient and/or Family's Strengths/Protective Factors: Social connections, Social and Emotional competence, and Concrete supports in place (healthy food, safe environments, etc.)Ability for insight  Capable of independent living  Communication skills  Motivation for treatment/growth  Supportive family/friends      Goals Addressed: Patient will:  Reduce symptoms of: stress Increase healthy adjustment to current life circumstances   Progress towards Goals: Initial   Interventions: Interventions utilized:  Strength-based, Supportive, and Other: educational       Assessment: Patient had surgery last week (2/5) and is recovering, but unfortunately also now has Covid. Short visit today due to patient not feeling well and needing to rest.  Overall, pt's anxiety level has decreased and she is happy she had surgery early and results have been promising (no positive nodes, clear margins).  CSW and pt reviewed what to expect and prepare for mentally for next steps.  CSW encouraged pt to continue taking good care of herself.  One struggle currently is adjusting to seeing the physical change in the breast. Discussed how it will likely improve over time as it heals and that there are ways to reframe thinking about the scars and changes.      Plan: Follow up with CSW: 2 weeks Behavioral recommendations: continue to rest and take care of yourself as you recover from surgery and Covid Referral(s): none at this time       Ariel Ayala E Ariel Corkery, LCSW

## 2023-12-30 NOTE — Progress Notes (Signed)
Subjective:     Patient ID: Ariel Ayala, female   DOB: May 03, 1976, 48 y.o.   MRN: [id number]  This visit type was conducted due to national recommendations for restrictions regarding the COVID-19 Pandemic (e.g. social distancing) in an effort to limit this patient's exposure and mitigate transmission in our community.  Due to their co-morbid illnesses, this patient is at least at moderate risk for complications without adequate follow up.  This format is felt to be most appropriate for this patient at this time.    Documentation for virtual audio and video telecommunications through [place] encounter:  The patient was located at home. The provider was located in the office. The patient did consent to this visit and is aware of possible charges through their insurance for this visit.  The other persons participating in this telemedicine service were none. Time spent on call was 20 minutes and in review of previous records 20 minutes total.  This virtual service is not related to other E/M service within previous 7 days.   HPI Chief Complaint  Patient presents with   Covid Positive    Covid positive yesterday, last Wednesday at had surgery and had a tube down throat, symptoms started Saturday,  Fever, cough, sore throat, ear clogged   Virtual consult for illness.  Symptoms started 3 days ago and she tested positive for COVID yesterday.  Her symptoms include sore throat, cough, congestion, nasal congestion, ears feel plugged up.  No short of breath or wheezing.  No nausea or vomiting.  She did have some loose stool a few days ago.  Has had some fever.  Has never had COVID before.  She is using Chloraseptic spray for her throat and using some over-the-counter Mucinex cough and cold for symptoms.  The cough medicine is not working all that well.  She also is postop.  6 days ago she noted breast lumpectomy and lymph node dissection for recent diagnosis of breast cancer.  She feels some  irritation overall in the throat from where she had intubation.  She denies leg pain or swelling, no palpitations, no fast heart rate.  No other concerns.  She had pneumonia last summer and got over that about 2 weeks.  She usually can fight off illness okay.  No other aggravating or relieving factors. No other complaint.  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     ibuprofen (ADVIL) 200 MG tablet Take 400 mg by mouth 2 (two) times daily as needed for headache or moderate pain (pain score 4-6).     Melatonin 10 MG CAPS Take 10 mg by mouth at bedtime as needed (sleep).     Multiple Vitamin (MULTIVITAMIN) tablet Take 1 tablet by mouth daily.     naproxen sodium (ALEVE) 220 MG tablet Take 220 mg by mouth daily as needed (pain, headache).     sertraline (ZOLOFT) 25 MG tablet Take 25 mg by mouth daily.     cetirizine (ZYRTEC) 10  MG tablet Take 10 mg by mouth daily as needed for allergies. (Patient not taking: Reported on 12/30/2023)     Cholecalciferol (VITAMIN D-3 PO) Take 1 capsule by mouth daily. (Patient not taking: Reported on 12/30/2023)     rosuvastatin (CRESTOR) 5 MG tablet TAKE 1 TABLET (5 MG TOTAL) BY MOUTH DAILY. (Patient not taking: Reported on 12/30/2023) 90 tablet 1   traMADol (ULTRAM) 50 MG tablet Take 1 tablet (50 mg total) by mouth every 6 (six) hours as needed for moderate pain (pain score 4-6) or severe pain (pain score 7-10). (Patient not taking: Reported on 12/30/2023) 25 tablet 0   No current  facility-administered medications on file prior to visit.     Review of Systems As in subjective    Objective:   Physical Exam Due to coronavirus pandemic stay at home measures, patient visit was virtual and they were not examined in person.   Wt 159 lb (72.1 kg)   LMP 12/03/2023 (Exact Date)   BMI 30.04 kg/m  General: Well-developed well-nourished no acute distress, mildly ill-appearing No labored breathing or wheezing Otherwise unable to examine other     Assessment:     Encounter Diagnoses  Name Primary?   COVID    Post-operative state    Axillary swelling    Sore throat Yes       Plan:     We discussed her symptoms and concerns, diagnosis of covid. Discussed optional Paxlovid treatment/benefits and risks.  She declines Paxlovid. Advised rest, hydration, can use over-the-counter analgesics for fever not feeling well, continue salt water gargles, warm fluids and Chloraseptic spray for the throat pain.  I recommend Tylenol or ibuprofen alternating to help with fever and sore throat not feeling well.  Can use tessalon perles prn. Discussed Flonase for nasal congestion or short-term use of Afrin if needed  Overall she seems to have a milder case of illness currently.  Advise if worse symptoms such as uncontrollable vomiting and nausea, extreme weakness, short of breath or other worsening symptoms then call or get reevaluated or go to the emergency department  We discussed the fact that she just had surgery about a week ago.  Discussed potential risk of blood clot or secondary changes in the lungs from surgery and also having COVID.  If any worse symptoms in the next week or if any signs or symptoms as discussed for blood clot then get reevaluated right away  We discussed her axillary fullness which may just be swelling secondary from recent surgery.  Advise she continue the cold therapy she is doing, use a cloth between the cold pack on her skin, relative rest.  If any signs  of infection such as redness, hardness, warmth then get reevaluated right away.  Ariel Ayala was seen today for covid positive.  Diagnoses and all orders for this visit:  Sore throat  COVID  Post-operative state  Axillary swelling  Other orders -     benzonatate (TESSALON) 200 MG capsule; Take 1 capsule (200 mg total) by mouth 3 (three) times daily as needed for cough.  F/u prn

## 2023-12-30 NOTE — Progress Notes (Deleted)
  Subjective:     Patient ID: Ariel Ayala, female   DOB: [birth year], 48 y.o.   MRN: [id number]  This visit type was conducted due to national recommendations for restrictions regarding the COVID-19 Pandemic (e.g. social distancing) in an effort to limit this patient's exposure and mitigate transmission in our community.  Due to their co-morbid illnesses, this patient is at least at moderate risk for complications without adequate follow up.  This format is felt to be most appropriate for this patient at this time.    Documentation for virtual audio and video telecommunications through [place] encounter:  The patient was located at home. The provider was located in the office. The patient did consent to this visit and is aware of possible charges through their insurance for this visit.  The other persons participating in this telemedicine service were none. Time spent on call was 20 minutes and in review of previous records 20 minutes total.  This virtual service is not related to other E/M service within previous 7 days.   HPI Chief Complaint  Patient presents with   Covid Positive    Covid positive yesterday, last Wednesday at had surgery and had a tube down throat, symptoms started Saturday,  Fever, cough, sore throat, ear clogged   Virtual consult for illness.  She notes symptoms include bad congestion, cough, sore throat, fever,   Had surgery last week about 6 days, breast lumpectomy and lymph node resection.  Throat alrady hurt some from intubation.   Using chloraseptci.    Low grade fever msotly overnight.  Has had a few loose stools 3 days ago but no nausea or vomiting.   No SOB or wheezing.    No prior covid.  Using mucinex cough and cold.  No prior blood clots.  Had pneumonia in August 2024.  1-2 weeks.      Review of Systems As in subjective    Objective:   Physical Exam Due to coronavirus pandemic stay at home measures, patient visit was virtual  and they were not examined in person.   ***     Assessment:     ***    Plan:     ***  There are no diagnoses linked to this encounter.

## 2024-01-05 NOTE — Telephone Encounter (Signed)
 I've tried to call 3 times fast busy.

## 2024-01-06 ENCOUNTER — Encounter (HOSPITAL_COMMUNITY): Payer: Self-pay

## 2024-01-06 ENCOUNTER — Encounter: Payer: Self-pay | Admitting: Nurse Practitioner

## 2024-01-06 ENCOUNTER — Ambulatory Visit (INDEPENDENT_AMBULATORY_CARE_PROVIDER_SITE_OTHER): Payer: Managed Care, Other (non HMO) | Admitting: Nurse Practitioner

## 2024-01-06 VITALS — BP 122/74 | HR 93 | Wt 160.8 lb

## 2024-01-06 DIAGNOSIS — N951 Menopausal and female climacteric states: Secondary | ICD-10-CM

## 2024-01-06 DIAGNOSIS — C50411 Malignant neoplasm of upper-outer quadrant of right female breast: Secondary | ICD-10-CM | POA: Diagnosis not present

## 2024-01-06 DIAGNOSIS — J189 Pneumonia, unspecified organism: Secondary | ICD-10-CM | POA: Insufficient documentation

## 2024-01-06 DIAGNOSIS — Z170 Estrogen receptor positive status [ER+]: Secondary | ICD-10-CM

## 2024-01-06 HISTORY — DX: Pneumonia, unspecified organism: J18.9

## 2024-01-06 MED ORDER — AZITHROMYCIN 200 MG/5ML PO SUSR: 500.0000 mg | Freq: Every day | ORAL | 0 refills | Status: AC

## 2024-01-06 MED ORDER — FLUCONAZOLE 150 MG PO TABS
ORAL_TABLET | ORAL | 0 refills | Status: DC
Start: 2024-01-06 — End: 2024-03-09

## 2024-01-06 MED ORDER — AMOXICILLIN-POT CLAVULANATE 400-57 MG PO CHEW: 2.0000 | CHEWABLE_TABLET | Freq: Two times a day (BID) | ORAL | 0 refills | Status: AC

## 2024-01-06 MED ORDER — METHYLPREDNISOLONE SODIUM SUCC 125 MG IJ SOLR
125.0000 mg | Freq: Once | INTRAMUSCULAR | Status: AC
  Administered 2024-01-06: 125 mg via INTRAMUSCULAR

## 2024-01-06 NOTE — Assessment & Plan Note (Signed)
 Experiencing significant perimenopausal symptoms. Initially started on birth control but had to discontinue due to hormonal nature of breast cancer. Currently on Zoloft for mood stabilization. Discussed the impact of hormonal changes on emotional well-being and the decision to use Zoloft for mood stabilization. - Continue Zoloft for mood stabilization - Monitor and manage perimenopausal symptoms as needed

## 2024-01-06 NOTE — Patient Instructions (Signed)
 I have sent the antibiotic to the pharmacy for you. Both are for 10 days.   I ordered the chest x-ray. You can have this done in about 7-10 days so we can make sure the infection is cleared. If you feel 100% better and are not having any more symptoms you can opt not to have this done if you would like.   Otherwise, you can walk in to [facility] at [employer].

## 2024-01-06 NOTE — Progress Notes (Signed)
 Tollie Eth, DNP, AGNP-c [facility] [address] [place], [place] [address] [phone]   ACUTE VISIT- ESTABLISHED PATIENT  Blood pressure 122/74, pulse 93, weight 160 lb 12.8 oz (72.9 kg), last menstrual period 12/03/2023, SpO2 97%.  Subjective:  HPI Ariel Ayala is a 48 y.o. female presents to day for evaluation of acute concern(s).   History of Present Illness Ariel Ayala is a 48 year old female with breast cancer who presents with post-surgical illness and chest congestion.  Approximately two weeks ago, she underwent a lumpectomy and lymph node removal for breast cancer. Post-surgery, she began experiencing chest congestion and a productive cough. She tested positive for COVID-19 after her cousin, who stayed with her post-surgery, fell ill but did not test positive. Her daughter had mild throat symptoms but also did not test positive.  She describes her cough as productive with yellow sputum and has used her inhaler a couple of times. She is concerned about the congestion moving into her chest. She has a history of asthma and has taken prednisone in the past. No fever was mentioned.  Post-surgery, she experienced severe nerve pain in her arm, described as a burning sensation in her armpit and electric shock-like feelings, which she attributes to nerve issues following the lymph node removal. She was prescribed gabapentin for this pain.  She is currently on Zoloft for mood stabilization after being advised to stop birth control due to her hormone-sensitive cancer. She also mentions a history of perimenopausal symptoms. She is taking antibiotics for her current illness and has been prescribed Diflucan to manage potential yeast infections from antibiotic use. She has difficulty swallowing pills due to a sore throat post-intubation, preferring liquid or chewable medications.  ROS negative except for what is listed in HPI. History, Medications, Surgery, SDOH,  and Family History reviewed and updated as appropriate.  Objective:  Physical Exam Vitals and nursing note reviewed.  Constitutional:      General: She is not in acute distress.    Appearance: She is not ill-appearing.  HENT:     Head: Normocephalic.  Eyes:     Conjunctiva/sclera: Conjunctivae normal.  Cardiovascular:     Rate and Rhythm: Normal rate and regular rhythm.     Pulses: Normal pulses.     Heart sounds: Normal heart sounds.  Pulmonary:     Effort: Pulmonary effort is normal.     Breath sounds: Rhonchi present.  Chest:     Chest wall: No tenderness.  Musculoskeletal:     Right lower leg: No edema.     Left lower leg: No edema.  Lymphadenopathy:     Cervical: No cervical adenopathy.  Skin:    General: Skin is warm and dry.     Capillary Refill: Capillary refill takes less than 2 seconds.  Neurological:     Mental Status: She is alert and oriented to person, place, and time.  Psychiatric:        Mood and Affect: Mood normal.        Behavior: Behavior normal.         Assessment & Plan:   Problem List Items Addressed This Visit     Perimenopause   Experiencing significant perimenopausal symptoms. Initially started on birth control but had to discontinue due to hormonal nature of breast cancer. Currently on Zoloft for mood stabilization. Discussed the impact of hormonal changes on emotional well-being and the decision to use Zoloft for mood stabilization. - Continue Zoloft for mood stabilization -  Monitor and manage perimenopausal symptoms as needed      Malignant neoplasm of upper-outer quadrant of right breast in female, estrogen receptor positive (HCC)   Diagnosed with breast cancer in January. Underwent lumpectomy and lymph node removal. Awaiting oncotype score to determine further treatment (radiation vs. chemotherapy and radiation). Experiencing nerve pain in the armpit and arm post-surgery, likely due to nerve damage. Currently on gabapentin for nerve pain.  Emotional changes noted due to rapid progression of events and hormonal changes. Explained that nerve pain may take time to resolve as nerves regenerate slowly. - Continue gabapentin for nerve pain - Await oncotype score for further treatment planning - Provide emotional support and counseling as needed      Relevant Medications   azithromycin (ZITHROMAX) 200 MG/5ML suspension   amoxicillin-clavulanate (AUGMENTIN) 400-57 MG chewable tablet   fluconazole (DIFLUCAN) 150 MG tablet   Community acquired pneumonia - Primary   Tested positive for COVID-19 post-surgery. Symptoms include productive cough with yellow sputum, sore throat, nasal congestion, and chest congestion. Lung auscultation revealed crackles in the left lower and right middle lobes, suggesting walking pneumonia secondary to recent viral infection. Stress and recent surgery likely contributing to weakened immune system. Discussed treatment plan involving antibiotics and steroids. Explained the option of a chest x-ray to assess the extent of infection and the decision to start antibiotics immediately. - Administer steroid shot for inflammation - Prescribe azithromycin (Z-Pak) and amoxicillin in liquid form - Order chest x-ray in 7-10 days to assess resolution of infection - Prescribe Diflucan for potential yeast infection due to antibiotics      Relevant Medications   azithromycin (ZITHROMAX) 200 MG/5ML suspension   amoxicillin-clavulanate (AUGMENTIN) 400-57 MG chewable tablet   fluconazole (DIFLUCAN) 150 MG tablet   Other Relevant Orders   DG Chest 2 View      Tollie Eth, DNP, AGNP-c

## 2024-01-06 NOTE — Assessment & Plan Note (Signed)
 Diagnosed with breast cancer in January. Underwent lumpectomy and lymph node removal. Awaiting oncotype score to determine further treatment (radiation vs. chemotherapy and radiation). Experiencing nerve pain in the armpit and arm post-surgery, likely due to nerve damage. Currently on gabapentin for nerve pain. Emotional changes noted due to rapid progression of events and hormonal changes. Explained that nerve pain may take time to resolve as nerves regenerate slowly. - Continue gabapentin for nerve pain - Await oncotype score for further treatment planning - Provide emotional support and counseling as needed

## 2024-01-06 NOTE — Assessment & Plan Note (Signed)
 Tested positive for COVID-19 post-surgery. Symptoms include productive cough with yellow sputum, sore throat, nasal congestion, and chest congestion. Lung auscultation revealed crackles in the left lower and right middle lobes, suggesting walking pneumonia secondary to recent viral infection. Stress and recent surgery likely contributing to weakened immune system. Discussed treatment plan involving antibiotics and steroids. Explained the option of a chest x-ray to assess the extent of infection and the decision to start antibiotics immediately. - Administer steroid shot for inflammation - Prescribe azithromycin (Z-Pak) and amoxicillin in liquid form - Order chest x-ray in 7-10 days to assess resolution of infection - Prescribe Diflucan for potential yeast infection due to antibiotics

## 2024-01-07 ENCOUNTER — Encounter: Payer: Self-pay | Admitting: *Deleted

## 2024-01-07 ENCOUNTER — Telehealth: Payer: Self-pay | Admitting: *Deleted

## 2024-01-07 DIAGNOSIS — C50411 Malignant neoplasm of upper-outer quadrant of right female breast: Secondary | ICD-10-CM

## 2024-01-07 NOTE — Telephone Encounter (Signed)
Received oncotype results of 15/4%.  Patient is aware. Referral placed for Dr. Lisbeth Renshaw

## 2024-01-08 ENCOUNTER — Encounter: Payer: Self-pay | Admitting: Nurse Practitioner

## 2024-01-09 ENCOUNTER — Encounter: Payer: Self-pay | Admitting: *Deleted

## 2024-01-12 ENCOUNTER — Ambulatory Visit: Payer: Managed Care, Other (non HMO) | Admitting: Physical Therapy

## 2024-01-13 ENCOUNTER — Inpatient Hospital Stay: Payer: Managed Care, Other (non HMO) | Admitting: Licensed Clinical Social Worker

## 2024-01-13 NOTE — Progress Notes (Signed)
 [facility] CSW Counseling Note  Patient was referred by self. Treatment type: Individual  Presenting Concerns: Patient and/or family reports the following symptoms/concerns: stress Duration of problem: 2 months; Severity of problem: moderate   Orientation:oriented to person, place, time/date, and situation.   Affect: Appropriate and Congruent Risk of harm to self or others: No plan to harm self or others  Patient and/or Family's Strengths/Protective Factors: Social connections, Social and Emotional competence, and Concrete supports in place (healthy food, safe environments, etc.)Ability for insight  Capable of independent living  Communication skills  Motivation for treatment/growth  Supportive family/friends      Goals Addressed: Patient will:  Reduce symptoms of: stress Increase healthy adjustment to current life circumstances   Progress towards Goals: Progressing   Interventions: Interventions utilized:  Strength-based, Supportive, and Other: educational       Assessment: Patient continues to recover from surgery as well as illnesses (Covid, pneumonia). Spent time today discussing sitting in the unknown and discomfort of that in regard to how she will feel going back to work and when she starts radiation.  Reframed guilt around other people having to step up at work (not something she chose, better to take care of herself now, so she can be present long-term).  Pt was able to go Sunday to where she likes to watch soccer and saw a friend which helped her mental wellbeing.      Plan: Follow up with CSW: 2 weeks Behavioral recommendations: take deep breaths when anxiety increases. Work on being comfortable with the uncomfortable. Take the next steps a day at a time to see how you feel Referral(s): none at this time       Ryenne Lynam E Bennette Hasty, LCSW

## 2024-01-14 ENCOUNTER — Ambulatory Visit
Admission: RE | Admit: 2024-01-14 | Discharge: 2024-01-14 | Disposition: A | Discharge status: Home / Self Care | Payer: Managed Care, Other (non HMO) | Source: Ambulatory Visit | Attending: Nurse Practitioner | Admitting: Nurse Practitioner

## 2024-01-14 DIAGNOSIS — J189 Pneumonia, unspecified organism: Secondary | ICD-10-CM

## 2024-01-15 ENCOUNTER — Encounter: Payer: Self-pay | Admitting: Radiation Oncology

## 2024-01-15 ENCOUNTER — Ambulatory Visit
Admission: RE | Admit: 2024-01-15 | Discharge: 2024-01-15 | Disposition: A | Discharge status: Home / Self Care | Payer: Managed Care, Other (non HMO) | Source: Ambulatory Visit | Attending: Radiation Oncology | Admitting: Radiation Oncology

## 2024-01-15 VITALS — BP 126/85 | HR 80 | Temp 98.0°F | Resp 18 | Ht 61.0 in | Wt 160.0 lb

## 2024-01-15 DIAGNOSIS — C50411 Malignant neoplasm of upper-outer quadrant of right female breast: Secondary | ICD-10-CM | POA: Insufficient documentation

## 2024-01-15 DIAGNOSIS — Z170 Estrogen receptor positive status [ER+]: Secondary | ICD-10-CM | POA: Diagnosis present

## 2024-01-15 DIAGNOSIS — Z79899 Other long term (current) drug therapy: Secondary | ICD-10-CM | POA: Insufficient documentation

## 2024-01-15 DIAGNOSIS — Z803 Family history of malignant neoplasm of breast: Secondary | ICD-10-CM | POA: Insufficient documentation

## 2024-01-15 DIAGNOSIS — J45909 Unspecified asthma, uncomplicated: Secondary | ICD-10-CM | POA: Diagnosis not present

## 2024-01-15 DIAGNOSIS — Z800 Family history of malignant neoplasm of digestive organs: Secondary | ICD-10-CM | POA: Insufficient documentation

## 2024-01-15 DIAGNOSIS — C50412 Malignant neoplasm of upper-outer quadrant of left female breast: Secondary | ICD-10-CM | POA: Insufficient documentation

## 2024-01-15 NOTE — Progress Notes (Signed)
 Nursing interview for Malignant neoplasm of upper-outer quadrant of right breast in female, estrogen receptor positive (HCC) Stage IA (cT1b, cN0, cM0, G1, ER+, PR+, HER2-)   Patient identity verified x2.  Patient reports RT breast tenderness 1/10 at the incision line, some nerve discomfort under the RT arm/axilla (possible cording) and some thickening superior to the incision line of the RT breast. Patient denies chest pain, SOB, and dizziness. No other related issues reported at this time.  Meaningful use complete.  Vitals- BP 126/85 (BP Location: Left Arm, Patient Position: Sitting, Cuff Size: Normal)   Pulse 80   Temp 98 F (36.7 C) (Temporal)   Resp 18   Ht 5\' 1"  (1.549 m)   Wt 160 lb (72.6 kg)   LMP 12/03/2023 (Exact Date)   SpO2 100%   BMI 30.23 kg/m   This concludes the interaction.  Ruel Favors, LPN

## 2024-01-15 NOTE — Progress Notes (Signed)
 Radiation Oncology         [phone] ________________________________  Name: Ariel Ayala        MRN: [id number]  Date of Service: 01/15/2024 DOB: 16-Mar-1976  WU:[id number], Sung Amabile, NP  Rachel Moulds, MD     REFERRING PHYSICIAN: Rachel Moulds, MD   DIAGNOSIS: The encounter diagnosis was Malignant neoplasm of upper-outer quadrant of right breast in female, estrogen receptor positive (HCC).   HISTORY OF PRESENT ILLNESS: Ariel Ayala is a 48 y.o. female originally  seen in the multidisciplinary breast clinic for a new diagnosis of right breast cancer. The patient was noted to have screening detected mass in the right breast. Further diagnostic work up on 11/26/23 showed persistent focal distortion in the 12:00 position, and by ultrasound showed a mass in the 12:00 position measuring 9 mm in greatest dimension. Her axilla was negative for adenopathy. 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   Since her last visit, she underwent a right lumpectomy with sentinel node biopsy with Dr. Magnus Ivan on 12/24/23 which showed a grade 1, invasive ductal carcinoma measuring 1.8 cm. Her margins were negative, and 3 sentinel nodes were negative for disease. Oncotype Dx was performed and confirmed her tumor to be ER/PR positive, HER2 negative and her recurrence score was 15. She is seen to revisit adjuvant radiation.     PREVIOUS RADIATION THERAPY: No   PAST MEDICAL HISTORY:  Past Medical History:  Diagnosis Date   Asthma    as a child   Bloating 10/23/2020   CMV (cytomegalovirus infection) (HCC)    Early satiety 10/23/2020   Generalized abdominal pain 10/23/2020   Hair loss 03/12/2023   Indigestion 10/23/2020   Invasive ductal carcinoma of breast, female, 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   Splenomegaly    on CT   Vitamin D deficiency 10/25/2020       PAST SURGICAL HISTORY: Past Surgical History:  Procedure  Laterality Date   BREAST BIOPSY Right 11/28/2023   MM RT BREAST BX W LOC DEV 1ST LESION IMAGE BX SPEC STEREO GUIDE 11/28/2023 [facility]   BREAST BIOPSY  12/23/2023   MM RT RADIOACTIVE SEED LOC MAMMO GUIDE 12/23/2023 [facility]   BREAST LUMPECTOMY WITH RADIOACTIVE SEED AND SENTINEL LYMPH NODE BIOPSY Right 12/24/2023   Procedure: RIGHT BREAST LUMPECTOMY WITH RADIOACTIVE SEED AND SENTINEL LYMPH NODE BIOPSY;  Surgeon: Abigail Miyamoto, MD;  Location: [facility] OR;  Service: General;  Laterality: Right;   DILATION AND CURETTAGE OF UTERUS     FOOT SURGERY Right    with hardware   trigger thumb Right      FAMILY HISTORY:  Family History  Problem Relation Age of Onset   Diabetes Mother    Thyroid disease Mother    Suicidality Father    Breast cancer Maternal Grandmother        dx 73s-70s   Colon cancer Cousin        dx 29s   Skin cancer Cousin        [facility]     SOCIAL HISTORY:  reports that she has never smoked. She has never used smokeless tobacco. She reports current alcohol use. She reports that she does not use drugs. The patient is single and lives in [place]. She is accompanied by her cousin. 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 She enjoys watching soccer.    ALLERGIES: Sulfa antibiotics and  Macrobid [nitrofurantoin]   MEDICATIONS:  Current Outpatient Medications  Medication Sig Dispense Refill   benzonatate (TESSALON) 200 MG capsule Take 1 capsule (200 mg total) by mouth 3 (three) times daily as needed for cough. (Patient not taking: Reported on 01/15/2024) 30 capsule 0   cetirizine (ZYRTEC) 10 MG tablet Take 10 mg by mouth daily as needed for allergies.     Cholecalciferol (VITAMIN D-3 PO) Take 1 capsule by mouth daily.     Cyanocobalamin (VITAMIN B-12 PO) Take 1 tablet by mouth daily.     fluconazole (DIFLUCAN) 150 MG tablet Take one tablet at the first sign of yeast. May repeat in 3 days if symptoms persist. 2 tablet 0    gabapentin (NEURONTIN) 100 MG capsule Take 100 mg by mouth 3 (three) times daily.     ibuprofen (ADVIL) 200 MG tablet Take 400 mg by mouth 2 (two) times daily as needed for headache or moderate pain (pain score 4-6).     Melatonin 10 MG CAPS Take 10 mg by mouth at bedtime as needed (sleep).     Multiple Vitamin (MULTIVITAMIN) tablet Take 1 tablet by mouth daily.     naproxen sodium (ALEVE) 220 MG tablet Take 220 mg by mouth daily as needed (pain, headache).     rosuvastatin (CRESTOR) 5 MG tablet TAKE 1 TABLET (5 MG TOTAL) BY MOUTH DAILY. (Patient not taking: Reported on 12/30/2023) 90 tablet 1   sertraline (ZOLOFT) 25 MG tablet Take 25 mg by mouth daily.     traMADol (ULTRAM) 50 MG tablet Take 1 tablet (50 mg total) by mouth every 6 (six) hours as needed for moderate pain (pain score 4-6) or severe pain (pain score 7-10). (Patient not taking: Reported on 01/06/2024) 25 tablet 0   No current facility-administered medications for this encounter.     REVIEW OF SYSTEMS: On review of systems, the patient reports that she is doing well. She's had cycles but her contraception is Essure. She denies current sexual activity. She's had firmness in her right breast, and soreness along the medial aspect of the incision site, as well as burning sensation in her right axilla since surgery. No other complaints are verbalized.      PHYSICAL EXAM:  Wt Readings from Last 3 Encounters:  01/15/24 160 lb (72.6 kg)  01/06/24 160 lb 12.8 oz (72.9 kg)  12/30/23 159 lb (72.1 kg)   Temp Readings from Last 3 Encounters:  01/15/24 98 F (36.7 C) (Temporal)  12/24/23 97.6 F (36.4 C)  12/23/23 98.6 F (37 C)   BP Readings from Last 3 Encounters:  01/15/24 126/85  01/06/24 122/74  12/24/23 125/82   Pulse Readings from Last 3 Encounters:  01/15/24 80  01/06/24 93  12/24/23 85    In general this is a well appearing caucasian female in no acute distress. She's alert and oriented x4 and appropriate throughout  the examination. Cardiopulmonary assessment is negative for acute distress and she exhibits normal effort. Her right breast and axilla revealed well healed surgical sites. No erythema, separation, or drainage was noted. She has mild fullness in the right upper arm possibly cording.    ECOG = 1  0 - Asymptomatic (Fully active, able to carry on all predisease activities without restriction)  1 - Symptomatic but completely ambulatory (Restricted in physically strenuous activity but ambulatory and able to carry out work of a light or sedentary nature. For example, light housework, office work)  2 - Symptomatic, <50% in bed during the day (  Ambulatory and capable of all self care but unable to carry out any work activities. Up and about more than 50% of waking hours)  3 - Symptomatic, >50% in bed, but not bedbound (Capable of only limited self-care, confined to bed or chair 50% or more of waking hours)  4 - Bedbound (Completely disabled. Cannot carry on any self-care. Totally confined to bed or chair)  5 - Death   Santiago Glad MM, Creech RH, Tormey DC, et al. [id number]). "Toxicity and response criteria of the [facility]". Am. Evlyn Clines. Oncol. 5 (6): 649-55    LABORATORY DATA:  Lab Results  Component Value Date   WBC 10.2 12/10/2023   HGB 11.9 (L) 12/10/2023   HCT 37.1 12/10/2023   MCV 80.0 12/10/2023   PLT 441 (H) 12/10/2023   Lab Results  Component Value Date   NA 141 12/10/2023   K 3.2 (L) 12/10/2023   CL 105 12/10/2023   CO2 28 12/10/2023   Lab Results  Component Value Date   ALT 6 12/10/2023   AST 10 (L) 12/10/2023   ALKPHOS 57 12/10/2023   BILITOT 0.4 12/10/2023      RADIOGRAPHY: MM Breast Surgical Specimen Result Date: 12/24/2023 CLINICAL DATA:  48 year old with biopsy-proven grade 1 invasive ductal carcinoma involving the RIGHT breast. Radioactive seed localization was performed yesterday in anticipation of today's lumpectomy. EXAM: SPECIMEN RADIOGRAPH OF  THE RIGHT BREAST COMPARISON:  Previous exam(s). FINDINGS: Status post excision of the RIGHT breast. The radioactive seed and the coil shaped biopsy marker clip are present in the specimen. The seed is intact. The tomosynthesis images confirm that the architectural distortion seen on mammography was removed. This was discussed by telephone with the operating room nurse at the time of interpretation on 12/24/2023 at 12:03 p.m. IMPRESSION: Specimen radiograph of the RIGHT breast. Electronically Signed   By: Hulan Saas M.D.   On: 12/24/2023 12:06   MM RT RADIOACTIVE SEED LOC MAMMO GUIDE Result Date: 12/23/2023 CLINICAL DATA:  48 year old female presenting for seed localization of the right breast. Patient has newly diagnosed right breast invasive ductal carcinoma. EXAM: MAMMOGRAPHIC GUIDED RADIOACTIVE SEED LOCALIZATION OF THE RIGHT BREAST COMPARISON:  Previous exam(s). FINDINGS: Patient presents for radioactive seed localization prior to right breast lumpectomy. I met with the patient and we discussed the procedure of seed localization including benefits and alternatives. We discussed the high likelihood of a successful procedure. We discussed the risks of the procedure including infection, bleeding, tissue injury and further surgery. We discussed the low dose of radioactivity involved in the procedure. Informed, written consent was given. The usual time-out protocol was performed immediately prior to the procedure. Using mammographic guidance, sterile technique, 1% lidocaine and an I-125 radioactive seed, the coil biopsy marking clip in the right breast was localized using a medial approach. The follow-up mammogram images confirm the seed in the expected location and were marked for Dr. Magnus Ivan. Follow-up survey of the patient confirms presence of the radioactive seed. Order number of I-125 seed:  [id number]. Total activity: 0.244 mCi reference Date: July 23, 2023 The patient tolerated the procedure well  and was [facility] from the [facility]. She was given instructions regarding seed removal. IMPRESSION: Radioactive seed localization right breast. No apparent complications. Electronically Signed   By: Emmaline Kluver M.D.   On: 12/23/2023 15:24       IMPRESSION/PLAN: 1. Stage IA, pT1cN0M0, grade 1 ER/PR positive invasive ductal carcinoma of the right breast. Dr. Mitzi Hansen has reviewed the final pathology  findings and we reviewed the nature of early stage breast cancer. She has done well since surgery and Dr. Al Pimple does not recommend chemotherapy based on Oncotype Dx score. Dr. Mitzi Hansen recommends external radiotherapy to the breast  to reduce risks of local recurrence. Dr. Al Pimple anticipates adjuvant antiestrogen therapy to follow. We discussed the risks, benefits, short, and long term effects of radiotherapy, as well as the curative intent, and the patient is interested in proceeding. I reviewed the delivery and logistics of radiotherapy and Dr. Mitzi Hansen recommends 4 weeks of radiotherapy to the right breast. Written consent is obtained and placed in the chart, a copy was provided to the patient. She will simulate this afternoon. 2. Contraceptive Counseling. The patient is perimenopausal and had negative pregnancy testing prior to surgery. She uses Essure as her contraception. She denies any recent sexual activity since surgery. No pregnancy testing is needed prior to proceeding.   In a visit lasting 60 minutes, greater than 50% of the time was spent face to face reviewing her case, as well as in preparation of, discussing, and coordinating the patient's care.      Osker Mason, [facility]    **Disclaimer: This note was dictated with voice recognition software. Similar sounding words can inadvertently be transcribed and this note may contain transcription errors which may not have been corrected upon publication of note.**

## 2024-01-19 ENCOUNTER — Encounter: Payer: Self-pay | Admitting: Rehabilitation

## 2024-01-19 ENCOUNTER — Telehealth: Payer: Self-pay

## 2024-01-19 ENCOUNTER — Ambulatory Visit: Payer: Managed Care, Other (non HMO) | Attending: Surgery | Admitting: Rehabilitation

## 2024-01-19 DIAGNOSIS — C50411 Malignant neoplasm of upper-outer quadrant of right female breast: Secondary | ICD-10-CM | POA: Diagnosis present

## 2024-01-19 DIAGNOSIS — G8929 Other chronic pain: Secondary | ICD-10-CM | POA: Diagnosis present

## 2024-01-19 DIAGNOSIS — M6281 Muscle weakness (generalized): Secondary | ICD-10-CM | POA: Diagnosis present

## 2024-01-19 DIAGNOSIS — M25511 Pain in right shoulder: Secondary | ICD-10-CM | POA: Insufficient documentation

## 2024-01-19 DIAGNOSIS — Z170 Estrogen receptor positive status [ER+]: Secondary | ICD-10-CM | POA: Diagnosis present

## 2024-01-19 DIAGNOSIS — Z483 Aftercare following surgery for neoplasm: Secondary | ICD-10-CM | POA: Diagnosis present

## 2024-01-19 DIAGNOSIS — Z9189 Other specified personal risk factors, not elsewhere classified: Secondary | ICD-10-CM | POA: Diagnosis present

## 2024-01-19 DIAGNOSIS — M25611 Stiffness of right shoulder, not elsewhere classified: Secondary | ICD-10-CM | POA: Diagnosis present

## 2024-01-19 NOTE — Telephone Encounter (Signed)
 The phone number is not in service at this time.. please update if patient arrives.Marland Kitchen

## 2024-01-19 NOTE — Therapy (Signed)
 OUTPATIENT PHYSICAL THERAPY BREAST CANCER POST OP FOLLOW UP   Patient Name: Ariel Ayala MRN: [id number] DOB:07/21/1976, 48 y.o., female Today's Date: 01/19/2024  END OF SESSION:  PT End of Session - 01/19/24 1149     Visit Number 1    Number of Visits 9    Date for PT Re-Evaluation 02/16/24    Authorization Type none needed    PT Start Time 1100    PT Stop Time 1149    PT Time Calculation (min) 49 min    Activity Tolerance Patient tolerated treatment well    Behavior During Therapy [facility] for tasks assessed/performed             Past Medical History:  Diagnosis Date   Asthma    as a child   Bloating 10/23/2020   CMV (cytomegalovirus infection) (HCC)    Early satiety 10/23/2020   Generalized abdominal pain 10/23/2020   Hair loss 03/12/2023   Indigestion 10/23/2020   Invasive ductal carcinoma of breast, female, 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   Splenomegaly    on CT   Vitamin D deficiency 10/25/2020   Past Surgical History:  Procedure Laterality Date   BREAST BIOPSY Right 11/28/2023   MM RT BREAST BX W LOC DEV 1ST LESION IMAGE BX SPEC STEREO GUIDE 11/28/2023 [facility]   BREAST BIOPSY  12/23/2023   MM RT RADIOACTIVE SEED LOC MAMMO GUIDE 12/23/2023 [facility]   BREAST LUMPECTOMY WITH RADIOACTIVE SEED AND SENTINEL LYMPH NODE BIOPSY Right 12/24/2023   Procedure: RIGHT BREAST LUMPECTOMY WITH RADIOACTIVE SEED AND SENTINEL LYMPH NODE BIOPSY;  Surgeon: Abigail Miyamoto, MD;  Location: [facility] OR;  Service: General;  Laterality: Right;   DILATION AND CURETTAGE OF UTERUS     FOOT SURGERY Right    with hardware   trigger thumb Right    Patient Active Problem List   Diagnosis Date Noted   Community acquired pneumonia 01/06/2024   Genetic testing 12/22/2023   Invasive ductal carcinoma of breast, female, 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   Malignant neoplasm of upper-outer quadrant of right breast in female, estrogen receptor positive (HCC) 12/04/2023   Iron deficiency 07/02/2023    Mixed hyperlipidemia 07/02/2023   Perimenopause 03/12/2023   Tenosynovitis of right hand 01/29/2023   Osteoarthritis of carpometacarpal (CMC) joint of thumb 01/29/2023   Adhesive capsulitis of right shoulder 08/01/2022   Body mass index (BMI) of 26.0-26.9 in adult 04/02/2022   Anxiety 04/01/2022   Primary insomnia 04/01/2022   Abnormal cervical Papanicolaou smear 11/06/2021   Genital herpes simplex 03/13/2021   Vitamin D deficiency 10/25/2020   Vegetarian diet 10/23/2020   Encounter for annual physical exam 10/23/2020   TMJ tenderness, bilateral 02/11/2020   Seasonal allergies 02/11/2020   Family history of thyroid disease in mother 05/09/2017   Elevated TSH 05/09/2017   CMV (cytomegalovirus infection) (HCC)    Splenomegaly 04/16/2017   Asthma 11/12/2016    PCP: Dr. Enid Skeens   REFERRING PROVIDER: Dr Abigail Miyamoto   REFERRING DIAG: Right breast cancer, right shoulder pain and tightness  THERAPY DIAG:  Malignant neoplasm of upper-outer quadrant of right breast in female, estrogen receptor positive (HCC)  At risk for lymphedema  Aftercare following surgery for neoplasm  Chronic right shoulder pain  Stiffness of right shoulder, not elsewhere classified  Muscle weakness (generalized)  Rationale for Evaluation and Treatment: Rehabilitation  ONSET DATE: 11/28/23  SUBJECTIVE:  SUBJECTIVE STATEMENT: After surgery I got covid and then pneumonia.  In terms of the arm it is numb in the armpit.  I had my radiation simulation.  It was very uncomfortable and it made me very sore.  I start the 13th.   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 It measures 9 mm and is located in the upper outer quadrant. It is ER/PR positive  and HER2 negative with a Ki67 of 5%. Rt lumpectomy and SLNB on 12/24/23 with 3 negative nodes removed.  Will be having radiation. Right shoulder adhesive capsulitis diagnosed in mid 2023.   PATIENT GOALS:  Reassess how my recovery is going related to arm function, pain, and swelling.  PAIN:  Are you having pain? Yes: NPRS scale: 1/10 Pain location: Outer deltoid region now after the simulation  Pain description: achy  Aggravating factors: keeping it overhead  Relieving factors: avoiding overhead activities  PRECAUTIONS: Recent Surgery, right UE Lymphedema risk  RED FLAGS: None   ACTIVITY LEVEL / LEISURE: Back to normal activities except hard cleaning    OBJECTIVE:   PATIENT SURVEYS:  QUICK DASH: 25% from From 15.91% prior to surgery    OBSERVATIONS: Wearing compression, healing incisions with glue  POSTURE:  Rounded shoulders    LYMPHEDEMA ASSESSMENT:  UPPER EXTREMITY AROM/PROM:   A/PROM RIGHT   eval   01/19/24  Shoulder extension 40 40  Shoulder flexion 130 130  Shoulder abduction 118 and painful 109 pn  Shoulder internal rotation 30 and painful 25 pn  Shoulder external rotation 80 80                          (Blank rows = not tested)   A/PROM LEFT   eval  Shoulder extension 40  Shoulder flexion 152  Shoulder abduction 172  Shoulder internal rotation 58  Shoulder external rotation 90                          (Blank rows = not tested)   CERVICAL AROM: All within normal limits   UPPER EXTREMITY STRENGTH: Right shoulder - did not tolerate testing due to pain with any resistance; Left shoulder WNL   LANDMARK RIGHT   eval 01/19/24  10 cm proximal to olecranon process 28.5 29  Olecranon process 23.8 25  10  cm proximal to ulnar styloid process 22 21  Just proximal to ulnar styloid process 14.2 14.5  Across hand at thumb web space 16.2 16.5  At base of 2nd digit 6 6.2  (Blank rows = not tested)   LANDMARK LEFT   eval  10 cm proximal to olecranon process 28.2   Olecranon process 23.8  10 cm proximal to ulnar styloid process 21.2  Just proximal to ulnar styloid process 14.6  Across hand at thumb web space 16.8  At base of 2nd digit 5.9  (Blank rows = not tested)  Surgery type/Date: 12/24/23 Number of lymph nodes removed: 3 Current/past treatment (chemo, radiation, hormone therapy): starting radiation 01/29/24 Other symptoms:   TODAY"S TREATMENT Pt permission and consent throughout each step of examination and treatment with modification and draping if requested when working on sensitive areas  01/19/24 Re-eval performed Education on post op instructions per below Pt did not remember getting any exercises so we restarted a new HEP based on post op exercises per HEP section: Pt performed each exercise x  3 with cueing as needed Discussed silicone scar sheets, POC, as well   PATIENT EDUCATION:  Education details: per today's note Person educated: Patient Education method: Programmer, multimedia, Demonstration, Verbal cues, and Handouts Education comprehension: verbalized understanding and returned demonstration  HOME EXERCISE PROGRAM: Reviewed previously given post op HEP. Switched to supine chest stretch and dowel flexion   ASSESSMENT:  CLINICAL IMPRESSION: Pt returns around 4 weeks post lt lumpectomy and SLNB.  She has returned to baseline AROM but this baseline is limited due to her frozen shoulder.  She was being seen here previously and will restart visits as she had a lot of pain and discomfort in her radiation simulation position and will need to do this x 4 weeks.  She will also have more risk of being stiff after radiation due to a chronic frozen shoulder.  Will restart 2x per week x 4 weeks and start SOZO surveillance.   Pt will benefit from skilled therapeutic intervention to improve on the following deficits: Decreased knowledge of precautions, impaired UE functional use, pain, decreased ROM, postural dysfunction.   PT  treatment/interventions: ADL/Self care home management, [phone]- PT Re-evaluation, 97110-Therapeutic exercises, 97530- Therapeutic activity, [id number]- Neuromuscular re-education, 97535- Self Care, [id number]- Manual therapy, Dry Needling, and DME instructions   GOALS: Goals reviewed with patient? Yes  LONG TERM GOALS:  (STG=LTG)  GOALS Name Initial Status:  Goal status  1 Pt will demonstrate she has regained full shoulder ROM and function post operatively compared to baselines.  Baseline:  MET  2 Pt will improve baseline shoulder AROM by at least 10 degrees into flexion, abduction, and IR to demonstrate improved mobility  Flex; 130 Abd: 109 IR: 25 INITIAL  3 Pt will decrease QDASH to 15% to return to baseline status  25% INITIAL  4 Pt will be ind with final HEP  INITIAL     PLAN:  PT FREQUENCY/DURATION: 2x per week x 4 weeks and SOZO Q 3 months   PLAN FOR NEXT SESSION: Lt shoulder PROM/AAROM, MT for adhesive capsulitis.  Added DN to POC but would not do during radiation or at least 6 weeks post radiation or surgery.    [facility]  [address]  [place] [address]  [phone]  After Breast Cancer Class Video It is recommended you view the ABC class video to be educated on lymphedema risk reduction. This video lasts for about 30 minutes. It can be viewed on our website here: [email or web address]  Scar massage You can begin gentle scar massage to you incision sites. Gently place one hand on the incision and move the skin (without sliding on the skin) in various directions. Do this for a few minutes and then you can gently massage either coconut oil or vitamin E cream into the scars.  Compression garment You should continue wearing your compression bra until you feel like you no longer have swelling.  Home exercise Program Continue doing the exercises you were given until you feel like  you can do them without feeling any tightness at the end.   Walking Program Studies show that 30 minutes of walking per day (fast enough to elevate your heart rate) can significantly reduce the risk of a cancer recurrence. If you can't walk due to other medical reasons, we encourage you to find another activity you could do (like a stationary bike or water exercise).  Posture After breast cancer surgery, people frequently sit with rounded shoulders posture because it puts their incisions on slack  and feels better. If you sit like this and scar tissue forms in that position, you can become very tight and have pain sitting or standing with good posture. Try to be aware of your posture and sit and stand up tall to heal properly.  Follow up PT: It is recommended you return every 3 months for the first 3 years following surgery to be assessed on the SOZO machine for an L-Dex score. This helps prevent clinically significant lymphedema in 95% of patients. These follow up screens are 10 minute appointments that you are not billed for.  Idamae Lusher, PT 01/19/2024, 11:51 AM

## 2024-01-20 ENCOUNTER — Encounter: Payer: Self-pay | Admitting: *Deleted

## 2024-01-20 ENCOUNTER — Inpatient Hospital Stay: Payer: Managed Care, Other (non HMO) | Attending: Hematology and Oncology | Admitting: Hematology and Oncology

## 2024-01-20 VITALS — BP 118/79 | HR 75 | Temp 98.1°F | Resp 17 | Ht 61.0 in | Wt 162.3 lb

## 2024-01-20 DIAGNOSIS — Z170 Estrogen receptor positive status [ER+]: Secondary | ICD-10-CM | POA: Insufficient documentation

## 2024-01-20 DIAGNOSIS — Z808 Family history of malignant neoplasm of other organs or systems: Secondary | ICD-10-CM | POA: Diagnosis not present

## 2024-01-20 DIAGNOSIS — Z800 Family history of malignant neoplasm of digestive organs: Secondary | ICD-10-CM | POA: Diagnosis not present

## 2024-01-20 DIAGNOSIS — C50411 Malignant neoplasm of upper-outer quadrant of right female breast: Secondary | ICD-10-CM | POA: Diagnosis present

## 2024-01-20 DIAGNOSIS — Z79899 Other long term (current) drug therapy: Secondary | ICD-10-CM | POA: Insufficient documentation

## 2024-01-20 DIAGNOSIS — Z1721 Progesterone receptor positive status: Secondary | ICD-10-CM | POA: Insufficient documentation

## 2024-01-20 DIAGNOSIS — Z1732 Human epidermal growth factor receptor 2 negative status: Secondary | ICD-10-CM | POA: Insufficient documentation

## 2024-01-20 DIAGNOSIS — Z803 Family history of malignant neoplasm of breast: Secondary | ICD-10-CM | POA: Diagnosis not present

## 2024-01-20 NOTE — Assessment & Plan Note (Signed)
 Breast Cancer Post-surgical patient with 1.8 cm tumor removed, clear margins, and no lymph node involvement.  Oncotype score of 15, indicating low risk of recurrence and no need for chemotherapy. Premenopausal status limits hormonal therapy options. - Proceed with radiation therapy as planned. - Initiate Tamoxifen after completion of radiation therapy, with a plan for 5 years of treatment. - Monitor for side effects of Tamoxifen including hot flashes, vaginal discharge, and changes in menstruation, DVT/PE, endometrial hyperplasia and endometrial carcinoma.  Post-Surgical Healing Patient reports raised area and divide at surgical site. No signs of infection or significant complications at this time. - Monitor healing process, reassess at next appointment. - Advise patient to allow glue to naturally fall off.  General Health Maintenance / Followup Plans - Follow up appointment in third week of April, post-radiation therapy. - Discuss potential for Guardant Reveal testing at next appointment. - Reengage with plastic surgeon for potential corrective surgery 6 months post-radiation therapy.

## 2024-01-20 NOTE — Research (Unsigned)
 [id number]- Effectiveness of Out-of Pocket Cost Communication and Financial Navigation (CostCOM) in Cancer Patients:   Met with patient for 5 minutes to follow up on the above study and see if she has any questions. Informed patient she will not be eligible to enroll until we know when she will be starting Tamoxifen after radiation treatment.  Patient stated she does not want to participate in the study because she does not have the time to complete questionnaires and financial counseling calls. She does not feel like this study would be of benefit to herself.  Thanked patient for her time and encouraged her to contact research nurse if she changes her mind in the next few months. She verbalized understanding.  Dr. Al Pimple notified.  [name], BSN, RN, [employer] Clinical Research Nurse II [phone] 01/20/2024 12:22 PM

## 2024-01-20 NOTE — Progress Notes (Signed)
 [facility] CONSULT NOTE  Patient Care Team: Early, Sung Amabile, NP as PCP - General (Nurse Practitioner) Ranae Pila, MD as PCP - OBGYN (Obstetrics and Gynecology) Pershing Proud, RN as Oncology Nurse Navigator Donnelly Angelica, RN as Oncology Nurse Navigator Abigail Miyamoto, MD as Consulting Physician (General Surgery) Rachel Moulds, MD as Consulting Physician (Hematology and Oncology) Dorothy Puffer, MD as Consulting Physician (Radiation Oncology)  CHIEF COMPLAINTS/PURPOSE OF CONSULTATION:  Newly diagnosed breast cancer  HISTORY OF PRESENTING ILLNESS:  Ariel Ayala 48 y.o. female is here because of recent diagnosis of right breast cancer  I reviewed her records extensively and collaborated the history with the patient.  SUMMARY OF ONCOLOGIC HISTORY: Oncology History  Malignant neoplasm of upper-outer quadrant of right breast in female, estrogen receptor positive (HCC)  11/26/2023 Mammogram   Recalled from screening. Small mass within the right breast 12 o'clock position felt to correspond with the mammographically identified distortion. Targeted ultrasound is performed, showing a small irregular hypoechoic mass right breast 12 o'clock position 2 cm from the nipple measuring 6 x 9 x 5 mm.No right axillary adenopathy.       11/29/2023 Pathology Results   IDC, grade 1, The tumor cells are negative for Her2 (0).  Estrogen Receptor:  90%, POSITIVE, MODERATE STAINING INTENSITY  Progesterone Receptor:  100%, POSITIVE, STRONG STAINING INTENSITY  Proliferation Marker Ki67:  5%    12/04/2023 Initial Diagnosis   Malignant neoplasm of upper-outer quadrant of right breast in female, estrogen receptor positive (HCC)   12/08/2023 Cancer Staging   Staging form: Breast, AJCC 8th Edition - Clinical stage from 12/08/2023: Stage IA (cT1b, cN0, cM0, G1, ER+, PR+, HER2-) - Signed by Ronny Bacon, PA-C on 12/08/2023 Stage prefix: Initial diagnosis Method of lymph node  assessment: Clinical Histologic grading system: 3 grade system   12/19/2023 Genetic Testing   Negative [employer] CancerNext+RNAinsight Panel.  Report date is 12/19/2023.   The [employer] CancerNext+RNAinsight Panel includes sequencing, rearrangement analysis, and RNA analysis for the following 39 genes: APC, ATM, BAP1, BARD1, BMPR1A, BRCA1, BRCA2, BRIP1, CDH1, CDKN2A, CHEK2, FH, FLCN, MET, MLH1, MSH2, MSH6, MUTYH, NF1, NTHL1, PALB2, PMS2, PTEN, RAD51C, RAD51D, SMAD4, STK11, TP53, TSC1, TSC2, and VHL (sequencing and deletion/duplication); AXIN2, HOXB13, MBD4, MSH3, POLD1 and POLE (sequencing only); EPCAM and GREM1 (deletion/duplication only).     She had right breast lumpectomy on 12/24/2023.  Showed invasive ductal carcinoma 1.8 cm grade 1, negative margins.  All lymph nodes negative for carcinoma. Oncotype DX 15, no benefit from adjuvant chemotherapy.  Discussed the use of AI scribe software for clinical note transcription with the patient, who gave verbal consent to proceed.  History of Present Illness    Ariel Ayala is a 48 year old female with breast cancer who presents for follow-up after surgery.  She underwent surgery for breast cancer, during which a 1.8 cm tumor was excised with clear margins. Three lymph nodes were removed, none of which showed cancer involvement. Her oncotype score was 15, indicating no need for chemotherapy.  She is premenopausal, which limits her to tamoxifen as the treatment option. She expressed concerns about the physical changes post-surgery, including a raised area and persistent surgical glue. She also noted a new 'divide' in the right breast and wonders if this will improve. She has concerns about the size of the tamoxifen pill due to difficulty swallowing pills.  MEDICAL HISTORY:  Past Medical History:  Diagnosis Date   Asthma    as a child  Bloating 10/23/2020   CMV (cytomegalovirus infection) (HCC)    Early satiety 10/23/2020   Generalized abdominal  pain 10/23/2020   Hair loss 03/12/2023   Indigestion 10/23/2020   Invasive ductal carcinoma of breast, female, 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   Splenomegaly    on CT   Vitamin D deficiency 10/25/2020    SURGICAL HISTORY: Past Surgical History:  Procedure Laterality Date   BREAST BIOPSY Right 11/28/2023   MM RT BREAST BX W LOC DEV 1ST LESION IMAGE BX SPEC STEREO GUIDE 11/28/2023 [facility]   BREAST BIOPSY  12/23/2023   MM RT RADIOACTIVE SEED LOC MAMMO GUIDE 12/23/2023 [facility]   BREAST LUMPECTOMY WITH RADIOACTIVE SEED AND SENTINEL LYMPH NODE BIOPSY Right 12/24/2023   Procedure: RIGHT BREAST LUMPECTOMY WITH RADIOACTIVE SEED AND SENTINEL LYMPH NODE BIOPSY;  Surgeon: Abigail Miyamoto, MD;  Location: [facility] OR;  Service: General;  Laterality: Right;   DILATION AND CURETTAGE OF UTERUS     FOOT SURGERY Right    with hardware   trigger thumb Right     SOCIAL HISTORY: Social History   Socioeconomic History   Marital status: Single    Spouse name: Not on file   Number of children: Not on file   Years of education: Not on file   Highest education level: Not on file  Occupational History   Not on file  Tobacco Use   Smoking status: Never   Smokeless tobacco: Never  Vaping Use   Vaping status: Never Used  Substance and Sexual Activity   Alcohol use: Yes    Comment: 1-2/month   Drug use: No   Sexual activity: Not on file  Other Topics Concern   Not on file  Social History Narrative   Not on file   Social Drivers of Health   Financial Resource Strain: Not on file  Food Insecurity: No Food Insecurity (01/15/2024)   Hunger Vital Sign    Worried About Running Out of Food in the Last Year: Never true    Ran Out of Food in the Last Year: Never true  Transportation Needs: No Transportation Needs (01/15/2024)   PRAPARE - Transportation    Lack of Transportation (Medical): No    Lack of Transportation (Non-Medical): No  Physical Activity: Not on file  Stress: Not on file  Social  Connections: Not on file  Intimate Partner Violence: Not At Risk (01/15/2024)   Humiliation, Afraid, Rape, and Kick questionnaire    Fear of Current or Ex-Partner: No    Emotionally Abused: No    Physically Abused: No    Sexually Abused: No    FAMILY HISTORY: Family History  Problem Relation Age of Onset   Diabetes Mother    Thyroid disease Mother    Suicidality Father    Breast cancer Maternal Grandmother        dx 69s-70s   Colon cancer Cousin        dx 32s   Skin cancer Cousin        SCC    ALLERGIES:  is allergic to sulfa antibiotics and macrobid [nitrofurantoin].  MEDICATIONS:  Current Outpatient Medications  Medication Sig Dispense Refill   benzonatate (TESSALON) 200 MG capsule Take 1 capsule (200 mg total) by mouth 3 (three) times daily as needed for cough. (Patient not taking: Reported on 01/15/2024) 30 capsule 0   cetirizine (ZYRTEC) 10 MG tablet Take 10 mg by mouth daily as needed for allergies.     Cholecalciferol (VITAMIN D-3 PO) Take 1 capsule by  mouth daily.     Cyanocobalamin (VITAMIN B-12 PO) Take 1 tablet by mouth daily.     fluconazole (DIFLUCAN) 150 MG tablet Take one tablet at the first sign of yeast. May repeat in 3 days if symptoms persist. 2 tablet 0   gabapentin (NEURONTIN) 100 MG capsule Take 100 mg by mouth 3 (three) times daily.     ibuprofen (ADVIL) 200 MG tablet Take 400 mg by mouth 2 (two) times daily as needed for headache or moderate pain (pain score 4-6).     Melatonin 10 MG CAPS Take 10 mg by mouth at bedtime as needed (sleep).     Multiple Vitamin (MULTIVITAMIN) tablet Take 1 tablet by mouth daily.     naproxen sodium (ALEVE) 220 MG tablet Take 220 mg by mouth daily as needed (pain, headache).     rosuvastatin (CRESTOR) 5 MG tablet TAKE 1 TABLET (5 MG TOTAL) BY MOUTH DAILY. 90 tablet 1   sertraline (ZOLOFT) 25 MG tablet Take 25 mg by mouth daily.     traMADol (ULTRAM) 50 MG tablet Take 1 tablet (50 mg total) by mouth every 6 (six) hours as  needed for moderate pain (pain score 4-6) or severe pain (pain score 7-10). (Patient not taking: Reported on 01/06/2024) 25 tablet 0   No current facility-administered medications for this visit.    REVIEW OF SYSTEMS:   Constitutional: Denies fevers, chills or abnormal night sweats Eyes: Denies blurriness of vision, double vision or watery eyes Ears, nose, mouth, throat, and face: Denies mucositis or sore throat Respiratory: Denies cough, dyspnea or wheezes Cardiovascular: Denies palpitation, chest discomfort or lower extremity swelling Gastrointestinal:  Denies nausea, heartburn or change in bowel habits Skin: Denies abnormal skin rashes Lymphatics: Denies new lymphadenopathy or easy bruising Neurological:Denies numbness, tingling or new weaknesses Behavioral/Psych: Mood is stable, no new changes  Breast: Denies any palpable lumps or discharge All other systems were reviewed with the patient and are negative.  PHYSICAL EXAMINATION: ECOG PERFORMANCE STATUS: 0 - Asymptomatic  Vitals:   01/20/24 1151  BP: 118/79  Pulse: 75  Resp: 17  Temp: 98.1 F (36.7 C)  SpO2: 98%    Filed Weights   01/20/24 1151  Weight: 162 lb 5 oz (73.6 kg)     GENERAL:alert, no distress and comfortable Right breast healing well.   LABORATORY DATA:  I have reviewed the data as listed Lab Results  Component Value Date   WBC 10.2 12/10/2023   HGB 11.9 (L) 12/10/2023   HCT 37.1 12/10/2023   MCV 80.0 12/10/2023   PLT 441 (H) 12/10/2023   Lab Results  Component Value Date   NA 141 12/10/2023   K 3.2 (L) 12/10/2023   CL 105 12/10/2023   CO2 28 12/10/2023    RADIOGRAPHIC STUDIES: I have personally reviewed the radiological reports and agreed with the findings in the report.  ASSESSMENT AND PLAN:  Malignant neoplasm of upper-outer quadrant of right breast in female, estrogen receptor positive (HCC) Breast Cancer Post-surgical patient with 1.8 cm tumor removed, clear margins, and no lymph  node involvement.  Oncotype score of 15, indicating low risk of recurrence and no need for chemotherapy. Premenopausal status limits hormonal therapy options. - Proceed with radiation therapy as planned. - Initiate Tamoxifen after completion of radiation therapy, with a plan for 5 years of treatment. - Monitor for side effects of Tamoxifen including hot flashes, vaginal discharge, and changes in menstruation, DVT/PE, endometrial hyperplasia and endometrial carcinoma.  Post-Surgical Healing Patient reports raised  area and divide at surgical site. No signs of infection or significant complications at this time. - Monitor healing process, reassess at next appointment. - Advise patient to allow glue to naturally fall off.  General Health Maintenance / Followup Plans - Follow up appointment in third week of April, post-radiation therapy. - Discuss potential for Guardant Reveal testing at next appointment. - Reengage with plastic surgeon for potential corrective surgery 6 months post-radiation therapy.    All questions were answered. The patient knows to call the clinic with any problems, questions or concerns.    Rachel Moulds, MD 01/20/24

## 2024-01-21 ENCOUNTER — Encounter: Payer: Self-pay | Admitting: Rehabilitative and Restorative Service Providers"

## 2024-01-21 ENCOUNTER — Ambulatory Visit: Admitting: Rehabilitative and Restorative Service Providers"

## 2024-01-21 DIAGNOSIS — M25611 Stiffness of right shoulder, not elsewhere classified: Secondary | ICD-10-CM

## 2024-01-21 DIAGNOSIS — M6281 Muscle weakness (generalized): Secondary | ICD-10-CM

## 2024-01-21 DIAGNOSIS — G8929 Other chronic pain: Secondary | ICD-10-CM

## 2024-01-21 DIAGNOSIS — C50411 Malignant neoplasm of upper-outer quadrant of right female breast: Secondary | ICD-10-CM

## 2024-01-21 DIAGNOSIS — Z9189 Other specified personal risk factors, not elsewhere classified: Secondary | ICD-10-CM

## 2024-01-21 DIAGNOSIS — Z483 Aftercare following surgery for neoplasm: Secondary | ICD-10-CM

## 2024-01-21 NOTE — Therapy (Signed)
 OUTPATIENT PHYSICAL THERAPY BREAST CANCER POST OP FOLLOW UP   Patient Name: Ariel Ayala MRN: [id number] DOB:23-Nov-1975, 48 y.o., female Today's Date: 01/21/2024  END OF SESSION:  PT End of Session - 01/21/24 1150     Visit Number 6    Date for PT Re-Evaluation 02/16/24    Authorization Type [employer]    Authorization - Visit Number 6    Authorization - Number of Visits 20    PT Start Time 1146    PT Stop Time 1225    PT Time Calculation (min) 39 min    Activity Tolerance Patient tolerated treatment well    Behavior During Therapy WFL for tasks assessed/performed             Past Medical History:  Diagnosis Date   Asthma    as a child   Bloating 10/23/2020   CMV (cytomegalovirus infection) (HCC)    Early satiety 10/23/2020   Generalized abdominal pain 10/23/2020   Hair loss 03/12/2023   Indigestion 10/23/2020   Invasive ductal carcinoma of breast, female, 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   Splenomegaly    on CT   Vitamin D deficiency 10/25/2020   Past Surgical History:  Procedure Laterality Date   BREAST BIOPSY Right 11/28/2023   MM RT BREAST BX W LOC DEV 1ST LESION IMAGE BX SPEC STEREO GUIDE 11/28/2023 [facility]   BREAST BIOPSY  12/23/2023   MM RT RADIOACTIVE SEED LOC MAMMO GUIDE 12/23/2023 [facility]   BREAST LUMPECTOMY WITH RADIOACTIVE SEED AND SENTINEL LYMPH NODE BIOPSY Right 12/24/2023   Procedure: RIGHT BREAST LUMPECTOMY WITH RADIOACTIVE SEED AND SENTINEL LYMPH NODE BIOPSY;  Surgeon: Abigail Miyamoto, MD;  Location: [facility] OR;  Service: General;  Laterality: Right;   DILATION AND CURETTAGE OF UTERUS     FOOT SURGERY Right    with hardware   trigger thumb Right    Patient Active Problem List   Diagnosis Date Noted   Community acquired pneumonia 01/06/2024   Genetic testing 12/22/2023   Invasive ductal carcinoma of breast, female, 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   Malignant neoplasm of upper-outer quadrant of right breast in female, estrogen receptor positive (HCC)  12/04/2023   Iron deficiency 07/02/2023   Mixed hyperlipidemia 07/02/2023   Perimenopause 03/12/2023   Tenosynovitis of right hand 01/29/2023   Osteoarthritis of carpometacarpal (CMC) joint of thumb 01/29/2023   Adhesive capsulitis of right shoulder 08/01/2022   Body mass index (BMI) of 26.0-26.9 in adult 04/02/2022   Anxiety 04/01/2022   Primary insomnia 04/01/2022   Abnormal cervical Papanicolaou smear 11/06/2021   Genital herpes simplex 03/13/2021   Vitamin D deficiency 10/25/2020   Vegetarian diet 10/23/2020   Encounter for annual physical exam 10/23/2020   TMJ tenderness, bilateral 02/11/2020   Seasonal allergies 02/11/2020   Family history of thyroid disease in mother 05/09/2017   Elevated TSH 05/09/2017   CMV (cytomegalovirus infection) (HCC)    Splenomegaly 04/16/2017   Asthma 11/12/2016    PCP: Dr. Enid Skeens   REFERRING PROVIDER: Dr Abigail Miyamoto   REFERRING DIAG: Right breast cancer, right shoulder pain and tightness  THERAPY DIAG:  Malignant neoplasm of upper-outer quadrant of right breast in female, estrogen receptor positive (HCC)  At risk for lymphedema  Aftercare following surgery for neoplasm  Chronic right shoulder pain  Stiffness of right shoulder, not elsewhere classified  Muscle weakness (generalized)  Rationale for Evaluation and Treatment: Rehabilitation  ONSET DATE: 11/28/23  SUBJECTIVE:  SUBJECTIVE STATEMENT: Patient reports that she is still recovering from her illness, but is feeling better.  Patient start Radiation on 01/29/24.  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 It measures 9 mm and is located in the upper outer quadrant. It is ER/PR positive and HER2 negative with a Ki67 of  5%. Rt lumpectomy and SLNB on 12/24/23 with 3 negative nodes removed.  Will be having radiation. Right shoulder adhesive capsulitis diagnosed in mid 2023.   PATIENT GOALS:  Reassess how my recovery is going related to arm function, pain, and swelling.  PAIN:  Are you having pain? Yes: NPRS scale: 1-2/10 Pain location: Outer deltoid region now after the simulation  Pain description: achy  Aggravating factors: keeping it overhead  Relieving factors: avoiding overhead activities  PRECAUTIONS: Recent Surgery, right UE Lymphedema risk  RED FLAGS: None   ACTIVITY LEVEL / LEISURE: Back to normal activities except hard cleaning    OBJECTIVE:   PATIENT SURVEYS:  12/10/2023:  Nino Parsley Dash 15.91 12/18/2023:  Quick Dash 34.09 01/19/2024:  QUICK DASH:  25%   OBSERVATIONS: Wearing compression, healing incisions with glue  POSTURE:  Rounded shoulders    LYMPHEDEMA ASSESSMENT:  UPPER EXTREMITY AROM/PROM:   A/PROM RIGHT   eval   Right A/ROM (in standing) 12/18/23 01/19/24  Shoulder extension 40 42 40  Shoulder flexion 130 100 A/ROM 134 AA/ROM 130  Shoulder abduction 118 and painful 110 with pain 109 pn  Shoulder internal rotation 30 and painful  25 pn  Shoulder external rotation 80  80                          (Blank rows = not tested)   A/PROM LEFT   eval  Shoulder extension 40  Shoulder flexion 152  Shoulder abduction 172  Shoulder internal rotation 58  Shoulder external rotation 90                          (Blank rows = not tested)   CERVICAL AROM: All within normal limits   UPPER EXTREMITY STRENGTH: Right shoulder - did not tolerate testing due to pain with any resistance; Left shoulder WNL   LANDMARK RIGHT   eval 01/19/24  10 cm proximal to olecranon process 28.5 29  Olecranon process 23.8 25  10  cm proximal to ulnar styloid process 22 21  Just proximal to ulnar styloid process 14.2 14.5  Across hand at thumb web space 16.2 16.5  At base of 2nd digit 6 6.2  (Blank rows =  not tested)   LANDMARK LEFT   eval  10 cm proximal to olecranon process 28.2  Olecranon process 23.8  10 cm proximal to ulnar styloid process 21.2  Just proximal to ulnar styloid process 14.6  Across hand at thumb web space 16.8  At base of 2nd digit 5.9  (Blank rows = not tested)  Surgery type/Date: 12/24/23 Number of lymph nodes removed: 3 Current/past treatment (chemo, radiation, hormone therapy): starting radiation 01/29/24 Other symptoms:   TODAY"S TREATMENT Pt permission and consent throughout each step of examination and treatment with modification and draping if requested when working on sensitive areas  DATE: 01/21/2024 Shoulder pulleys for flexion and abduction x2 min each Seated shoulder flexion with blue pball 2x10 Seated shoulder abduction with blue pball 2x10 Seated Active Assisted shoulder flexion with grasped hands x10 Seated chest stretch with  hands behind head 5x5 sec hold Standing UE Ranger for flexion (in cancer gym) 2x10 Standing scaption at finger ladder x10 Standing scapular retraction 2x10 Supine shoulder flexion with dowel rod 2x10 Supine shoulder abduction with dowel rod 2x10 Supine chest press with 1# weight on dowel rod 2x10   DATE: 01/19/24 Re-eval performed Education on post op instructions per below Pt did not remember getting any exercises so we restarted a new HEP based on post op exercises per HEP section: Pt performed each exercise x 3 with cueing as needed Discussed silicone scar sheets, POC, as well   DATE:  12/18/2023 Shoulder pulleys for flexion and abduction x2 min each Seated shoulder flexion with blue pball 2x10 Seated shoulder abduction with blue pball 2x10 Shoulder A/ROM in standing Quick Dash and education about upcoming surgical procedure Seated scapular retraction 2x10 Seated chest stretch with hands behind head 5x5 sec hold Standing UE Ranger for flexion (in cancer gym) 2x10 Standing scaption at finger ladder x10 Prone:   shoulder flexion, horizontal abduction, extension, rows.  RUE x10 Seated Active Assisted shoulder flexion with grasped hands x10 Seated right upper trap stretch 2x20 sec  PATIENT EDUCATION:  Education details: per today's note Person educated: Patient Education method: Programmer, multimedia, Facilities manager, Verbal cues, and Handouts Education comprehension: verbalized understanding and returned demonstration  HOME EXERCISE PROGRAM: Access Code: [email or web address] URL: [email or web address] Date: 01/21/2024 Prepared by: Reather Laurence  Exercises - Seated Scapular Retraction  - 2 x daily - 7 x weekly - 2 sets - 10 reps - Seated AAROM Shoulder Flexion  - 2 x daily - 7 x weekly - 2 sets - 10 reps - Standing Shoulder Abduction Finger Walk at Wall  - 2 x daily - 7 x weekly - 2 sets - 10 reps - Seated Chest Stretch with Hands Behind Head  - 2 x daily - 7 x weekly - 3 reps - 5 second hold - Seated Flexion Stretch with Swiss Ball  - 1 x daily - 7 x weekly - 2 sets - 10 reps - Standing Shoulder Abduction AAROM with Swiss Ball on Table  - 1 x daily - 7 x weekly - 3 sets - 10 reps - Supine Chest Stretch with Elbows Bent  - 2 x daily - 7 x weekly - 3 reps - 5 sec hold - Supine Shoulder Flexion Extension AAROM with Dowel  - 1 x daily - 7 x weekly - 1-2 sets - 10 reps  ASSESSMENT:  CLINICAL IMPRESSION: Ms Kempner presents to skilled PT reporting that she has been performing her HEP.  Patient states that overall, she is having less pain, until she tries to move her right shoulder.  Patient continues to remain limited by shoulder A/ROM, but this was a pre-existing condition of her adhesive capsulitis.  Patient able to progress with exercises that she was performing prior to her lumpectomy.  Patient starts radiation treatment next week.  Patient continues to require skilled PT to progress towards goal related activities.  Pt will benefit from skilled therapeutic intervention to improve on the following  deficits: Decreased knowledge of precautions, impaired UE functional use, pain, decreased ROM, postural dysfunction.   PT treatment/interventions: ADL/Self care home management, [phone]- PT Re-evaluation, 97110-Therapeutic exercises, 97530- Therapeutic activity, [id number]- Neuromuscular re-education, 97535- Self Care, [id number]- Manual therapy, Dry Needling, and DME instructions   GOALS: Goals reviewed with patient? Yes  LONG TERM GOALS:  (STG=LTG)  GOALS Name Initial Status:  Goal status  1 Pt will demonstrate she has regained full  shoulder ROM and function post operatively compared to baselines.  Baseline:  MET  2 Pt will improve baseline shoulder AROM by at least 10 degrees into flexion, abduction, and IR to demonstrate improved mobility  Flex; 130 Abd: 109 IR: 25 Ongoing  3 Pt will decrease QDASH to 15% to return to baseline status  25% INITIAL  4 Pt will be ind with final HEP  INITIAL     PLAN:  PT FREQUENCY/DURATION: 2x per week x 4 weeks and SOZO Q 3 months   PLAN FOR NEXT SESSION: Lt shoulder PROM/AAROM, MT for adhesive capsulitis.  Added DN to POC but would not do during radiation or at least 6 weeks post radiation or surgery.     Reather Laurence, PT, DPT 01/21/24, 12:40 PM  [facility] [address], [address] [place], [place] [address] Phone # [phone] Fax [phone]

## 2024-01-26 ENCOUNTER — Ambulatory Visit

## 2024-01-26 DIAGNOSIS — Z483 Aftercare following surgery for neoplasm: Secondary | ICD-10-CM

## 2024-01-26 DIAGNOSIS — C50411 Malignant neoplasm of upper-outer quadrant of right female breast: Secondary | ICD-10-CM | POA: Diagnosis not present

## 2024-01-26 DIAGNOSIS — G8929 Other chronic pain: Secondary | ICD-10-CM

## 2024-01-26 DIAGNOSIS — M25611 Stiffness of right shoulder, not elsewhere classified: Secondary | ICD-10-CM

## 2024-01-26 DIAGNOSIS — Z9189 Other specified personal risk factors, not elsewhere classified: Secondary | ICD-10-CM

## 2024-01-26 NOTE — Therapy (Signed)
 OUTPATIENT PHYSICAL THERAPY BREAST CANCER POST OP FOLLOW UP   Patient Name: Ariel Ayala MRN: [id number] DOB:[birth year], 48 y.o., female Today's Date: 01/26/2024  END OF SESSION:  PT End of Session - 01/26/24 1211     Visit Number 7    Number of Visits 9    Date for PT Re-Evaluation 02/16/24    Authorization Type [employer]    Authorization - Number of Visits 20    PT Start Time 1208    PT Stop Time 1259    PT Time Calculation (min) 51 min    Activity Tolerance Patient tolerated treatment well    Behavior During Therapy [facility] for tasks assessed/performed             Past Medical History:  Diagnosis Date   Asthma    as a child   Bloating 10/23/2020   CMV (cytomegalovirus infection) (HCC)    Early satiety 10/23/2020   Generalized abdominal pain 10/23/2020   Hair loss 03/12/2023   Indigestion 10/23/2020   Invasive ductal carcinoma of breast, female, 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   Splenomegaly    on CT   Vitamin D deficiency 10/25/2020   Past Surgical History:  Procedure Laterality Date   BREAST BIOPSY Right 11/28/2023   MM RT BREAST BX W LOC DEV 1ST LESION IMAGE BX SPEC STEREO GUIDE 11/28/2023 [facility]   BREAST BIOPSY  12/23/2023   MM RT RADIOACTIVE SEED LOC MAMMO GUIDE 12/23/2023 [facility]   BREAST LUMPECTOMY WITH RADIOACTIVE SEED AND SENTINEL LYMPH NODE BIOPSY Right 12/24/2023   Procedure: RIGHT BREAST LUMPECTOMY WITH RADIOACTIVE SEED AND SENTINEL LYMPH NODE BIOPSY;  Surgeon: Abigail Miyamoto, MD;  Location: [facility] OR;  Service: General;  Laterality: Right;   DILATION AND CURETTAGE OF UTERUS     FOOT SURGERY Right    with hardware   trigger thumb Right    Patient Active Problem List   Diagnosis Date Noted   Community acquired pneumonia 01/06/2024   Genetic testing 12/22/2023   Invasive ductal carcinoma of breast, female, 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   Malignant neoplasm of upper-outer quadrant of right breast in female, estrogen receptor positive (HCC) 12/04/2023    Iron deficiency 07/02/2023   Mixed hyperlipidemia 07/02/2023   Perimenopause 03/12/2023   Tenosynovitis of right hand 01/29/2023   Osteoarthritis of carpometacarpal (CMC) joint of thumb 01/29/2023   Adhesive capsulitis of right shoulder 08/01/2022   Body mass index (BMI) of 26.0-26.9 in adult 04/02/2022   Anxiety 04/01/2022   Primary insomnia 04/01/2022   Abnormal cervical Papanicolaou smear 11/06/2021   Genital herpes simplex 03/13/2021   Vitamin D deficiency 10/25/2020   Vegetarian diet 10/23/2020   Encounter for annual physical exam 10/23/2020   TMJ tenderness, bilateral 02/11/2020   Seasonal allergies 02/11/2020   Family history of thyroid disease in mother 05/09/2017   Elevated TSH 05/09/2017   CMV (cytomegalovirus infection) (HCC)    Splenomegaly 04/16/2017   Asthma 11/12/2016    PCP: Dr. Enid Skeens   REFERRING PROVIDER: Dr Abigail Miyamoto   REFERRING DIAG: Right breast cancer, right shoulder pain and tightness  THERAPY DIAG:  Malignant neoplasm of upper-outer quadrant of right breast in female, estrogen receptor positive (HCC)  At risk for lymphedema  Aftercare following surgery for neoplasm  Chronic right shoulder pain  Stiffness of right shoulder, not elsewhere classified  Rationale for Evaluation and Treatment: Rehabilitation  ONSET DATE: 11/28/23  SUBJECTIVE:  SUBJECTIVE STATEMENT: Patient reports that she has a lot of soreness at the right mid delt especially after her radiation silulation. Patient starts Radiation on 01/29/24   PERTINENT HISTORY:  Patient was diagnosed on 11/28/2023 with right grade 1 invasive ductal carcinoma breast cancer. It measures 9 mm and is located in the upper outer quadrant. It is ER/PR positive and HER2 negative with a Ki67 of 5%. Rt  lumpectomy and SLNB on 12/24/23 with 3 negative nodes removed.  Will be having radiation. Right shoulder adhesive capsulitis diagnosed in mid 2023.   PATIENT GOALS:  Reassess how my recovery is going related to arm function, pain, and swelling.  PAIN:  Are you having pain? Yes: NPRS scale: 1-2/10 Pain location: Outer deltoid region now after the simulation  Pain description: achy  Aggravating factors: keeping it overhead  Relieving factors: avoiding overhead activities  PRECAUTIONS: Recent Surgery, right UE Lymphedema risk  RED FLAGS: None   ACTIVITY LEVEL / LEISURE: Back to normal activities except hard cleaning    OBJECTIVE:   PATIENT SURVEYS:  12/10/2023:  Nino Parsley Dash 15.91 12/18/2023:  Quick Dash 34.09 01/19/2024:  QUICK DASH:  25%   OBSERVATIONS: Wearing compression, healing incisions with glue  POSTURE:  Rounded shoulders    LYMPHEDEMA ASSESSMENT:  UPPER EXTREMITY AROM/PROM:   A/PROM RIGHT   eval   Right A/ROM (in standing) 12/18/23 01/19/24  Shoulder extension 40 42 40  Shoulder flexion 130 100 A/ROM 134 AA/ROM 130  Shoulder abduction 118 and painful 110 with pain 109 pn  Shoulder internal rotation 30 and painful  25 pn  Shoulder external rotation 80  80                          (Blank rows = not tested)   A/PROM LEFT   eval  Shoulder extension 40  Shoulder flexion 152  Shoulder abduction 172  Shoulder internal rotation 58  Shoulder external rotation 90                          (Blank rows = not tested)   CERVICAL AROM: All within normal limits   UPPER EXTREMITY STRENGTH: Right shoulder - did not tolerate testing due to pain with any resistance; Left shoulder WNL   LANDMARK RIGHT   eval 01/19/24  10 cm proximal to olecranon process 28.5 29  Olecranon process 23.8 25  10  cm proximal to ulnar styloid process 22 21  Just proximal to ulnar styloid process 14.2 14.5  Across hand at thumb web space 16.2 16.5  At base of 2nd digit 6 6.2  (Blank rows = not  tested)   LANDMARK LEFT   eval  10 cm proximal to olecranon process 28.2  Olecranon process 23.8  10 cm proximal to ulnar styloid process 21.2  Just proximal to ulnar styloid process 14.6  Across hand at thumb web space 16.8  At base of 2nd digit 5.9  (Blank rows = not tested)  Surgery type/Date: 12/24/23 Number of lymph nodes removed: 3 Current/past treatment (chemo, radiation, hormone therapy): starting radiation 01/29/24 Other symptoms:   TODAY"S TREATMENT Pt permission and consent throughout each step of examination and treatment with modification and draping if requested when working on sensitive areas  DATE:   01/26/2024 Pulleys flexion and abd x 2 min Seated shoulder flexion and abd 2 x 10 Finger ladder x 10 scaption Shoulder Ranger flexion 2 x 10, scaption x 5  Stargazer x 5, 5 sec Supine wand flexion x 10, flexion with 1# wt x 4 Supine wand scaption x 7 GH mobs gr 2/3 post and Inferior PROM Right shoulder flexion, scaption, abd to restore fxl mobility STM with cocoa butter to right mid deltoid  01/21/2024 Shoulder pulleys for flexion and abduction x2 min each, VC to depress scapula Seated shoulder flexion with blue pball 2x10 Seated shoulder abduction with blue pball 2x10 Seated Active Assisted shoulder flexion with grasped hands x10 Seated chest stretch with hands behind head 5x5 sec hold Standing UE Ranger for flexion (in cancer gym) 2x10 Standing scaption at finger ladder x10 Standing scapular retraction 2x10 Supine shoulder flexion with dowel rod 2x10 Supine shoulder abduction with dowel rod 2x10 Supine chest press with 1# weight on dowel rod 2x10   DATE: 01/19/24 Re-eval performed Education on post op instructions per below Pt did not remember getting any exercises so we restarted a new HEP based on post op exercises per HEP section: Pt performed each exercise x 3 with cueing as needed Discussed silicone scar sheets, POC, as well   DATE:   12/18/2023 Shoulder pulleys for flexion and abduction x2 min each Seated shoulder flexion with blue pball 2x10 Seated shoulder abduction with blue pball 2x10 Shoulder A/ROM in standing Quick Dash and education about upcoming surgical procedure Seated scapular retraction 2x10 Seated chest stretch with hands behind head 5x5 sec hold Standing UE Ranger for flexion (in cancer gym) 2x10 Standing scaption at finger ladder x10 Prone:  shoulder flexion, horizontal abduction, extension, rows.  RUE x10 Seated Active Assisted shoulder flexion with grasped hands x10 Seated right upper trap stretch 2x20 sec  PATIENT EDUCATION:  Education details: per today's note Person educated: Patient Education method: Programmer, multimedia, Facilities manager, Verbal cues, and Handouts Education comprehension: verbalized understanding and returned demonstration  HOME EXERCISE PROGRAM: Access Code: [email or web address] URL: [email or web address] Date: 01/21/2024 Prepared by: Reather Laurence  Exercises - Seated Scapular Retraction  - 2 x daily - 7 x weekly - 2 sets - 10 reps - Seated AAROM Shoulder Flexion  - 2 x daily - 7 x weekly - 2 sets - 10 reps - Standing Shoulder Abduction Finger Walk at Wall  - 2 x daily - 7 x weekly - 2 sets - 10 reps - Seated Chest Stretch with Hands Behind Head  - 2 x daily - 7 x weekly - 3 reps - 5 second hold - Seated Flexion Stretch with Swiss Ball  - 1 x daily - 7 x weekly - 2 sets - 10 reps - Standing Shoulder Abduction AAROM with Swiss Ball on Table  - 1 x daily - 7 x weekly - 3 sets - 10 reps - Supine Chest Stretch with Elbows Bent  - 2 x daily - 7 x weekly - 3 reps - 5 sec hold - Supine Shoulder Flexion Extension AAROM with Dowel  - 1 x daily - 7 x weekly - 1-2 sets - 10 reps  ASSESSMENT:  CLINICAL IMPRESSION: Very stiff in right shoulder when first initiating pulleys and noting mid deltoid pain. Reviewed importance of scapular depression with overhead activities. Transitioned several  exs to supine to improve end ROM. Pt with good tolerance for ROM activities and demonstrated good improvement in mobility.  Pt will benefit from skilled therapeutic intervention to improve on the following deficits: Decreased knowledge of precautions, impaired UE functional use, pain, decreased ROM, postural dysfunction.   PT treatment/interventions: ADL/Self care home management, [phone]- PT Re-evaluation, 97110-Therapeutic exercises, 97530- Therapeutic  activity, [id number]- Neuromuscular re-education, 97535- Self Care, 82956- Manual therapy, Dry Needling, and DME instructions   GOALS: Goals reviewed with patient? Yes  LONG TERM GOALS:  (STG=LTG)  GOALS Name Initial Status:  Goal status  1 Pt will demonstrate she has regained full shoulder ROM and function post operatively compared to baselines.  Baseline:  MET  2 Pt will improve baseline shoulder AROM by at least 10 degrees into flexion, abduction, and IR to demonstrate improved mobility  Flex; 130 Abd: 109 IR: 25 Ongoing  3 Pt will decrease QDASH to 15% to return to baseline status  25% INITIAL  4 Pt will be ind with final HEP  INITIAL     PLAN:  PT FREQUENCY/DURATION: 2x per week x 4 weeks and SOZO Q 3 months   PLAN FOR NEXT SESSION: Lt shoulder PROM/AAROM, MT for adhesive capsulitis.  Added DN to POC but would not do during radiation or at least 6 weeks post radiation or surgery.  Theraband for scapular strength     Alvira Monday, PT 01/26/24 1:14 PM  [facility] [address], [address] [place], [place] [address] Phone # [phone] Fax [phone]

## 2024-01-27 ENCOUNTER — Ambulatory Visit
Admission: RE | Admit: 2024-01-27 | Discharge: 2024-01-27 | Disposition: A | Discharge status: Home / Self Care | Payer: Managed Care, Other (non HMO) | Source: Ambulatory Visit | Attending: Radiation Oncology | Admitting: Radiation Oncology

## 2024-01-27 ENCOUNTER — Inpatient Hospital Stay: Payer: Managed Care, Other (non HMO) | Admitting: Licensed Clinical Social Worker

## 2024-01-27 DIAGNOSIS — C50911 Malignant neoplasm of unspecified site of right female breast: Secondary | ICD-10-CM | POA: Insufficient documentation

## 2024-01-27 NOTE — Progress Notes (Signed)
 [facility] CSW Counseling Note  Patient was referred by self. Treatment type: Individual  Presenting Concerns: Patient and/or family reports the following symptoms/concerns: stress Duration of problem: 2 months; Severity of problem: moderate   Orientation:oriented to person, place, time/date, and situation.   Affect: Appropriate and Congruent Risk of harm to self or others: No plan to harm self or others  Patient and/or Family's Strengths/Protective Factors: Social connections, Social and Emotional competence, and Concrete supports in place (healthy food, safe environments, etc.)Ability for insight  Capable of independent living  Communication skills  Motivation for treatment/growth  Supportive family/friends      Goals Addressed: Patient will:  Reduce symptoms of: stress Increase healthy adjustment to current life circumstances   Progress towards Goals: Progressing   Interventions: Interventions utilized:  Strength-based and Supportive      Assessment: Patient is making progress towards healthy adjustment to cancer and managing stress. She has improving energy levels after being ill and begins radiation therapy this week.  Pt acknowledges nerves about first visit, but in a manageable way.  Pt shared about return to work, dealing with response when people ask how she is, and with setting boundaries on what she shares about her body.  Pt wants to engage in social settings, but is worried about committing to something that she may need to cancel if she is feeling poorly from radiation.  Discussed balancing mental and physical well-being and aiming for outings that she can leave early if needed without guilt.  Pt is also planning different events to look forward to when she finishes next month (picnic with social group; spring break for daughter).  Briefly discussed bell ringing ceremony and ways it can be done. CSW shared contacts for FMLA team at [employer] if pt needs during radiation.     Plan: Follow up with CSW: 2 weeks Behavioral recommendations: Continue doing activities that help you relax and find joy (ex: watching soccer) to help destress.  Continue setting boundaries with others- only share what you want to about your health and body. Redirect conversations if needed Referral(s): added to [facility] interest list for fall       Camila Norville E Mckinzy Fuller, LCSW

## 2024-01-28 ENCOUNTER — Encounter: Payer: Self-pay | Admitting: Rehabilitative and Restorative Service Providers"

## 2024-01-28 ENCOUNTER — Encounter: Payer: Self-pay | Admitting: Nurse Practitioner

## 2024-01-28 ENCOUNTER — Ambulatory Visit: Admitting: Rehabilitative and Restorative Service Providers"

## 2024-01-28 DIAGNOSIS — Z9189 Other specified personal risk factors, not elsewhere classified: Secondary | ICD-10-CM

## 2024-01-28 DIAGNOSIS — Z483 Aftercare following surgery for neoplasm: Secondary | ICD-10-CM

## 2024-01-28 DIAGNOSIS — M25611 Stiffness of right shoulder, not elsewhere classified: Secondary | ICD-10-CM

## 2024-01-28 DIAGNOSIS — M6281 Muscle weakness (generalized): Secondary | ICD-10-CM

## 2024-01-28 DIAGNOSIS — C50411 Malignant neoplasm of upper-outer quadrant of right female breast: Secondary | ICD-10-CM | POA: Diagnosis not present

## 2024-01-28 DIAGNOSIS — G8929 Other chronic pain: Secondary | ICD-10-CM

## 2024-01-28 DIAGNOSIS — Z170 Estrogen receptor positive status [ER+]: Secondary | ICD-10-CM

## 2024-01-28 NOTE — Therapy (Signed)
 OUTPATIENT PHYSICAL THERAPY BREAST CANCER POST OP TREATMENT NOTE   Patient Name: Ariel Ayala MRN: [id number] DOB:10/01/1976, 48 y.o., female Today's Date: 01/28/2024  END OF SESSION:  PT End of Session - 01/28/24 1234     Visit Number 8    Date for PT Re-Evaluation 02/16/24    Authorization Type [employer]    Authorization - Visit Number 7    Authorization - Number of Visits 20    PT Start Time 1232    PT Stop Time 1313    PT Time Calculation (min) 41 min    Activity Tolerance Patient tolerated treatment well    Behavior During Therapy [facility] for tasks assessed/performed             Past Medical History:  Diagnosis Date   Asthma    as a child   Bloating 10/23/2020   CMV (cytomegalovirus infection) (HCC)    Early satiety 10/23/2020   Generalized abdominal pain 10/23/2020   Hair loss 03/12/2023   Indigestion 10/23/2020   Invasive ductal carcinoma of breast, female, 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   Splenomegaly    on CT   Vitamin D deficiency 10/25/2020   Past Surgical History:  Procedure Laterality Date   BREAST BIOPSY Right 11/28/2023   MM RT BREAST BX W LOC DEV 1ST LESION IMAGE BX SPEC STEREO GUIDE 11/28/2023 [facility]   BREAST BIOPSY  12/23/2023   MM RT RADIOACTIVE SEED LOC MAMMO GUIDE 12/23/2023 [facility]   BREAST LUMPECTOMY WITH RADIOACTIVE SEED AND SENTINEL LYMPH NODE BIOPSY Right 12/24/2023   Procedure: RIGHT BREAST LUMPECTOMY WITH RADIOACTIVE SEED AND SENTINEL LYMPH NODE BIOPSY;  Surgeon: Abigail Miyamoto, MD;  Location: [facility] OR;  Service: General;  Laterality: Right;   DILATION AND CURETTAGE OF UTERUS     FOOT SURGERY Right    with hardware   trigger thumb Right    Patient Active Problem List   Diagnosis Date Noted   Community acquired pneumonia 01/06/2024   Genetic testing 12/22/2023   Invasive ductal carcinoma of breast, female, 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   Malignant neoplasm of upper-outer quadrant of right breast in female, estrogen receptor positive  (HCC) 12/04/2023   Iron deficiency 07/02/2023   Mixed hyperlipidemia 07/02/2023   Perimenopause 03/12/2023   Tenosynovitis of right hand 01/29/2023   Osteoarthritis of carpometacarpal (CMC) joint of thumb 01/29/2023   Adhesive capsulitis of right shoulder 08/01/2022   Body mass index (BMI) of 26.0-26.9 in adult 04/02/2022   Anxiety 04/01/2022   Primary insomnia 04/01/2022   Abnormal cervical Papanicolaou smear 11/06/2021   Genital herpes simplex 03/13/2021   Vitamin D deficiency 10/25/2020   Vegetarian diet 10/23/2020   Encounter for annual physical exam 10/23/2020   TMJ tenderness, bilateral 02/11/2020   Seasonal allergies 02/11/2020   Family history of thyroid disease in mother 05/09/2017   Elevated TSH 05/09/2017   CMV (cytomegalovirus infection) (HCC)    Splenomegaly 04/16/2017   Asthma 11/12/2016    PCP: Dr. Enid Skeens   REFERRING PROVIDER: Dr Abigail Miyamoto   REFERRING DIAG: Right breast cancer, right shoulder pain and tightness  THERAPY DIAG:  Malignant neoplasm of upper-outer quadrant of right breast in female, estrogen receptor positive (HCC)  At risk for lymphedema  Aftercare following surgery for neoplasm  Chronic right shoulder pain  Stiffness of right shoulder, not elsewhere classified  Muscle weakness (generalized)  Rationale for Evaluation and Treatment: Rehabilitation  ONSET DATE: 11/28/23  SUBJECTIVE:  SUBJECTIVE STATEMENT: Patient reports that she was a little sore after her last session with PT.  States that her breast tissue was feeling some pulling.  Patient starts Radiation on 01/29/24   PERTINENT HISTORY:  Patient was diagnosed on 11/28/2023 with right grade 1 invasive ductal carcinoma breast cancer. It measures 9 mm and is located in the upper outer quadrant.  It is ER/PR positive and HER2 negative with a Ki67 of 5%. Rt lumpectomy and SLNB on 12/24/23 with 3 negative nodes removed.  Will be having radiation. Right shoulder adhesive capsulitis diagnosed in mid 2023.   PATIENT GOALS:  Reassess how my recovery is going related to arm function, pain, and swelling.  PAIN:  Are you having pain? Yes: NPRS scale: 2/10 Pain location: Outer deltoid region now after the simulation  Pain description: achy  Aggravating factors: keeping it overhead  Relieving factors: avoiding overhead activities  PRECAUTIONS: Recent Surgery, right UE Lymphedema risk  RED FLAGS: None   ACTIVITY LEVEL / LEISURE: Back to normal activities except hard cleaning    OBJECTIVE:   PATIENT SURVEYS:  12/10/2023:  Nino Parsley Dash 15.91 12/18/2023:  Quick Dash 34.09 01/19/2024:  QUICK DASH:  25%   OBSERVATIONS: Wearing compression, healing incisions with glue  POSTURE:  Rounded shoulders    LYMPHEDEMA ASSESSMENT:  UPPER EXTREMITY AROM/PROM:   A/PROM RIGHT   eval   Right A/ROM (in standing) 12/18/23 01/19/24 Right A/ROM (in standing) 01/28/24  Shoulder extension 40 42 40   Shoulder flexion 130 100 A/ROM 134 AA/ROM 130 106 112 after dry neeling  Shoulder abduction 118 and painful 110 with pain 109 pn 108 115 after dry needling  Shoulder internal rotation 30 and painful  25 pn   Shoulder external rotation 80  80                           (Blank rows = not tested)   A/PROM LEFT   eval  Shoulder extension 40  Shoulder flexion 152  Shoulder abduction 172  Shoulder internal rotation 58  Shoulder external rotation 90                          (Blank rows = not tested)   CERVICAL AROM: All within normal limits   UPPER EXTREMITY STRENGTH: Right shoulder - did not tolerate testing due to pain with any resistance; Left shoulder WNL   LANDMARK RIGHT   eval 01/19/24  10 cm proximal to olecranon process 28.5 29  Olecranon process 23.8 25  10  cm proximal to ulnar styloid process  22 21  Just proximal to ulnar styloid process 14.2 14.5  Across hand at thumb web space 16.2 16.5  At base of 2nd digit 6 6.2  (Blank rows = not tested)   LANDMARK LEFT   eval  10 cm proximal to olecranon process 28.2  Olecranon process 23.8  10 cm proximal to ulnar styloid process 21.2  Just proximal to ulnar styloid process 14.6  Across hand at thumb web space 16.8  At base of 2nd digit 5.9  (Blank rows = not tested)  Surgery type/Date: 12/24/23 Number of lymph nodes removed: 3 Current/past treatment (chemo, radiation, hormone therapy): starting radiation 01/29/24 Other symptoms:   TODAY"S TREATMENT Pt permission and consent throughout each step of examination and treatment with modification and draping if requested when working on sensitive areas  DATE: 01/28/2024 Shoulder pulleys for flexion and abduction x2  min each Seated shoulder flexion with blue pball 2x10 Seated shoulder abduction with blue pball 2x10 Seated Active Assisted shoulder flexion with grasped hands x10 Seated chest stretch with hands behind head 5x5 sec hold Standing UE Ranger for flexion (in cancer gym) 2x10 Standing flexion at finger ladder x10 Standing scaption at finger ladder x10 Trigger Point Dry Needling Subsequent Treatment: Instructions provided previously at initial dry needling treatment.  Patient Verbal Consent Given: Yes Education Handout Provided: Previously Provided Muscles Treated: right upper trap and right delts Electrical Stimulation Performed: No Treatment Response/Outcome: Utilized skilled palpation to identify bony landmarks and trigger points.  Able to illicit twitch response and muscle elongation.  Soft tissue mobilization with cocoa butter following to further promote tissue elongation.      DATE: 01/26/2024 Pulleys flexion and abd x 2 min Seated shoulder flexion and abd 2 x 10 Finger ladder x 10 scaption Shoulder Ranger flexion 2 x 10, scaption x 5 Stargazer x 5, 5 sec Supine  wand flexion x 10, flexion with 1# wt x 4 Supine wand scaption x 7 GH mobs gr 2/3 post and Inferior PROM Right shoulder flexion, scaption, abd to restore fxl mobility STM with cocoa butter to right mid deltoid  DATE: 01/21/2024 Shoulder pulleys for flexion and abduction x2 min each, VC to depress scapula Seated shoulder flexion with blue pball 2x10 Seated shoulder abduction with blue pball 2x10 Seated Active Assisted shoulder flexion with grasped hands x10 Seated chest stretch with hands behind head 5x5 sec hold Standing UE Ranger for flexion (in cancer gym) 2x10 Standing scaption at finger ladder x10 Standing scapular retraction 2x10 Supine shoulder flexion with dowel rod 2x10 Supine shoulder abduction with dowel rod 2x10 Supine chest press with 1# weight on dowel rod 2x10    PATIENT EDUCATION:  Education details: per today's note Person educated: Patient Education method: Programmer, multimedia, Facilities manager, Verbal cues, and Handouts Education comprehension: verbalized understanding and returned demonstration  HOME EXERCISE PROGRAM: Access Code: [email or web address] URL: [email or web address] Date: 01/21/2024 Prepared by: Reather Laurence  Exercises - Seated Scapular Retraction  - 2 x daily - 7 x weekly - 2 sets - 10 reps - Seated AAROM Shoulder Flexion  - 2 x daily - 7 x weekly - 2 sets - 10 reps - Standing Shoulder Abduction Finger Walk at Wall  - 2 x daily - 7 x weekly - 2 sets - 10 reps - Seated Chest Stretch with Hands Behind Head  - 2 x daily - 7 x weekly - 3 reps - 5 second hold - Seated Flexion Stretch with Swiss Ball  - 1 x daily - 7 x weekly - 2 sets - 10 reps - Standing Shoulder Abduction AAROM with Swiss Ball on Table  - 1 x daily - 7 x weekly - 3 sets - 10 reps - Supine Chest Stretch with Elbows Bent  - 2 x daily - 7 x weekly - 3 reps - 5 sec hold - Supine Shoulder Flexion Extension AAROM with Dowel  - 1 x daily - 7 x weekly - 1-2 sets - 10 reps  ASSESSMENT:  CLINICAL  IMPRESSION: Ariel Ayala presents to skilled PT reporting that she starts her radiation treatment tomorrow.  Patient wanting to do one more session of dry needling prior to starting radiation treatment.  Patient with good response to strengthening and range of motion exercises.  Patient with good twitch response noted during dry needling.  Patient with improved tissue elasticity with soft tissue mobilization following dry needling with patient  reporting decreased pain and feeling "looser."  Patient with improved A/ROM noted, as well, following dry needling.  Patient continues to require skilled PT to progress towards goal related activities.  Pt will benefit from skilled therapeutic intervention to improve on the following deficits: Decreased knowledge of precautions, impaired UE functional use, pain, decreased ROM, postural dysfunction.   PT treatment/interventions: ADL/Self care home management, [phone]- PT Re-evaluation, 97110-Therapeutic exercises, 97530- Therapeutic activity, [id number]- Neuromuscular re-education, 97535- Self Care, [id number]- Manual therapy, Dry Needling, and DME instructions   GOALS: Goals reviewed with patient? Yes  LONG TERM GOALS:  (STG=LTG)  GOALS Name Initial Status:  Goal status  1 Pt will demonstrate she has regained full shoulder ROM and function post operatively compared to baselines.  Baseline:  MET  2 Pt will improve baseline shoulder AROM by at least 10 degrees into flexion, abduction, and IR to demonstrate improved mobility  Flex; 130 Abd: 109 IR: 25 Ongoing  3 Pt will decrease QDASH to 15% to return to baseline status  25% INITIAL  4 Pt will be ind with final HEP  INITIAL     PLAN:  PT FREQUENCY/DURATION: 2x per week x 4 weeks and SOZO Q 3 months   PLAN FOR NEXT SESSION: Lt shoulder PROM/AAROM, MT for adhesive capsulitis.  Added DN to POC but would not do during radiation or at least 6 weeks post radiation or surgery.  Theraband for scapular  strength   Reather Laurence, PT, DPT 01/28/24, 1:25 PM   [facility] [address] [place], [place] [address] Phone # [phone] Fax [phone]

## 2024-01-29 ENCOUNTER — Other Ambulatory Visit: Payer: Self-pay

## 2024-01-29 DIAGNOSIS — C50911 Malignant neoplasm of unspecified site of right female breast: Secondary | ICD-10-CM | POA: Diagnosis not present

## 2024-01-29 LAB — RAD ONC ARIA SESSION SUMMARY
Course Elapsed Days: 0
Plan Fractions Treated to Date: 1
Plan Prescribed Dose Per Fraction: 2.66 Gy
Plan Total Fractions Prescribed: 16
Plan Total Prescribed Dose: 42.56 Gy
Reference Point Dosage Given to Date: 2.66 Gy
Reference Point Session Dosage Given: 2.66 Gy
Session Number: 1

## 2024-01-30 ENCOUNTER — Other Ambulatory Visit: Payer: Self-pay

## 2024-01-30 ENCOUNTER — Ambulatory Visit
Admission: RE | Admit: 2024-01-30 | Discharge: 2024-01-30 | Disposition: A | Discharge status: Home / Self Care | Source: Ambulatory Visit | Attending: Radiation Oncology | Admitting: Radiation Oncology

## 2024-01-30 ENCOUNTER — Ambulatory Visit
Admission: RE | Admit: 2024-01-30 | Discharge: 2024-01-30 | Disposition: A | Discharge status: Home / Self Care | Payer: Managed Care, Other (non HMO) | Source: Ambulatory Visit | Attending: Radiation Oncology | Admitting: Radiation Oncology

## 2024-01-30 DIAGNOSIS — C50911 Malignant neoplasm of unspecified site of right female breast: Secondary | ICD-10-CM | POA: Diagnosis not present

## 2024-01-30 LAB — RAD ONC ARIA SESSION SUMMARY
Course Elapsed Days: 1
Plan Fractions Treated to Date: 2
Plan Prescribed Dose Per Fraction: 2.66 Gy
Plan Total Fractions Prescribed: 16
Plan Total Prescribed Dose: 42.56 Gy
Reference Point Dosage Given to Date: 5.32 Gy
Reference Point Session Dosage Given: 2.66 Gy
Session Number: 2

## 2024-01-30 MED ORDER — RADIAPLEXRX EX GEL: Freq: Once | CUTANEOUS | Status: AC

## 2024-01-30 MED ORDER — ALRA NON-METALLIC DEODORANT (RAD-ONC)
1.0000 | Freq: Once | TOPICAL | Status: AC
  Administered 2024-01-30: 1 via TOPICAL

## 2024-02-02 ENCOUNTER — Ambulatory Visit
Admission: RE | Admit: 2024-02-02 | Discharge: 2024-02-02 | Disposition: A | Discharge status: Home / Self Care | Payer: Managed Care, Other (non HMO) | Source: Ambulatory Visit | Attending: Radiation Oncology

## 2024-02-02 ENCOUNTER — Ambulatory Visit

## 2024-02-02 ENCOUNTER — Other Ambulatory Visit: Payer: Self-pay

## 2024-02-02 DIAGNOSIS — Z9189 Other specified personal risk factors, not elsewhere classified: Secondary | ICD-10-CM

## 2024-02-02 DIAGNOSIS — M25611 Stiffness of right shoulder, not elsewhere classified: Secondary | ICD-10-CM

## 2024-02-02 DIAGNOSIS — C50411 Malignant neoplasm of upper-outer quadrant of right female breast: Secondary | ICD-10-CM | POA: Diagnosis not present

## 2024-02-02 DIAGNOSIS — G8929 Other chronic pain: Secondary | ICD-10-CM

## 2024-02-02 DIAGNOSIS — Z483 Aftercare following surgery for neoplasm: Secondary | ICD-10-CM

## 2024-02-02 DIAGNOSIS — C50911 Malignant neoplasm of unspecified site of right female breast: Secondary | ICD-10-CM | POA: Diagnosis not present

## 2024-02-02 LAB — RAD ONC ARIA SESSION SUMMARY
Course Elapsed Days: 4
Plan Fractions Treated to Date: 3
Plan Prescribed Dose Per Fraction: 2.66 Gy
Plan Total Fractions Prescribed: 16
Plan Total Prescribed Dose: 42.56 Gy
Reference Point Dosage Given to Date: 7.98 Gy
Reference Point Session Dosage Given: 2.66 Gy
Session Number: 3

## 2024-02-02 NOTE — Therapy (Signed)
 OUTPATIENT PHYSICAL THERAPY BREAST CANCER POST OP TREATMENT NOTE   Patient Name: Ariel Ayala MRN: [id number] DOB:[birth year], 48 y.o., female Today's Date: 02/02/2024  END OF SESSION:  PT End of Session - 02/02/24 1208     Visit Number 9    Number of Visits 17    Date for PT Re-Evaluation 03/01/24    Authorization Type [employer]    Authorization - Visit Number 8    Authorization - Number of Visits 20    PT Start Time 1208    PT Stop Time 1300    PT Time Calculation (min) 52 min    Activity Tolerance Patient tolerated treatment well    Behavior During Therapy [facility] for tasks assessed/performed             Past Medical History:  Diagnosis Date   Asthma    as a child   Bloating 10/23/2020   CMV (cytomegalovirus infection) (HCC)    Early satiety 10/23/2020   Generalized abdominal pain 10/23/2020   Hair loss 03/12/2023   Indigestion 10/23/2020   Invasive ductal carcinoma of breast, female, 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   Splenomegaly    on CT   Vitamin D deficiency 10/25/2020   Past Surgical History:  Procedure Laterality Date   BREAST BIOPSY Right 11/28/2023   MM RT BREAST BX W LOC DEV 1ST LESION IMAGE BX SPEC STEREO GUIDE 11/28/2023 [facility]   BREAST BIOPSY  12/23/2023   MM RT RADIOACTIVE SEED LOC MAMMO GUIDE 12/23/2023 [facility]   BREAST LUMPECTOMY WITH RADIOACTIVE SEED AND SENTINEL LYMPH NODE BIOPSY Right 12/24/2023   Procedure: RIGHT BREAST LUMPECTOMY WITH RADIOACTIVE SEED AND SENTINEL LYMPH NODE BIOPSY;  Surgeon: Abigail Miyamoto, MD;  Location: [facility] OR;  Service: General;  Laterality: Right;   DILATION AND CURETTAGE OF UTERUS     FOOT SURGERY Right    with hardware   trigger thumb Right    Patient Active Problem List   Diagnosis Date Noted   Community acquired pneumonia 01/06/2024   Genetic testing 12/22/2023   Invasive ductal carcinoma of breast, female, 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   Malignant neoplasm of upper-outer quadrant of right breast in female,  estrogen receptor positive (HCC) 12/04/2023   Iron deficiency 07/02/2023   Mixed hyperlipidemia 07/02/2023   Perimenopause 03/12/2023   Tenosynovitis of right hand 01/29/2023   Osteoarthritis of carpometacarpal (CMC) joint of thumb 01/29/2023   Adhesive capsulitis of right shoulder 08/01/2022   Body mass index (BMI) of 26.0-26.9 in adult 04/02/2022   Anxiety 04/01/2022   Primary insomnia 04/01/2022   Abnormal cervical Papanicolaou smear 11/06/2021   Genital herpes simplex 03/13/2021   Vitamin D deficiency 10/25/2020   Vegetarian diet 10/23/2020   Encounter for annual physical exam 10/23/2020   TMJ tenderness, bilateral 02/11/2020   Seasonal allergies 02/11/2020   Family history of thyroid disease in mother 05/09/2017   Elevated TSH 05/09/2017   CMV (cytomegalovirus infection) (HCC)    Splenomegaly 04/16/2017   Asthma 11/12/2016    PCP: Dr. Enid Skeens   REFERRING PROVIDER: Dr Abigail Miyamoto   REFERRING DIAG: Right breast cancer, right shoulder pain and tightness  THERAPY DIAG:  Malignant neoplasm of upper-outer quadrant of right breast in female, estrogen receptor positive (HCC)  At risk for lymphedema  Aftercare following surgery for neoplasm  Chronic right shoulder pain  Stiffness of right shoulder, not elsewhere classified  Rationale for Evaluation and Treatment: Rehabilitation  ONSET DATE: 11/28/23  SUBJECTIVE:  SUBJECTIVE STATEMENT: I am moving more and feel like motion is better. It feels better to stretch in the morning because I couldn't before it was too painful. I don't have much pain at rest. Pain is about 25-30% better  PERTINENT HISTORY:  Patient was diagnosed on 11/28/2023 with right grade 1 invasive ductal carcinoma breast cancer. It measures 9 mm and is located in the  upper outer quadrant. It is ER/PR positive and HER2 negative with a Ki67 of 5%. Rt lumpectomy and SLNB on 12/24/23 with 3 negative nodes removed.  Will be having radiation. Right shoulder adhesive capsulitis diagnosed in mid 2023.   PATIENT GOALS:  Reassess how my recovery is going related to arm function, pain, and swelling.  PAIN:  Are you having pain? Yes: NPRS scale: 1-2/10 Pain location: Outer deltoid region now after the simulation  Pain description: achy  Aggravating factors: keeping it overhead  Relieving factors: avoiding overhead activities  PRECAUTIONS: Recent Surgery, right UE Lymphedema risk  RED FLAGS: None   ACTIVITY LEVEL / LEISURE: Back to normal activities except hard cleaning    OBJECTIVE:   PATIENT SURVEYS:  12/10/2023:  Nino Parsley Dash 15.91 12/18/2023:  Quick Dash 34.09 01/19/2024:  QUICK DASH:  25%  02/02/2023  Quick Dash 22.73  OBSERVATIONS: Wearing compression, healing incisions with glue  POSTURE:  Rounded shoulders    LYMPHEDEMA ASSESSMENT:  UPPER EXTREMITY AROM/PROM:   A/PROM RIGHT   eval   Right A/ROM (in standing) 12/18/23 01/19/24 Right A/ROM (in standing) 01/28/24  Shoulder extension 40 42 40   Shoulder flexion 130 100 A/ROM 134 AA/ROM 130 106 112 after dry neeling  Shoulder abduction 118 and painful 110 with pain 109 pn 108 115 after dry needling  Shoulder internal rotation 30 and painful  25 pn   Shoulder external rotation 80  80                           (Blank rows = not tested)   A/PROM LEFT   eval  Shoulder extension 40  Shoulder flexion 152  Shoulder abduction 172  Shoulder internal rotation 58  Shoulder external rotation 90                          (Blank rows = not tested)   CERVICAL AROM: All within normal limits   UPPER EXTREMITY STRENGTH: Right shoulder - did not tolerate testing due to pain with any resistance; Left shoulder WNL   LANDMARK RIGHT   eval 01/19/24  10 cm proximal to olecranon process 28.5 29  Olecranon  process 23.8 25  10  cm proximal to ulnar styloid process 22 21  Just proximal to ulnar styloid process 14.2 14.5  Across hand at thumb web space 16.2 16.5  At base of 2nd digit 6 6.2  (Blank rows = not tested)   LANDMARK LEFT   eval  10 cm proximal to olecranon process 28.2  Olecranon process 23.8  10 cm proximal to ulnar styloid process 21.2  Just proximal to ulnar styloid process 14.6  Across hand at thumb web space 16.8  At base of 2nd digit 5.9  (Blank rows = not tested)  Surgery type/Date: 12/24/23 Number of lymph nodes removed: 3 Current/past treatment (chemo, radiation, hormone therapy): starting radiation 01/29/24 Other symptoms:   TODAY"S TREATMENT Pt permission and consent throughout each step of examination and treatment with modification and draping if requested when working on  sensitive areas  DATE:  02/02/2024 Discussed progress with pain/goals Finger ladder x 10 Standing UE Ranger for flexion (in cancer gym) 2x10 Pulleys x 2 min flex and abd Yellow band scapular retraction, shoulder extension, ER x 10 Supine wand flexion, scaption x5  GH mobs right shoulder Gr 3 post and inferior, PROM right shoulder flexion, scaption, abduction, IR and ER.  Updated HEP with postural TB and gave yellow band for home use Also showed IR behind back with towel and added to HEP  01/28/2024 Shoulder pulleys for flexion and abduction x2 min each Seated shoulder flexion with blue pball 2x10 Seated shoulder abduction with blue pball 2x10 Seated Active Assisted shoulder flexion with grasped hands x10 Seated chest stretch with hands behind head 5x5 sec hold Standing UE Ranger for flexion (in cancer gym) 2x10 Standing flexion at finger ladder x10 Standing scaption at finger ladder x10 Trigger Point Dry Needling Subsequent Treatment: Instructions provided previously at initial dry needling treatment.  Patient Verbal Consent Given: Yes Education Handout Provided: Previously  Provided Muscles Treated: right upper trap and right delts Electrical Stimulation Performed: No Treatment Response/Outcome: Utilized skilled palpation to identify bony landmarks and trigger points.  Able to illicit twitch response and muscle elongation.  Soft tissue mobilization with cocoa butter following to further promote tissue elongation.      DATE: 01/26/2024 Pulleys flexion and abd x 2 min Seated shoulder flexion and abd 2 x 10 Finger ladder x 10 scaption Shoulder Ranger flexion 2 x 10, scaption x 5 Stargazer x 5, 5 sec Supine wand flexion x 10, flexion with 1# wt x 4 Supine wand scaption x 7 GH mobs gr 2/3 post and Inferior PROM Right shoulder flexion, scaption, abd to restore fxl mobility STM with cocoa butter to right mid deltoid  DATE: 01/21/2024 Shoulder pulleys for flexion and abduction x2 min each, VC to depress scapula Seated shoulder flexion with blue pball 2x10 Seated shoulder abduction with blue pball 2x10 Seated Active Assisted shoulder flexion with grasped hands x10 Seated chest stretch with hands behind head 5x5 sec hold Standing UE Ranger for flexion (in cancer gym) 2x10 Standing scaption at finger ladder x10 Standing scapular retraction 2x10 Supine shoulder flexion with dowel rod 2x10 Supine shoulder abduction with dowel rod 2x10 Supine chest press with 1# weight on dowel rod 2x10    PATIENT EDUCATION:  Education details: per today's note Person educated: Patient Education method: Programmer, multimedia, Facilities manager, Verbal cues, and Handouts Education comprehension: verbalized understanding and returned demonstration  HOME EXERCISE PROGRAM: Postural TB and gave yellow band for home use Also showed IR behind back with towel  Alvira Monday 02/02/2024 see above Access Code: [email or web address] URL: [email or web address] Date: 01/21/2024 Prepared by: Reather Laurence  Exercises - Seated Scapular Retraction  - 2 x daily - 7 x weekly - 2 sets - 10 reps - Seated  AAROM Shoulder Flexion  - 2 x daily - 7 x weekly - 2 sets - 10 reps - Standing Shoulder Abduction Finger Walk at Wall  - 2 x daily - 7 x weekly - 2 sets - 10 reps - Seated Chest Stretch with Hands Behind Head  - 2 x daily - 7 x weekly - 3 reps - 5 second hold - Seated Flexion Stretch with Swiss Ball  - 1 x daily - 7 x weekly - 2 sets - 10 reps - Standing Shoulder Abduction AAROM with Swiss Ball on Table  - 1 x daily - 7 x weekly - 3 sets - 10 reps -  Supine Chest Stretch with Elbows Bent  - 2 x daily - 7 x weekly - 3 reps - 5 sec hold - Supine Shoulder Flexion Extension AAROM with Dowel  - 1 x daily - 7 x weekly - 1-2 sets - 10 reps  ASSESSMENT:  CLINICAL IMPRESSION: Pt reports her pain is improved by 25-30 percent overall. She finds it easier to stretch first thing in the morning which she didn't use to do because it hurt to much. She notes good improvement from DN and gets good pain relief from it. PROM is most limited with IR and ER due to pain. She is progressing toward goals, but is still limited with functional activities as noted by Quick dash. She will benefit from skilled PT to address remaining deficits and return to PLOF.   Pt will benefit from skilled therapeutic intervention to improve on the following deficits: Decreased knowledge of precautions, impaired UE functional use, pain, decreased ROM, postural dysfunction.   PT treatment/interventions: ADL/Self care home management, [phone]- PT Re-evaluation, 97110-Therapeutic exercises, 97530- Therapeutic activity, [id number]- Neuromuscular re-education, 97535- Self Care, [id number]- Manual therapy, Dry Needling, and DME instructions   GOALS: Goals reviewed with patient? Yes  LONG TERM GOALS:  (STG=LTG)  GOALS Name Initial Status:  Goal status  1 Pt will demonstrate she has regained full shoulder ROM and function post operatively compared to baselines.  Baseline:  MET  2 Pt will improve baseline shoulder AROM by at least 10 degrees into  flexion, abduction, and IR to demonstrate improved mobility  Flex; 130 Abd: 109 IR: 25 Ongoing  3 Pt will decrease QDASH to 15% to return to baseline status  22% In progress  4 Pt will be ind with final HEP  In Progress  5 Pt will be able to fasten bra behind her back  NEW     PLAN:  PT FREQUENCY/DURATION: 1-2x per week x 4 weeks and SOZO Q 3 months   PLAN FOR NEXT SESSION: Lt shoulder PROM/AAROM, MT for adhesive capsulitis.  Added DN to POC but would not do during radiation or at least 6 weeks post radiation or surgery.  Theraband for scapular strength   Alvira Monday PT 02/02/24, 1:07 PM   [facility] [address] [place], [place] [address] Phone # [phone] Fax [phone]

## 2024-02-03 ENCOUNTER — Other Ambulatory Visit: Payer: Self-pay

## 2024-02-03 ENCOUNTER — Ambulatory Visit
Admission: RE | Admit: 2024-02-03 | Discharge: 2024-02-03 | Disposition: A | Discharge status: Home / Self Care | Payer: Managed Care, Other (non HMO) | Source: Ambulatory Visit | Attending: Radiation Oncology

## 2024-02-03 DIAGNOSIS — C50911 Malignant neoplasm of unspecified site of right female breast: Secondary | ICD-10-CM | POA: Diagnosis not present

## 2024-02-03 LAB — RAD ONC ARIA SESSION SUMMARY
Course Elapsed Days: 5
Plan Fractions Treated to Date: 4
Plan Prescribed Dose Per Fraction: 2.66 Gy
Plan Total Fractions Prescribed: 16
Plan Total Prescribed Dose: 42.56 Gy
Reference Point Dosage Given to Date: 10.64 Gy
Reference Point Session Dosage Given: 2.66 Gy
Session Number: 4

## 2024-02-04 ENCOUNTER — Other Ambulatory Visit: Payer: Self-pay

## 2024-02-04 ENCOUNTER — Ambulatory Visit
Admission: RE | Admit: 2024-02-04 | Discharge: 2024-02-04 | Disposition: A | Discharge status: Home / Self Care | Payer: Managed Care, Other (non HMO) | Source: Ambulatory Visit | Attending: Radiation Oncology

## 2024-02-04 DIAGNOSIS — C50911 Malignant neoplasm of unspecified site of right female breast: Secondary | ICD-10-CM | POA: Diagnosis not present

## 2024-02-04 LAB — RAD ONC ARIA SESSION SUMMARY
Course Elapsed Days: 6
Plan Fractions Treated to Date: 5
Plan Prescribed Dose Per Fraction: 2.66 Gy
Plan Total Fractions Prescribed: 16
Plan Total Prescribed Dose: 42.56 Gy
Reference Point Dosage Given to Date: 13.3 Gy
Reference Point Session Dosage Given: 2.66 Gy
Session Number: 5

## 2024-02-05 ENCOUNTER — Other Ambulatory Visit: Payer: Self-pay

## 2024-02-05 ENCOUNTER — Ambulatory Visit
Admission: RE | Admit: 2024-02-05 | Discharge: 2024-02-05 | Disposition: A | Discharge status: Home / Self Care | Payer: Managed Care, Other (non HMO) | Source: Ambulatory Visit | Attending: Radiation Oncology

## 2024-02-05 ENCOUNTER — Ambulatory Visit: Admitting: Rehabilitative and Restorative Service Providers"

## 2024-02-05 DIAGNOSIS — C50911 Malignant neoplasm of unspecified site of right female breast: Secondary | ICD-10-CM | POA: Diagnosis not present

## 2024-02-05 LAB — RAD ONC ARIA SESSION SUMMARY
Course Elapsed Days: 7
Plan Fractions Treated to Date: 6
Plan Prescribed Dose Per Fraction: 2.66 Gy
Plan Total Fractions Prescribed: 16
Plan Total Prescribed Dose: 42.56 Gy
Reference Point Dosage Given to Date: 15.96 Gy
Reference Point Session Dosage Given: 2.66 Gy
Session Number: 6

## 2024-02-06 ENCOUNTER — Ambulatory Visit
Admission: RE | Admit: 2024-02-06 | Discharge: 2024-02-06 | Disposition: A | Discharge status: Home / Self Care | Payer: Managed Care, Other (non HMO) | Source: Ambulatory Visit | Attending: Radiation Oncology | Admitting: Radiation Oncology

## 2024-02-06 ENCOUNTER — Other Ambulatory Visit: Payer: Self-pay

## 2024-02-06 ENCOUNTER — Ambulatory Visit
Admission: RE | Admit: 2024-02-06 | Discharge: 2024-02-06 | Disposition: A | Discharge status: Home / Self Care | Source: Ambulatory Visit | Attending: Radiation Oncology | Admitting: Radiation Oncology

## 2024-02-06 DIAGNOSIS — C50911 Malignant neoplasm of unspecified site of right female breast: Secondary | ICD-10-CM | POA: Diagnosis not present

## 2024-02-06 LAB — RAD ONC ARIA SESSION SUMMARY
Course Elapsed Days: 8
Plan Fractions Treated to Date: 7
Plan Prescribed Dose Per Fraction: 2.66 Gy
Plan Total Fractions Prescribed: 16
Plan Total Prescribed Dose: 42.56 Gy
Reference Point Dosage Given to Date: 18.62 Gy
Reference Point Session Dosage Given: 2.66 Gy
Session Number: 7

## 2024-02-09 ENCOUNTER — Ambulatory Visit
Admission: RE | Admit: 2024-02-09 | Discharge: 2024-02-09 | Disposition: A | Discharge status: Home / Self Care | Payer: Managed Care, Other (non HMO) | Source: Ambulatory Visit | Attending: Radiation Oncology | Admitting: Radiation Oncology

## 2024-02-09 ENCOUNTER — Other Ambulatory Visit: Payer: Self-pay

## 2024-02-09 ENCOUNTER — Ambulatory Visit: Admitting: Rehabilitation

## 2024-02-09 DIAGNOSIS — C50911 Malignant neoplasm of unspecified site of right female breast: Secondary | ICD-10-CM | POA: Diagnosis not present

## 2024-02-09 LAB — RAD ONC ARIA SESSION SUMMARY
Course Elapsed Days: 11
Plan Fractions Treated to Date: 8
Plan Prescribed Dose Per Fraction: 2.66 Gy
Plan Total Fractions Prescribed: 16
Plan Total Prescribed Dose: 42.56 Gy
Reference Point Dosage Given to Date: 21.28 Gy
Reference Point Session Dosage Given: 2.66 Gy
Session Number: 8

## 2024-02-10 ENCOUNTER — Inpatient Hospital Stay: Admitting: Licensed Clinical Social Worker

## 2024-02-10 ENCOUNTER — Ambulatory Visit
Admission: RE | Admit: 2024-02-10 | Discharge: 2024-02-10 | Disposition: A | Discharge status: Home / Self Care | Payer: Managed Care, Other (non HMO) | Source: Ambulatory Visit | Attending: Radiation Oncology | Admitting: Radiation Oncology

## 2024-02-10 ENCOUNTER — Other Ambulatory Visit: Payer: Self-pay

## 2024-02-10 DIAGNOSIS — C50911 Malignant neoplasm of unspecified site of right female breast: Secondary | ICD-10-CM | POA: Diagnosis not present

## 2024-02-10 LAB — RAD ONC ARIA SESSION SUMMARY
Course Elapsed Days: 12
Plan Fractions Treated to Date: 9
Plan Prescribed Dose Per Fraction: 2.66 Gy
Plan Total Fractions Prescribed: 16
Plan Total Prescribed Dose: 42.56 Gy
Reference Point Dosage Given to Date: 23.94 Gy
Reference Point Session Dosage Given: 2.66 Gy
Session Number: 9

## 2024-02-10 NOTE — Progress Notes (Signed)
 [facility] CSW Counseling Note  Patient was referred by self. Treatment type: Individual  Presenting Concerns: Patient and/or family reports the following symptoms/concerns: stress Duration of problem: 2 months; Severity of problem: moderate   Orientation:oriented to person, place, time/date, and situation.   Affect: Appropriate and Congruent Risk of harm to self or others: No plan to harm self or others  Patient and/or Family's Strengths/Protective Factors: Social connections, Social and Emotional competence, and Concrete supports in place (healthy food, safe environments, etc.)Ability for insight  Capable of independent living  Communication skills  Motivation for treatment/growth  Supportive family/friends      Goals Addressed: Patient will:  Reduce symptoms of: stress Increase healthy adjustment to current life circumstances   Progress towards Goals: Progressing   Interventions: Interventions utilized:  CBT and Strength-based      Assessment: Patient is now two weeks into radiation and having some fatigue, although could be lingering from Covid as well.  Discussed managing side effects and using cognitive messaging to manage stress around those. Focused on what is in versus out of control, for instance in trying to attend a food truck festival and being concerned about energy levels.    Spent the end of the visit processing concerns with visual of her breast/nipple.  This connected to concerns that may arise in survivorship, including fear of recurrence and ways to cope with this normal occurrence.      Plan: Follow up with CSW: 2 weeks (after last radiation) Behavioral recommendations: Focus on what is in versus out of your control.  If stuck on things you can't control, try a deep breath and five senses to come back to the moment using mindfulness.  Referral(s): Support group.  Added to [facility] interest list for fall       Elen Acero E Kiko Ripp, LCSW

## 2024-02-11 ENCOUNTER — Ambulatory Visit
Admission: RE | Admit: 2024-02-11 | Discharge: 2024-02-11 | Disposition: A | Discharge status: Home / Self Care | Payer: Managed Care, Other (non HMO) | Source: Ambulatory Visit | Attending: Radiation Oncology | Admitting: Radiation Oncology

## 2024-02-11 ENCOUNTER — Other Ambulatory Visit: Payer: Self-pay

## 2024-02-11 DIAGNOSIS — C50911 Malignant neoplasm of unspecified site of right female breast: Secondary | ICD-10-CM | POA: Diagnosis not present

## 2024-02-11 LAB — RAD ONC ARIA SESSION SUMMARY
Course Elapsed Days: 13
Plan Fractions Treated to Date: 10
Plan Prescribed Dose Per Fraction: 2.66 Gy
Plan Total Fractions Prescribed: 16
Plan Total Prescribed Dose: 42.56 Gy
Reference Point Dosage Given to Date: 26.6 Gy
Reference Point Session Dosage Given: 2.66 Gy
Session Number: 10

## 2024-02-12 ENCOUNTER — Ambulatory Visit: Admitting: Rehabilitative and Restorative Service Providers"

## 2024-02-12 ENCOUNTER — Ambulatory Visit
Admission: RE | Admit: 2024-02-12 | Discharge: 2024-02-12 | Disposition: A | Discharge status: Home / Self Care | Payer: Managed Care, Other (non HMO) | Source: Ambulatory Visit | Attending: Radiation Oncology | Admitting: Radiation Oncology

## 2024-02-12 ENCOUNTER — Encounter: Payer: Self-pay | Admitting: Rehabilitative and Restorative Service Providers"

## 2024-02-12 ENCOUNTER — Other Ambulatory Visit: Payer: Self-pay

## 2024-02-12 DIAGNOSIS — C50411 Malignant neoplasm of upper-outer quadrant of right female breast: Secondary | ICD-10-CM

## 2024-02-12 DIAGNOSIS — G8929 Other chronic pain: Secondary | ICD-10-CM

## 2024-02-12 DIAGNOSIS — C50911 Malignant neoplasm of unspecified site of right female breast: Secondary | ICD-10-CM | POA: Diagnosis not present

## 2024-02-12 DIAGNOSIS — Z9189 Other specified personal risk factors, not elsewhere classified: Secondary | ICD-10-CM

## 2024-02-12 DIAGNOSIS — Z170 Estrogen receptor positive status [ER+]: Secondary | ICD-10-CM

## 2024-02-12 DIAGNOSIS — M25611 Stiffness of right shoulder, not elsewhere classified: Secondary | ICD-10-CM

## 2024-02-12 DIAGNOSIS — Z483 Aftercare following surgery for neoplasm: Secondary | ICD-10-CM

## 2024-02-12 LAB — RAD ONC ARIA SESSION SUMMARY
Course Elapsed Days: 14
Plan Fractions Treated to Date: 11
Plan Prescribed Dose Per Fraction: 2.66 Gy
Plan Total Fractions Prescribed: 16
Plan Total Prescribed Dose: 42.56 Gy
Reference Point Dosage Given to Date: 29.26 Gy
Reference Point Session Dosage Given: 2.66 Gy
Session Number: 11

## 2024-02-12 NOTE — Therapy (Signed)
 OUTPATIENT PHYSICAL THERAPY BREAST CANCER POST OP TREATMENT NOTE   Patient Name: Ariel Ayala MRN: [id number] DOB:[birth year], 48 y.o., female Today's Date: 02/12/2024  END OF SESSION:  PT End of Session - 02/12/24 1019     Visit Number 10    Date for PT Re-Evaluation 03/01/24    Authorization Type [employer]    Authorization - Visit Number 9    Authorization - Number of Visits 20    PT Start Time 1017    PT Stop Time 1055    PT Time Calculation (min) 38 min    Activity Tolerance Patient tolerated treatment well    Behavior During Therapy WFL for tasks assessed/performed             Past Medical History:  Diagnosis Date   Asthma    as a child   Bloating 10/23/2020   CMV (cytomegalovirus infection) (HCC)    Early satiety 10/23/2020   Generalized abdominal pain 10/23/2020   Hair loss 03/12/2023   Indigestion 10/23/2020   Invasive ductal carcinoma of breast, female, 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   Splenomegaly    on CT   Vitamin D deficiency 10/25/2020   Past Surgical History:  Procedure Laterality Date   BREAST BIOPSY Right 11/28/2023   MM RT BREAST BX W LOC DEV 1ST LESION IMAGE BX SPEC STEREO GUIDE 11/28/2023 [facility]   BREAST BIOPSY  12/23/2023   MM RT RADIOACTIVE SEED LOC MAMMO GUIDE 12/23/2023 [facility]   BREAST LUMPECTOMY WITH RADIOACTIVE SEED AND SENTINEL LYMPH NODE BIOPSY Right 12/24/2023   Procedure: RIGHT BREAST LUMPECTOMY WITH RADIOACTIVE SEED AND SENTINEL LYMPH NODE BIOPSY;  Surgeon: Abigail Miyamoto, MD;  Location: [facility] OR;  Service: General;  Laterality: Right;   DILATION AND CURETTAGE OF UTERUS     FOOT SURGERY Right    with hardware   trigger thumb Right    Patient Active Problem List   Diagnosis Date Noted   Community acquired pneumonia 01/06/2024   Genetic testing 12/22/2023   Invasive ductal carcinoma of breast, female, 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   Malignant neoplasm of upper-outer quadrant of right breast in female, estrogen receptor positive  (HCC) 12/04/2023   Iron deficiency 07/02/2023   Mixed hyperlipidemia 07/02/2023   Perimenopause 03/12/2023   Tenosynovitis of right hand 01/29/2023   Osteoarthritis of carpometacarpal (CMC) joint of thumb 01/29/2023   Adhesive capsulitis of right shoulder 08/01/2022   Body mass index (BMI) of 26.0-26.9 in adult 04/02/2022   Anxiety 04/01/2022   Primary insomnia 04/01/2022   Abnormal cervical Papanicolaou smear 11/06/2021   Genital herpes simplex 03/13/2021   Vitamin D deficiency 10/25/2020   Vegetarian diet 10/23/2020   Encounter for annual physical exam 10/23/2020   TMJ tenderness, bilateral 02/11/2020   Seasonal allergies 02/11/2020   Family history of thyroid disease in mother 05/09/2017   Elevated TSH 05/09/2017   CMV (cytomegalovirus infection) (HCC)    Splenomegaly 04/16/2017   Asthma 11/12/2016    PCP: Dr. Enid Skeens   REFERRING PROVIDER: Dr Abigail Miyamoto   REFERRING DIAG: Right breast cancer, right shoulder pain and tightness  THERAPY DIAG:  Malignant neoplasm of upper-outer quadrant of right breast in female, estrogen receptor positive (HCC)  At risk for lymphedema  Aftercare following surgery for neoplasm  Chronic right shoulder pain  Stiffness of right shoulder, not elsewhere classified  Rationale for Evaluation and Treatment: Rehabilitation  ONSET DATE: 11/28/23  SUBJECTIVE:  SUBJECTIVE STATEMENT: I am moving more and feel like motion is better. It feels better to stretch in the morning because I couldn't before it was too painful. I don't have much pain at rest. Pain is about 25-30% better  PERTINENT HISTORY:  Patient was diagnosed on 11/28/2023 with right grade 1 invasive ductal carcinoma breast cancer. It measures 9 mm and is located in the upper outer quadrant. It is  ER/PR positive and HER2 negative with a Ki67 of 5%. Rt lumpectomy and SLNB on 12/24/23 with 3 negative nodes removed.  Will be having radiation. Right shoulder adhesive capsulitis diagnosed in mid 2023.   PATIENT GOALS:  Reassess how my recovery is going related to arm function, pain, and swelling.  PAIN:  Are you having pain? Yes: NPRS scale: 1-2/10 Pain location: Outer deltoid region now after the simulation  Pain description: achy  Aggravating factors: keeping it overhead  Relieving factors: avoiding overhead activities  PRECAUTIONS: Recent Surgery, right UE Lymphedema risk  RED FLAGS: None   ACTIVITY LEVEL / LEISURE: Back to normal activities except hard cleaning    OBJECTIVE:   PATIENT SURVEYS:  12/10/2023:  Nino Parsley Dash 15.91 12/18/2023:  Quick Dash 34.09 01/19/2024:  QUICK DASH:  25%  02/02/2023  Quick Dash 22.73  OBSERVATIONS: Wearing compression, healing incisions with glue  POSTURE:  Rounded shoulders    LYMPHEDEMA ASSESSMENT:  UPPER EXTREMITY AROM/PROM:   A/PROM RIGHT   eval   Right A/ROM (in standing) 12/18/23 01/19/24 Right A/ROM (in standing) 01/28/24  Shoulder extension 40 42 40   Shoulder flexion 130 100 A/ROM 134 AA/ROM 130 106 112 after dry neeling  Shoulder abduction 118 and painful 110 with pain 109 pn 108 115 after dry needling  Shoulder internal rotation 30 and painful  25 pn   Shoulder external rotation 80  80                           (Blank rows = not tested)   A/PROM LEFT   eval  Shoulder extension 40  Shoulder flexion 152  Shoulder abduction 172  Shoulder internal rotation 58  Shoulder external rotation 90                          (Blank rows = not tested)   CERVICAL AROM: All within normal limits   UPPER EXTREMITY STRENGTH: Right shoulder - did not tolerate testing due to pain with any resistance; Left shoulder WNL   LANDMARK RIGHT   eval 01/19/24  10 cm proximal to olecranon process 28.5 29  Olecranon process 23.8 25  10  cm proximal  to ulnar styloid process 22 21  Just proximal to ulnar styloid process 14.2 14.5  Across hand at thumb web space 16.2 16.5  At base of 2nd digit 6 6.2  (Blank rows = not tested)   LANDMARK LEFT   eval  10 cm proximal to olecranon process 28.2  Olecranon process 23.8  10 cm proximal to ulnar styloid process 21.2  Just proximal to ulnar styloid process 14.6  Across hand at thumb web space 16.8  At base of 2nd digit 5.9  (Blank rows = not tested)  Surgery type/Date: 12/24/23 Number of lymph nodes removed: 3 Current/past treatment (chemo, radiation, hormone therapy): starting radiation 01/29/24 Other symptoms:   TODAY"S TREATMENT Pt permission and consent throughout each step of examination and treatment with modification and draping if requested when working on  sensitive areas  DATE:  02/12/2024 Shoulder pulleys for flexion and abduction x2 min each Seated shoulder flexion with blue pball 2x10 Seated shoulder abduction with blue pball 2x10 Seated Active Assisted shoulder flexion with grasped hands x10 Seated chest stretch with hands behind head 5x5 sec hold Standing UE Ranger for flexion (in cancer gym) 2x10 Standing flexion at finger ladder 2x10 Standing scaption at finger ladder 2x10 Standing rows with yellow tband 2x10 Standing shoulder extension with yellow tband 2x10 Standing shoulder ER with yellow tband 2x10 Standing shoulder IR stretch with towel 3x20 sec Seated shoulder flexion (to 90 degrees) with 1# dumbbell 2x10 Seated shoulder rolls x10 each direction   DATE:  02/02/2024 Discussed progress with pain/goals Finger ladder x 10 Standing UE Ranger for flexion (in cancer gym) 2x10 Pulleys x 2 min flex and abd Yellow band scapular retraction, shoulder extension, ER x 10 Supine wand flexion, scaption x5  GH mobs right shoulder Gr 3 post and inferior, PROM right shoulder flexion, scaption, abduction, IR and ER.  Updated HEP with postural TB and gave yellow band for home  use Also showed IR behind back with towel and added to HEP   DATE:  01/28/2024 Shoulder pulleys for flexion and abduction x2 min each Seated shoulder flexion with blue pball 2x10 Seated shoulder abduction with blue pball 2x10 Seated Active Assisted shoulder flexion with grasped hands x10 Seated chest stretch with hands behind head 5x5 sec hold Standing UE Ranger for flexion (in cancer gym) 2x10 Standing flexion at finger ladder x10 Standing scaption at finger ladder x10 Trigger Point Dry Needling Subsequent Treatment: Instructions provided previously at initial dry needling treatment.  Patient Verbal Consent Given: Yes Education Handout Provided: Previously Provided Muscles Treated: right upper trap and right delts Electrical Stimulation Performed: No Treatment Response/Outcome: Utilized skilled palpation to identify bony landmarks and trigger points.  Able to illicit twitch response and muscle elongation.  Soft tissue mobilization with cocoa butter following to further promote tissue elongation.        PATIENT EDUCATION:  Education details: per today's note Person educated: Patient Education method: Programmer, multimedia, Facilities manager, Verbal cues, and Handouts Education comprehension: verbalized understanding and returned demonstration  HOME EXERCISE PROGRAM: Access Code: [email or web address] URL: [email or web address] Date: 02/12/2024 Prepared by: Reather Laurence  Exercises - Seated Scapular Retraction  - 2 x daily - 7 x weekly - 2 sets - 10 reps - Seated AAROM Shoulder Flexion  - 2 x daily - 7 x weekly - 2 sets - 10 reps - Standing Shoulder Abduction Finger Walk at Wall  - 2 x daily - 7 x weekly - 2 sets - 10 reps - Seated Chest Stretch with Hands Behind Head  - 2 x daily - 7 x weekly - 3 reps - 5 second hold - Seated Flexion Stretch with Swiss Ball  - 1 x daily - 7 x weekly - 2 sets - 10 reps - Standing Shoulder Abduction AAROM with Swiss Ball on Table  - 1 x daily - 7 x weekly -  3 sets - 10 reps - Supine Chest Stretch with Elbows Bent  - 2 x daily - 7 x weekly - 3 reps - 5 sec hold - Supine Shoulder Flexion Extension AAROM with Dowel  - 1 x daily - 7 x weekly - 1-2 sets - 10 reps - Standing Shoulder Row with Anchored Resistance  - 1 x daily - 7 x weekly - 2 sets - 10 reps - Shoulder External Rotation and Scapular Retraction with Resistance  -  1 x daily - 7 x weekly - 2 sets - 10 reps - Shoulder extension with resistance - Neutral  - 1 x daily - 7 x weekly - 2 sets - 10 reps - Standing Shoulder Internal Rotation Stretch with Towel  - 1 x daily - 7 x weekly - 2 reps - 20 sec hold  ASSESSMENT:  CLINICAL IMPRESSION: Donnarae presents to skilled PT reporting that she has been doing her exercises and using the theraband.  Provided patient with an updated copy of HEP to include all exercises on the printout.  Patient continues to progress with ROM and strengthening during session.  Patient with great participation and motivation throughout session.  Able to add in strengthening with 1# dumbbell today with patient reporting fatigue.  Patient may need to decrease frequency, as she has a strict visit limit of 20 during the year.   Pt will benefit from skilled therapeutic intervention to improve on the following deficits: Decreased knowledge of precautions, impaired UE functional use, pain, decreased ROM, postural dysfunction.   PT treatment/interventions: ADL/Self care home management, [phone]- PT Re-evaluation, 97110-Therapeutic exercises, 97530- Therapeutic activity, [id number]- Neuromuscular re-education, 97535- Self Care, [id number]- Manual therapy, Dry Needling, and DME instructions   GOALS: Goals reviewed with patient? Yes  LONG TERM GOALS:  (STG=LTG)  GOALS Name Initial Status:  Goal status  1 Pt will demonstrate she has regained full shoulder ROM and function post operatively compared to baselines.  Baseline:  MET  2 Pt will improve baseline shoulder AROM by at least 10 degrees  into flexion, abduction, and IR to demonstrate improved mobility  Flex; 130 Abd: 109 IR: 25 Ongoing  3 Pt will decrease QDASH to 15% to return to baseline status  22% In progress  4 Pt will be ind with final HEP  In Progress  5 Pt will be able to fasten bra behind her back  NEW     PLAN:  PT FREQUENCY/DURATION: 1-2x per week x 4 weeks and SOZO Q 3 months   PLAN FOR NEXT SESSION: Lt shoulder PROM/AAROM, MT for adhesive capsulitis.  Added DN to POC but would not do during radiation or at least 6 weeks post radiation or surgery.  Theraband for scapular strength   Reather Laurence, PT, DPT 02/12/24, 11:56 AM  [facility] [address], [address] [place], [place] [address] Phone # [phone] Fax [phone]

## 2024-02-13 ENCOUNTER — Other Ambulatory Visit: Payer: Self-pay

## 2024-02-13 ENCOUNTER — Ambulatory Visit
Admission: RE | Admit: 2024-02-13 | Discharge: 2024-02-13 | Disposition: A | Discharge status: Home / Self Care | Source: Ambulatory Visit | Attending: Radiation Oncology | Admitting: Radiation Oncology

## 2024-02-13 ENCOUNTER — Ambulatory Visit
Admission: RE | Admit: 2024-02-13 | Discharge: 2024-02-13 | Disposition: A | Discharge status: Home / Self Care | Payer: Managed Care, Other (non HMO) | Source: Ambulatory Visit | Attending: Radiation Oncology | Admitting: Radiation Oncology

## 2024-02-13 DIAGNOSIS — C50911 Malignant neoplasm of unspecified site of right female breast: Secondary | ICD-10-CM | POA: Diagnosis not present

## 2024-02-13 LAB — RAD ONC ARIA SESSION SUMMARY
Course Elapsed Days: 15
Plan Fractions Treated to Date: 12
Plan Prescribed Dose Per Fraction: 2.66 Gy
Plan Total Fractions Prescribed: 16
Plan Total Prescribed Dose: 42.56 Gy
Reference Point Dosage Given to Date: 31.92 Gy
Reference Point Session Dosage Given: 2.66 Gy
Session Number: 12

## 2024-02-14 ENCOUNTER — Telehealth: Payer: Self-pay | Admitting: Hematology and Oncology

## 2024-02-14 NOTE — Telephone Encounter (Signed)
Called patient twice line busy

## 2024-02-16 ENCOUNTER — Ambulatory Visit

## 2024-02-16 ENCOUNTER — Ambulatory Visit
Admission: RE | Admit: 2024-02-16 | Discharge: 2024-02-16 | Disposition: A | Discharge status: Home / Self Care | Payer: Managed Care, Other (non HMO) | Source: Ambulatory Visit | Attending: Radiation Oncology

## 2024-02-16 ENCOUNTER — Encounter: Payer: Self-pay | Admitting: Radiation Oncology

## 2024-02-16 ENCOUNTER — Other Ambulatory Visit: Payer: Self-pay

## 2024-02-16 DIAGNOSIS — C50911 Malignant neoplasm of unspecified site of right female breast: Secondary | ICD-10-CM | POA: Diagnosis not present

## 2024-02-16 LAB — RAD ONC ARIA SESSION SUMMARY
Course Elapsed Days: 18
Plan Fractions Treated to Date: 13
Plan Prescribed Dose Per Fraction: 2.66 Gy
Plan Total Fractions Prescribed: 16
Plan Total Prescribed Dose: 42.56 Gy
Reference Point Dosage Given to Date: 34.58 Gy
Reference Point Session Dosage Given: 2.66 Gy
Session Number: 13

## 2024-02-17 ENCOUNTER — Ambulatory Visit: Attending: Surgery

## 2024-02-17 ENCOUNTER — Ambulatory Visit
Admission: RE | Admit: 2024-02-17 | Discharge: 2024-02-17 | Disposition: A | Discharge status: Home / Self Care | Payer: Managed Care, Other (non HMO) | Source: Ambulatory Visit | Attending: Radiation Oncology

## 2024-02-17 ENCOUNTER — Other Ambulatory Visit: Payer: Self-pay

## 2024-02-17 ENCOUNTER — Encounter: Payer: Self-pay | Admitting: Radiation Oncology

## 2024-02-17 DIAGNOSIS — R600 Localized edema: Secondary | ICD-10-CM | POA: Insufficient documentation

## 2024-02-17 DIAGNOSIS — Z9189 Other specified personal risk factors, not elsewhere classified: Secondary | ICD-10-CM | POA: Diagnosis present

## 2024-02-17 DIAGNOSIS — M25611 Stiffness of right shoulder, not elsewhere classified: Secondary | ICD-10-CM

## 2024-02-17 DIAGNOSIS — M25511 Pain in right shoulder: Secondary | ICD-10-CM | POA: Diagnosis present

## 2024-02-17 DIAGNOSIS — G8929 Other chronic pain: Secondary | ICD-10-CM | POA: Diagnosis present

## 2024-02-17 DIAGNOSIS — I890 Lymphedema, not elsewhere classified: Secondary | ICD-10-CM | POA: Insufficient documentation

## 2024-02-17 DIAGNOSIS — Z510 Encounter for antineoplastic radiation therapy: Secondary | ICD-10-CM | POA: Diagnosis present

## 2024-02-17 DIAGNOSIS — Z170 Estrogen receptor positive status [ER+]: Secondary | ICD-10-CM | POA: Insufficient documentation

## 2024-02-17 DIAGNOSIS — C50411 Malignant neoplasm of upper-outer quadrant of right female breast: Secondary | ICD-10-CM | POA: Insufficient documentation

## 2024-02-17 DIAGNOSIS — Z483 Aftercare following surgery for neoplasm: Secondary | ICD-10-CM

## 2024-02-17 LAB — RAD ONC ARIA SESSION SUMMARY
Course Elapsed Days: 19
Plan Fractions Treated to Date: 14
Plan Prescribed Dose Per Fraction: 2.66 Gy
Plan Total Fractions Prescribed: 16
Plan Total Prescribed Dose: 42.56 Gy
Reference Point Dosage Given to Date: 37.24 Gy
Reference Point Session Dosage Given: 2.66 Gy
Session Number: 14

## 2024-02-17 NOTE — Progress Notes (Signed)
 The patient is currently being treated for Stage IA, [id number], grade 1 ER/PR positive invasive ductal carcinoma of the right breast and has received 14 of the planned 20 fractions of radiation to the right breast.  She is seen today due to engorgement of the right breast, and discomfort around her incision site.  She states that she had been wearing her compression bra and took this off for a few weeks, and in that timeframe has gradually noticed more fullness, and discomfort, she is starting to have mild radiation dermatitis diffusely but no evidence of desquamation.  The nipple is intact without any crusting or disruption but her incision is somewhat drawn outward from the base.  There is no opening or drainage.  We discussed that this appears to be consistent with lymphedema, she was seen in physical therapy today, and describes some of her symptoms but I think in viewing her skin and after putting sonafine cream in the skin, it is clear that her pores are more tight and that her skin is more taut than just looking at it without the cream identifying these features.  We discussed using a compression bra routinely for the next 2 weeks, sleeping in it at nighttime as well and I will reach out to physical therapy about the possibility of massage.  We will switch from RadiaPlex cream to sonafine and 2 new tubes of this was provided to the patient in addition to Telfa dressings.  We also discussed that if her nipple becomes cracked or more irritated that she can use Aquaphor in this area.  She is allergic to sulfa so we will avoid Silvadene in the future as well.

## 2024-02-17 NOTE — Therapy (Signed)
 OUTPATIENT PHYSICAL THERAPY BREAST CANCER POST OP TREATMENT NOTE   Patient Name: Ariel Ayala MRN: [id number] DOB:01/01/1976, 48 y.o., female Today's Date: 02/17/2024  END OF SESSION:  PT End of Session - 02/17/24 0904     Visit Number 11    Number of Visits 17    Date for PT Re-Evaluation 03/01/24    Authorization Type [employer]    Authorization - Visit Number 10    Authorization - Number of Visits 20    PT Start Time 0905    PT Stop Time 1000    PT Time Calculation (min) 55 min    Activity Tolerance Patient tolerated treatment well    Behavior During Therapy [facility] for tasks assessed/performed             Past Medical History:  Diagnosis Date   Asthma    as a child   Bloating 10/23/2020   CMV (cytomegalovirus infection) (HCC)    Early satiety 10/23/2020   Generalized abdominal pain 10/23/2020   Hair loss 03/12/2023   Indigestion 10/23/2020   Invasive ductal carcinoma of breast, female, 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   Splenomegaly    on CT   Vitamin D deficiency 10/25/2020   Past Surgical History:  Procedure Laterality Date   BREAST BIOPSY Right 11/28/2023   MM RT BREAST BX W LOC DEV 1ST LESION IMAGE BX SPEC STEREO GUIDE 11/28/2023 [facility]   BREAST BIOPSY  12/23/2023   MM RT RADIOACTIVE SEED LOC MAMMO GUIDE 12/23/2023 [facility]   BREAST LUMPECTOMY WITH RADIOACTIVE SEED AND SENTINEL LYMPH NODE BIOPSY Right 12/24/2023   Procedure: RIGHT BREAST LUMPECTOMY WITH RADIOACTIVE SEED AND SENTINEL LYMPH NODE BIOPSY;  Surgeon: Abigail Miyamoto, MD;  Location: [facility] OR;  Service: General;  Laterality: Right;   DILATION AND CURETTAGE OF UTERUS     FOOT SURGERY Right    with hardware   trigger thumb Right    Patient Active Problem List   Diagnosis Date Noted   Community acquired pneumonia 01/06/2024   Genetic testing 12/22/2023   Invasive ductal carcinoma of breast, female, 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   Malignant neoplasm of upper-outer quadrant of right breast in female,  estrogen receptor positive (HCC) 12/04/2023   Iron deficiency 07/02/2023   Mixed hyperlipidemia 07/02/2023   Perimenopause 03/12/2023   Tenosynovitis of right hand 01/29/2023   Osteoarthritis of carpometacarpal (CMC) joint of thumb 01/29/2023   Adhesive capsulitis of right shoulder 08/01/2022   Body mass index (BMI) of 26.0-26.9 in adult 04/02/2022   Anxiety 04/01/2022   Primary insomnia 04/01/2022   Abnormal cervical Papanicolaou smear 11/06/2021   Genital herpes simplex 03/13/2021   Vitamin D deficiency 10/25/2020   Vegetarian diet 10/23/2020   Encounter for annual physical exam 10/23/2020   TMJ tenderness, bilateral 02/11/2020   Seasonal allergies 02/11/2020   Family history of thyroid disease in mother 05/09/2017   Elevated TSH 05/09/2017   CMV (cytomegalovirus infection) (HCC)    Splenomegaly 04/16/2017   Asthma 11/12/2016    PCP: Dr. Enid Skeens   REFERRING PROVIDER: Dr Abigail Miyamoto   REFERRING DIAG: Right breast cancer, right shoulder pain and tightness  THERAPY DIAG:  Malignant neoplasm of upper-outer quadrant of right breast in female, estrogen receptor positive (HCC)  At risk for lymphedema  Aftercare following surgery for neoplasm  Chronic right shoulder pain  Stiffness of right shoulder, not elsewhere classified  Rationale for Evaluation and Treatment: Rehabilitation  ONSET DATE: 11/28/23  SUBJECTIVE:  SUBJECTIVE STATEMENT:  I have 7 more radiation treatments left. I ddin't sleep well last night. My skin is really irritated from the radiation. I do my exercises several times a day while working/. 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 It measures 9 mm and is located in the upper outer quadrant.  It is ER/PR positive and HER2 negative with a Ki67 of 5%. Rt lumpectomy and SLNB on 12/24/23 with 3 negative nodes removed.  Will be having radiation. Right shoulder adhesive capsulitis diagnosed in mid 2023.   PATIENT GOALS:  Reassess how my recovery is going related to arm function, pain, and swelling.  PAIN:  Are you having pain? Yes: NPRS scale: 4/10 Pain location: Outer deltoid region now after the simulation shoulder and right breast Pain description: achy  Aggravating factors: keeping it overhead  Relieving factors: avoiding overhead activities  PRECAUTIONS: Recent Surgery, right UE Lymphedema risk  RED FLAGS: None   ACTIVITY LEVEL / LEISURE: Back to normal activities except hard cleaning    OBJECTIVE:   PATIENT SURVEYS:  12/10/2023:  Nino Parsley Dash 15.91 12/18/2023:  Quick Dash 34.09 01/19/2024:  QUICK DASH:  25%  02/02/2023  Quick Dash 22.73  OBSERVATIONS: Wearing compression, healing incisions with glue  POSTURE:  Rounded shoulders    LYMPHEDEMA ASSESSMENT:  UPPER EXTREMITY AROM/PROM:   A/PROM RIGHT   eval   Right A/ROM (in standing) 12/18/23 01/19/24 Right A/ROM (in standing) 01/28/24  Shoulder extension 40 42 40   Shoulder flexion 130 100 A/ROM 134 AA/ROM 130 106 112 after dry neeling  Shoulder abduction 118 and painful 110 with pain 109 pn 108 115 after dry needling  Shoulder internal rotation 30 and painful  25 pn   Shoulder external rotation 80  80                           (Blank rows = not tested)   A/PROM LEFT   eval  Shoulder extension 40  Shoulder flexion 152  Shoulder abduction 172  Shoulder internal rotation 58  Shoulder external rotation 90                          (Blank rows = not tested)   CERVICAL AROM: All within normal limits   UPPER EXTREMITY STRENGTH: Right shoulder - did not tolerate testing due to pain with any resistance; Left shoulder WNL   LANDMARK RIGHT   eval 01/19/24  10 cm proximal to olecranon process 28.5 29  Olecranon  process 23.8 25  10  cm proximal to ulnar styloid process 22 21  Just proximal to ulnar styloid process 14.2 14.5  Across hand at thumb web space 16.2 16.5  At base of 2nd digit 6 6.2  (Blank rows = not tested)   LANDMARK LEFT   eval  10 cm proximal to olecranon process 28.2  Olecranon process 23.8  10 cm proximal to ulnar styloid process 21.2  Just proximal to ulnar styloid process 14.6  Across hand at thumb web space 16.8  At base of 2nd digit 5.9  (Blank rows = not tested)  Surgery type/Date: 12/24/23 Number of lymph nodes removed: 3 Current/past treatment (chemo, radiation, hormone therapy): starting radiation 01/29/24 Other symptoms:   TODAY"S TREATMENT Pt permission and consent throughout each step of examination and treatment with modification and draping if requested when working on sensitive areas  DATE:  02/17/2024 Finger ladder flexion x 10 to warm up Single arm pec stretch x 3, 20-30 sec scapular retraction, ext, ER with yellow band 2 x 10 ea Pulleys flex and scaption x 2 min ea Shoulder ranger flexion 2 x 10 to facilitate reaching Standing shoulder flexion and scaption x 10, no resistance to facilitate reaching IR stretch with strap 5 , 10sec hold STM with cocoa butter to right UT and right mid deltoid secondary to increased tissue tension. Cocoa butter removed with alcohol wipes afterward secondary to radiation at 2 pm    02/12/2024 Shoulder pulleys for flexion and abduction x2 min each Seated shoulder flexion with blue pball 2x10 Seated shoulder abduction with blue pball 2x10 Seated Active Assisted shoulder flexion with grasped hands x10 Seated chest stretch with hands behind head 5x5 sec hold Standing UE Ranger for flexion (in cancer gym) 2x10 Standing flexion at finger ladder 2x10 Standing scaption at finger ladder 2x10 Standing rows with yellow tband 2x10 Standing shoulder extension with yellow tband 2x10 Standing shoulder ER with yellow tband 2x10 Standing  shoulder IR stretch with towel 3x20 sec Seated shoulder flexion (to 90 degrees) with 1# dumbbell 2x10 Seated shoulder rolls x10 each direction   DATE:  02/02/2024 Discussed progress with pain/goals Finger ladder x 10 Standing UE Ranger for flexion (in cancer gym) 2x10 Pulleys x 2 min flex and abd Yellow band scapular retraction, shoulder extension, ER x 10 Supine wand flexion, scaption x5  GH mobs right shoulder Gr 3 post and inferior, PROM right shoulder flexion, scaption, abduction, IR and ER.  Updated HEP with postural TB and gave yellow band for home use Also showed IR behind back with towel and added to HEP   DATE:  01/28/2024 Shoulder pulleys for flexion and abduction x2 min each Seated shoulder flexion with blue pball 2x10 Seated shoulder abduction with blue pball 2x10 Seated Active Assisted shoulder flexion with grasped hands x10 Seated chest stretch with hands behind head 5x5 sec hold Standing UE Ranger for flexion (in cancer gym) 2x10 Standing flexion at finger ladder x10 Standing scaption at finger ladder x10 Trigger Point Dry Needling Subsequent Treatment: Instructions provided previously at initial dry needling treatment.  Patient Verbal Consent Given: Yes Education Handout Provided: Previously Provided Muscles Treated: right upper trap and right delts Electrical Stimulation Performed: No Treatment Response/Outcome: Utilized skilled palpation to identify bony landmarks and trigger points.  Able to illicit twitch response and muscle elongation.  Soft tissue mobilization with cocoa butter following to further promote tissue elongation.        PATIENT EDUCATION:  Education details: per today's note Person educated: Patient Education method: Programmer, multimedia, Facilities manager, Verbal cues, and Handouts Education comprehension: verbalized understanding and returned demonstration  HOME EXERCISE PROGRAM: Access Code: [email or web address] URL: [email or web address] Date:  02/12/2024 Prepared by: Reather Laurence  Exercises - Seated Scapular Retraction  - 2 x daily - 7 x weekly - 2 sets - 10 reps - Seated AAROM Shoulder Flexion  - 2 x daily - 7 x weekly - 2 sets - 10 reps - Standing Shoulder Abduction Finger Walk at Wall  - 2 x daily - 7 x weekly - 2 sets - 10 reps - Seated Chest Stretch with Hands Behind Head  - 2 x daily - 7 x weekly - 3 reps - 5 second hold - Seated Flexion Stretch with Swiss Ball  - 1 x daily - 7 x weekly - 2 sets - 10 reps - Standing Shoulder Abduction AAROM with Swiss Ball on Table  -  1 x daily - 7 x weekly - 3 sets - 10 reps - Supine Chest Stretch with Elbows Bent  - 2 x daily - 7 x weekly - 3 reps - 5 sec hold - Supine Shoulder Flexion Extension AAROM with Dowel  - 1 x daily - 7 x weekly - 1-2 sets - 10 reps - Standing Shoulder Row with Anchored Resistance  - 1 x daily - 7 x weekly - 2 sets - 10 reps - Shoulder External Rotation and Scapular Retraction with Resistance  - 1 x daily - 7 x weekly - 2 sets - 10 reps - Shoulder extension with resistance - Neutral  - 1 x daily - 7 x weekly - 2 sets - 10 reps - Standing Shoulder Internal Rotation Stretch with Towel  - 1 x daily - 7 x weekly - 2 reps - 20 sec hold  ASSESSMENT:  CLINICAL IMPRESSION:  Pt was very stiff initially and was frequently working to mobilize her neck due to stiffness. Continued NM and therapeutic activities and in addition added STM to right UT and mid deltoid. Shoulder loosened up with progression of exercises and her neck felt looser after manual therapy.   Pt will benefit from skilled therapeutic intervention to improve on the following deficits: Decreased knowledge of precautions, impaired UE functional use, pain, decreased ROM, postural dysfunction.   PT treatment/interventions: ADL/Self care home management, [phone]- PT Re-evaluation, 97110-Therapeutic exercises, 97530- Therapeutic activity, [id number]- Neuromuscular re-education, 97535- Self Care, [id number]- Manual therapy,  Dry Needling, and DME instructions   GOALS: Goals reviewed with patient? Yes  LONG TERM GOALS:  (STG=LTG)  GOALS Name Initial Status:  Goal status  1 Pt will demonstrate she has regained full shoulder ROM and function post operatively compared to baselines.  Baseline:  MET  2 Pt will improve baseline shoulder AROM by at least 10 degrees into flexion, abduction, and IR to demonstrate improved mobility  Flex; 130 Abd: 109 IR: 25 Ongoing  3 Pt will decrease QDASH to 15% to return to baseline status  22% In progress  4 Pt will be ind with final HEP  In Progress  5 Pt will be able to fasten bra behind her back  NEW     PLAN:  PT FREQUENCY/DURATION: 1-2x per week x 4 weeks and SOZO Q 3 months (hard visit limit of 20 for insurance)  PLAN FOR NEXT SESSION: Lt shoulder PROM/AAROM, MT for adhesive capsulitis.  Added DN to POC but would not do during radiation or at least 6 weeks post radiation or surgery.  Theraband for scapular strength  Alvira Monday, PT 02/17/24 10:12 AM    [facility] [address] [place], [place] [address] Phone # [phone] Fax [phone]

## 2024-02-18 ENCOUNTER — Ambulatory Visit

## 2024-02-18 ENCOUNTER — Ambulatory Visit
Admission: RE | Admit: 2024-02-18 | Discharge: 2024-02-18 | Disposition: A | Discharge status: Home / Self Care | Payer: Managed Care, Other (non HMO) | Source: Ambulatory Visit | Attending: Radiation Oncology

## 2024-02-18 ENCOUNTER — Other Ambulatory Visit: Payer: Self-pay

## 2024-02-18 DIAGNOSIS — R600 Localized edema: Secondary | ICD-10-CM

## 2024-02-18 DIAGNOSIS — C50411 Malignant neoplasm of upper-outer quadrant of right female breast: Secondary | ICD-10-CM

## 2024-02-18 DIAGNOSIS — M25611 Stiffness of right shoulder, not elsewhere classified: Secondary | ICD-10-CM

## 2024-02-18 DIAGNOSIS — Z170 Estrogen receptor positive status [ER+]: Secondary | ICD-10-CM

## 2024-02-18 DIAGNOSIS — Z483 Aftercare following surgery for neoplasm: Secondary | ICD-10-CM

## 2024-02-18 DIAGNOSIS — I890 Lymphedema, not elsewhere classified: Secondary | ICD-10-CM

## 2024-02-18 DIAGNOSIS — G8929 Other chronic pain: Secondary | ICD-10-CM

## 2024-02-18 LAB — RAD ONC ARIA SESSION SUMMARY
Course Elapsed Days: 20
Plan Fractions Treated to Date: 15
Plan Prescribed Dose Per Fraction: 2.66 Gy
Plan Total Fractions Prescribed: 16
Plan Total Prescribed Dose: 42.56 Gy
Reference Point Dosage Given to Date: 39.9 Gy
Reference Point Session Dosage Given: 2.66 Gy
Session Number: 15

## 2024-02-18 NOTE — Therapy (Signed)
 OUTPATIENT PHYSICAL THERAPY BREAST CANCER POST OP TREATMENT NOTE   Patient Name: Ariel Ayala MRN: [id number] DOB:Nov 21, 1975, 48 y.o., female Today's Date: 02/18/2024  END OF SESSION:  PT End of Session - 02/18/24 1458     Visit Number 12    Number of Visits 20    Date for PT Re-Evaluation 03/31/24   Max visits 20   Authorization Type [employer]    Authorization - Visit Number 11    Authorization - Number of Visits 20    PT Start Time 1500    PT Stop Time 1557    PT Time Calculation (min) 57 min    Activity Tolerance Patient tolerated treatment well    Behavior During Therapy WFL for tasks assessed/performed             Past Medical History:  Diagnosis Date   Asthma    as a child   Bloating 10/23/2020   CMV (cytomegalovirus infection) (HCC)    Early satiety 10/23/2020   Generalized abdominal pain 10/23/2020   Hair loss 03/12/2023   Indigestion 10/23/2020   Invasive ductal carcinoma of breast, female, 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   Splenomegaly    on CT   Vitamin D deficiency 10/25/2020   Past Surgical History:  Procedure Laterality Date   BREAST BIOPSY Right 11/28/2023   MM RT BREAST BX W LOC DEV 1ST LESION IMAGE BX SPEC STEREO GUIDE 11/28/2023 [facility]   BREAST BIOPSY  12/23/2023   MM RT RADIOACTIVE SEED LOC MAMMO GUIDE 12/23/2023 [facility]   BREAST LUMPECTOMY WITH RADIOACTIVE SEED AND SENTINEL LYMPH NODE BIOPSY Right 12/24/2023   Procedure: RIGHT BREAST LUMPECTOMY WITH RADIOACTIVE SEED AND SENTINEL LYMPH NODE BIOPSY;  Surgeon: Abigail Miyamoto, MD;  Location: [facility];  Service: General;  Laterality: Right;   DILATION AND CURETTAGE OF UTERUS     FOOT SURGERY Right    with hardware   trigger thumb Right    Patient Active Problem List   Diagnosis Date Noted   Community acquired pneumonia 01/06/2024   Genetic testing 12/22/2023   Invasive ductal carcinoma of breast, female, 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   Malignant neoplasm of upper-outer quadrant of right breast  in female, estrogen receptor positive (HCC) 12/04/2023   Iron deficiency 07/02/2023   Mixed hyperlipidemia 07/02/2023   Perimenopause 03/12/2023   Tenosynovitis of right hand 01/29/2023   Osteoarthritis of carpometacarpal (CMC) joint of thumb 01/29/2023   Adhesive capsulitis of right shoulder 08/01/2022   Body mass index (BMI) of 26.0-26.9 in adult 04/02/2022   Anxiety 04/01/2022   Primary insomnia 04/01/2022   Abnormal cervical Papanicolaou smear 11/06/2021   Genital herpes simplex 03/13/2021   Vitamin D deficiency 10/25/2020   Vegetarian diet 10/23/2020   Encounter for annual physical exam 10/23/2020   TMJ tenderness, bilateral 02/11/2020   Seasonal allergies 02/11/2020   Family history of thyroid disease in mother 05/09/2017   Elevated TSH 05/09/2017   CMV (cytomegalovirus infection) (HCC)    Splenomegaly 04/16/2017   Asthma 11/12/2016    PCP: Dr. Enid Skeens   REFERRING PROVIDER: Dr Abigail Miyamoto   REFERRING DIAG: Right breast cancer, right shoulder pain and tightness  THERAPY DIAG:  Malignant neoplasm of upper-outer quadrant of right breast in female, estrogen receptor positive (HCC)  Aftercare following surgery for neoplasm  Chronic right shoulder pain  Stiffness of right shoulder, not elsewhere classified  Lymphedema, not elsewhere classified  Localized edema  Rationale for Evaluation and Treatment: Rehabilitation  ONSET DATE: 02/14/2024  SUBJECTIVE:  SUBJECTIVE STATEMENT: I saw Joni Reining after radiation . I was concerned because my breast had gotten really swollen. I wore my compression bra for 4 weeks after surgery, and in the beginning of radiation, but when I started getting sore I stopped wearing it. It started getting more swollen last weekend. The nipple is  really sore  PERTINENT HISTORY:  Patient was diagnosed on 11/28/2023 with right grade 1 invasive ductal carcinoma breast cancer. It measures 9 mm and is located in the upper outer quadrant. It is ER/PR positive and HER2 negative with a Ki67 of 5%. Rt lumpectomy and SLNB on 12/24/23 with 3 negative nodes removed.  Will be having radiation. Right shoulder adhesive capsulitis diagnosed in mid 2023.   PATIENT GOALS:  Reassess how my recovery is going related to arm function, pain, and swelling.  PAIN:  Are you having pain? Yes: NPRS scale: 4/10 Pain location: right breast especially the nipple area Pain description: achy, twinges, tight, heavy  Aggravating factors: movement, shirt rubbing the wrong way Relieving factors: cream,  PRECAUTIONS: Recent Surgery, right UE Lymphedema risk  RED FLAGS: None   ACTIVITY LEVEL / LEISURE: Back to normal activities except hard cleaning    OBJECTIVE:   PATIENT SURVEYS:  12/10/2023:  Nino Parsley Dash 15.91 12/18/2023:  Quick Dash 34.09 01/19/2024:  QUICK DASH:  25%  02/02/2023  Quick Dash 22.73   BREAST COMPLAINTS QUESTIONNAIRE( 02/18/2024) Pain:4 Heaviness:3 Swollen feeling:3 Tense Skin:3 Redness:7 Bra Print:5 Size of Pores:5 Hard feeling:8  Total:    38 /80 A Score over 9 indicates lymphedema issues in the breast   OBSERVATIONS: Wearing compression, healing incisions with glue   02/18/2024; wearing compression bra, area of fibrosis proximal to incision. Generalized right breast swelling with mildly enlarged pores inferior to nipple area. Upper lateral nipple with area of elevation/swelling that has been present since surgery. Mild redness from radiation. ? Scabbing at axillary incision vs darkened skin from radiation  POSTURE:  Rounded shoulders    LYMPHEDEMA ASSESSMENT:  UPPER EXTREMITY AROM/PROM:   A/PROM RIGHT   eval   Right A/ROM (in standing) 12/18/23 01/19/24 Right A/ROM (in standing) 01/28/24  Shoulder extension 40 42 40   Shoulder  flexion 130 100 A/ROM 134 AA/ROM 130 106 112 after dry neeling  Shoulder abduction 118 and painful 110 with pain 109 pn 108 115 after dry needling  Shoulder internal rotation 30 and painful  25 pn   Shoulder external rotation 80  80                           (Blank rows = not tested)   A/PROM LEFT   eval  Shoulder extension 40  Shoulder flexion 152  Shoulder abduction 172  Shoulder internal rotation 58  Shoulder external rotation 90                          (Blank rows = not tested)   CERVICAL AROM: All within normal limits   UPPER EXTREMITY STRENGTH: Right shoulder - did not tolerate testing due to pain with any resistance; Left shoulder WNL   LANDMARK RIGHT   eval 01/19/24  10 cm proximal to olecranon process 28.5 29  Olecranon process 23.8 25  10  cm proximal to ulnar styloid process 22 21  Just proximal to ulnar styloid process 14.2 14.5  Across hand at thumb web space 16.2 16.5  At base of 2nd digit 6 6.2  (  Blank rows = not tested)   LANDMARK LEFT   eval  10 cm proximal to olecranon process 28.2  Olecranon process 23.8  10 cm proximal to ulnar styloid process 21.2  Just proximal to ulnar styloid process 14.6  Across hand at thumb web space 16.8  At base of 2nd digit 5.9  (Blank rows = not tested)  Surgery type/Date: 12/24/23 Number of lymph nodes removed: 3 Current/past treatment (chemo, radiation, hormone therapy): starting radiation 01/29/24 Other symptoms:   TODAY"S TREATMENT Pt permission and consent throughout each step of examination and treatment with modification and draping if requested when working on sensitive areas  DATE: 02/18/2024 Therapist performed and then instructed pt in MLD techniques to reduce Right breast swelling. In supine: Short neck, 5 diaphragmatic breaths, R and L axillary nodes and establishment of interaxillary pathway, R inguinal nodes and establishment of axilloinguinal pathway, then R breast moving fluid towards pathways spending  extra time in any areas of fibrosis then retracing all steps. Pt was given written and illustrated instructions.  She was advised to wear her compression bra as much as possible unless her skin is unable to tolerate it from radiation   02/17/2024 Finger ladder flexion x 10 to warm up Single arm pec stretch x 3, 20-30 sec scapular retraction, ext, ER with yellow band 2 x 10 ea Pulleys flex and scaption x 2 min ea Shoulder ranger flexion 2 x 10 to facilitate reaching Standing shoulder flexion and scaption x 10, no resistance to facilitate reaching IR stretch with strap 5 , 10sec hold STM with cocoa butter to right UT and right mid deltoid secondary to increased tissue tension. Cocoa butter removed with alcohol wipes afterward secondary to radiation at 2 pm    02/12/2024 Shoulder pulleys for flexion and abduction x2 min each Seated shoulder flexion with blue pball 2x10 Seated shoulder abduction with blue pball 2x10 Seated Active Assisted shoulder flexion with grasped hands x10 Seated chest stretch with hands behind head 5x5 sec hold Standing UE Ranger for flexion (in cancer gym) 2x10 Standing flexion at finger ladder 2x10 Standing scaption at finger ladder 2x10 Standing rows with yellow tband 2x10 Standing shoulder extension with yellow tband 2x10 Standing shoulder ER with yellow tband 2x10 Standing shoulder IR stretch with towel 3x20 sec Seated shoulder flexion (to 90 degrees) with 1# dumbbell 2x10 Seated shoulder rolls x10 each direction   DATE:  02/02/2024 Discussed progress with pain/goals Finger ladder x 10 Standing UE Ranger for flexion (in cancer gym) 2x10 Pulleys x 2 min flex and abd Yellow band scapular retraction, shoulder extension, ER x 10 Supine wand flexion, scaption x5  GH mobs right shoulder Gr 3 post and inferior, PROM right shoulder flexion, scaption, abduction, IR and ER.  Updated HEP with postural TB and gave yellow band for home use Also showed IR behind back  with towel and added to HEP   DATE:  01/28/2024 Shoulder pulleys for flexion and abduction x2 min each Seated shoulder flexion with blue pball 2x10 Seated shoulder abduction with blue pball 2x10 Seated Active Assisted shoulder flexion with grasped hands x10 Seated chest stretch with hands behind head 5x5 sec hold Standing UE Ranger for flexion (in cancer gym) 2x10 Standing flexion at finger ladder x10 Standing scaption at finger ladder x10 Trigger Point Dry Needling Subsequent Treatment: Instructions provided previously at initial dry needling treatment.  Patient Verbal Consent Given: Yes Education Handout Provided: Previously Provided Muscles Treated: right upper trap and right delts Electrical Stimulation Performed: No  Treatment Response/Outcome: Utilized skilled palpation to identify bony landmarks and trigger points.  Able to illicit twitch response and muscle elongation.  Soft tissue mobilization with cocoa butter following to further promote tissue elongation.        PATIENT EDUCATION:  02/18/2024: pt was educated in and performed self MLD to the right breast; written instructions given Education details: per today's note Person educated: Patient Education method: Explanation, Demonstration, Verbal cues, and Handouts Education comprehension: verbalized understanding and returned demonstration  HOME EXERCISE PROGRAM: Access Code: [email or web address] URL: [email or web address] Date: 02/12/2024 Prepared by: Reather Laurence  Exercises - Seated Scapular Retraction  - 2 x daily - 7 x weekly - 2 sets - 10 reps - Seated AAROM Shoulder Flexion  - 2 x daily - 7 x weekly - 2 sets - 10 reps - Standing Shoulder Abduction Finger Walk at Wall  - 2 x daily - 7 x weekly - 2 sets - 10 reps - Seated Chest Stretch with Hands Behind Head  - 2 x daily - 7 x weekly - 3 reps - 5 second hold - Seated Flexion Stretch with Swiss Ball  - 1 x daily - 7 x weekly - 2 sets - 10 reps - Standing Shoulder  Abduction AAROM with Swiss Ball on Table  - 1 x daily - 7 x weekly - 3 sets - 10 reps - Supine Chest Stretch with Elbows Bent  - 2 x daily - 7 x weekly - 3 reps - 5 sec hold - Supine Shoulder Flexion Extension AAROM with Dowel  - 1 x daily - 7 x weekly - 1-2 sets - 10 reps - Standing Shoulder Row with Anchored Resistance  - 1 x daily - 7 x weekly - 2 sets - 10 reps - Shoulder External Rotation and Scapular Retraction with Resistance  - 1 x daily - 7 x weekly - 2 sets - 10 reps - Shoulder extension with resistance - Neutral  - 1 x daily - 7 x weekly - 2 sets - 10 reps - Standing Shoulder Internal Rotation Stretch with Towel  - 1 x daily - 7 x weekly - 2 reps - 20 sec hold  ASSESSMENT:  CLINICAL IMPRESSION: Pt with new onset of right generalized breast pain and swelling since approx 02/12/2024. She has 1 more week of radiation, with Boost treatment next week. She has mild fibrosis proximal to the nipple and mildly enlarged pores. Redness observed from radiation. Therapist performed and then instructed pt in self MLD to the right breast. She did very well overall, requiring occasional VC and TC for pressure and direction. At completion she had a good understanding of the sequence. She will benefit from additional therapy on her right painful shoulder and  Right swollen and painful breast to address deficits remaining.    Pt will benefit from skilled therapeutic intervention to improve on the following deficits: Decreased knowledge of precautions, impaired UE functional use, pain, decreased ROM, postural dysfunction.   PT treatment/interventions: ADL/Self care home management, [id number]- PT Re-evaluation, 97110-Therapeutic exercises, 97530- Therapeutic activity, [id number]- Neuromuscular re-education, 97535- Self Care, 62130- Manual therapy, Dry Needling, and DME instructions   GOALS: Goals reviewed with patient? Yes  LONG TERM GOALS:  (STG=LTG)  GOALS Name Initial Status:  Goal status  1 Pt will  demonstrate she has regained full shoulder ROM and function post operatively compared to baselines.  Baseline:  MET  2 Pt will improve baseline shoulder AROM by at least 10 degrees into flexion, abduction, and IR  to demonstrate improved mobility  Flex; 130 Abd: 109 IR: 25 Ongoing  3 Pt will decrease QDASH to 15% to return to baseline status  22% In progress  4 Pt will be ind with final HEP  In Progress  5 Pt will be able to fasten bra behind her back 03/31/2024 NEW  6 Pt will be independent in right breast self MLD to decrease swelling 03/31/2024 NEW  7 Pts breast complaints survey will decrease to no greater than 12 to demonstrate improvements in breast swelling 03/31/2024 NEW     PLAN:  PT FREQUENCY/DURATION: 1-2x per week x 6 weeks and SOZO Q 3 months (hard visit limit of 20 for insurance)  PLAN FOR NEXT SESSION: Right breast MLD and instruct pt, ? Foam pad for fibrosis,Lt shoulder PROM/AAROM, MT for adhesive capsulitis.  Added DN to POC but would not do during radiation or at least 6 weeks post radiation or surgery.  Theraband for scapular strength  Alvira Monday, PT 02/18/24 5:30 PM    [facility] [address] [place], [place] [address] Phone # [phone] Fax [phone]

## 2024-02-19 ENCOUNTER — Encounter: Admitting: Rehabilitative and Restorative Service Providers"

## 2024-02-19 ENCOUNTER — Ambulatory Visit
Admission: RE | Admit: 2024-02-19 | Discharge: 2024-02-19 | Disposition: A | Discharge status: Home / Self Care | Payer: Managed Care, Other (non HMO) | Source: Ambulatory Visit | Attending: Radiation Oncology

## 2024-02-19 ENCOUNTER — Inpatient Hospital Stay: Attending: Hematology and Oncology | Admitting: Licensed Clinical Social Worker

## 2024-02-19 ENCOUNTER — Other Ambulatory Visit: Payer: Self-pay

## 2024-02-19 DIAGNOSIS — Z510 Encounter for antineoplastic radiation therapy: Secondary | ICD-10-CM | POA: Insufficient documentation

## 2024-02-19 DIAGNOSIS — C50411 Malignant neoplasm of upper-outer quadrant of right female breast: Secondary | ICD-10-CM | POA: Insufficient documentation

## 2024-02-19 DIAGNOSIS — Z170 Estrogen receptor positive status [ER+]: Secondary | ICD-10-CM | POA: Insufficient documentation

## 2024-02-19 LAB — RAD ONC ARIA SESSION SUMMARY
Course Elapsed Days: 21
Plan Fractions Treated to Date: 16
Plan Prescribed Dose Per Fraction: 2.66 Gy
Plan Total Fractions Prescribed: 16
Plan Total Prescribed Dose: 42.56 Gy
Reference Point Dosage Given to Date: 42.56 Gy
Reference Point Session Dosage Given: 2.66 Gy
Session Number: 16

## 2024-02-19 NOTE — Progress Notes (Signed)
 [facility] CSW Counseling Note  Patient was referred by self. Treatment type: Individual  Presenting Concerns: Patient and/or family reports the following symptoms/concerns: stress Duration of problem: 2 months; Severity of problem: moderate   Orientation:oriented to person, place, time/date, and situation.   Affect: Appropriate and Congruent Risk of harm to self or others: No plan to harm self or others  Patient and/or Family's Strengths/Protective Factors: Social connections, Social and Emotional competence, and Concrete supports in place (healthy food, safe environments, etc.)Ability for insight  Capable of independent living  Communication skills  Motivation for treatment/growth  Supportive family/friends      Goals Addressed: Patient will:  Reduce symptoms of: stress Increase healthy adjustment to current life circumstances   Progress towards Goals: Progressing   Interventions: Interventions utilized:  Solution Focused      Assessment: Patient is having a more difficult week after experiencing swelling in her breast and dealing with more fatigue and aching as radiation continues.  She has 5 more treatments schedule.  Pt's mom is also having surgery at the end of next week and pt is trying to help brother be prepared to help care for their mom.  CSW provided empathic listening and normalized feelings.  Provided brief psychoeducation on fight/flight mode and coping. Discussed strategies to help with managing stress around starting new habits (like PT exercises) and prioritizing what needs to happen now vs in a few weeks vs in the next few months and giving some grace to herself around not doing her typical levels of cooking, cleaning, etc.    Spent the end of the visit processing concerns with visual of her breast/nipple.  This connected to concerns that may arise in survivorship, including fear of recurrence and ways to cope with this normal occurrence.      Plan: Follow up  with CSW: 1 week (after last radiation) Behavioral recommendations: write out the items that are going through your mind of what you need to do. Split into priority A, B, C. For those in A that you are having trouble remembering (like PT), use an alarm or calendar for reminders.  Utilize breathing or grounding strategies (3 red, 3 not blue; 5 senses) to help move your body from fight or flight to executive functioning  Referral(s): Support group.  Added to [facility] interest list for fall       Brynnleigh Mcelwee E Saathvik Every, LCSW

## 2024-02-20 ENCOUNTER — Ambulatory Visit
Admission: RE | Admit: 2024-02-20 | Discharge: 2024-02-20 | Disposition: A | Discharge status: Home / Self Care | Source: Ambulatory Visit | Attending: Radiation Oncology | Admitting: Radiation Oncology

## 2024-02-20 ENCOUNTER — Ambulatory Visit
Admission: RE | Admit: 2024-02-20 | Discharge: 2024-02-20 | Disposition: A | Discharge status: Home / Self Care | Payer: Managed Care, Other (non HMO) | Source: Ambulatory Visit | Attending: Radiation Oncology | Admitting: Radiation Oncology

## 2024-02-20 ENCOUNTER — Other Ambulatory Visit: Payer: Self-pay

## 2024-02-20 DIAGNOSIS — C50411 Malignant neoplasm of upper-outer quadrant of right female breast: Secondary | ICD-10-CM | POA: Diagnosis not present

## 2024-02-20 LAB — RAD ONC ARIA SESSION SUMMARY
Course Elapsed Days: 22
Plan Fractions Treated to Date: 1
Plan Prescribed Dose Per Fraction: 2 Gy
Plan Total Fractions Prescribed: 4
Plan Total Prescribed Dose: 8 Gy
Reference Point Dosage Given to Date: 2 Gy
Reference Point Session Dosage Given: 2 Gy
Session Number: 17

## 2024-02-23 ENCOUNTER — Ambulatory Visit
Admission: RE | Admit: 2024-02-23 | Discharge: 2024-02-23 | Disposition: A | Discharge status: Home / Self Care | Payer: Managed Care, Other (non HMO) | Source: Ambulatory Visit | Attending: Radiation Oncology | Admitting: Radiation Oncology

## 2024-02-23 ENCOUNTER — Other Ambulatory Visit: Payer: Self-pay

## 2024-02-23 DIAGNOSIS — C50411 Malignant neoplasm of upper-outer quadrant of right female breast: Secondary | ICD-10-CM

## 2024-02-23 LAB — RAD ONC ARIA SESSION SUMMARY
Course Elapsed Days: 25
Plan Fractions Treated to Date: 2
Plan Prescribed Dose Per Fraction: 2 Gy
Plan Total Fractions Prescribed: 4
Plan Total Prescribed Dose: 8 Gy
Reference Point Dosage Given to Date: 4 Gy
Reference Point Session Dosage Given: 2 Gy
Session Number: 18

## 2024-02-23 MED ORDER — RADIAPLEXRX EX GEL
Freq: Once | CUTANEOUS | Status: AC
  Administered 2024-02-23: 1 via TOPICAL

## 2024-02-24 ENCOUNTER — Ambulatory Visit

## 2024-02-24 ENCOUNTER — Other Ambulatory Visit: Payer: Self-pay

## 2024-02-24 ENCOUNTER — Ambulatory Visit
Admission: RE | Admit: 2024-02-24 | Discharge: 2024-02-24 | Disposition: A | Discharge status: Home / Self Care | Payer: Managed Care, Other (non HMO) | Source: Ambulatory Visit | Attending: Radiation Oncology | Admitting: Radiation Oncology

## 2024-02-24 DIAGNOSIS — Z483 Aftercare following surgery for neoplasm: Secondary | ICD-10-CM

## 2024-02-24 DIAGNOSIS — M25611 Stiffness of right shoulder, not elsewhere classified: Secondary | ICD-10-CM

## 2024-02-24 DIAGNOSIS — C50411 Malignant neoplasm of upper-outer quadrant of right female breast: Secondary | ICD-10-CM

## 2024-02-24 DIAGNOSIS — I890 Lymphedema, not elsewhere classified: Secondary | ICD-10-CM

## 2024-02-24 DIAGNOSIS — G8929 Other chronic pain: Secondary | ICD-10-CM

## 2024-02-24 DIAGNOSIS — R600 Localized edema: Secondary | ICD-10-CM

## 2024-02-24 LAB — RAD ONC ARIA SESSION SUMMARY
Course Elapsed Days: 26
Plan Fractions Treated to Date: 3
Plan Prescribed Dose Per Fraction: 2 Gy
Plan Total Fractions Prescribed: 4
Plan Total Prescribed Dose: 8 Gy
Reference Point Dosage Given to Date: 6 Gy
Reference Point Session Dosage Given: 2 Gy
Session Number: 19

## 2024-02-24 NOTE — Therapy (Signed)
 OUTPATIENT PHYSICAL THERAPY BREAST CANCER POST OP TREATMENT NOTE   Patient Name: Ariel Ayala MRN: [id number] DOB:10/03/1976, 48 y.o., female Today's Date: 02/24/2024  END OF SESSION:  PT End of Session - 02/24/24 1003     Visit Number 13    Number of Visits 20    Date for PT Re-Evaluation 03/31/24    Authorization Type [employer]    Authorization - Visit Number 13    Authorization - Number of Visits 20    PT Start Time 1004    PT Stop Time 1052    PT Time Calculation (min) 48 min    Activity Tolerance Patient tolerated treatment well    Behavior During Therapy WFL for tasks assessed/performed             Past Medical History:  Diagnosis Date   Asthma    as a child   Bloating 10/23/2020   CMV (cytomegalovirus infection) (HCC)    Early satiety 10/23/2020   Generalized abdominal pain 10/23/2020   Hair loss 03/12/2023   Indigestion 10/23/2020   Invasive ductal carcinoma of breast, female, 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   Splenomegaly    on CT   Vitamin D deficiency 10/25/2020   Past Surgical History:  Procedure Laterality Date   BREAST BIOPSY Right 11/28/2023   MM RT BREAST BX W LOC DEV 1ST LESION IMAGE BX SPEC STEREO GUIDE 11/28/2023 [facility]   BREAST BIOPSY  12/23/2023   MM RT RADIOACTIVE SEED LOC MAMMO GUIDE 12/23/2023 [facility]   BREAST LUMPECTOMY WITH RADIOACTIVE SEED AND SENTINEL LYMPH NODE BIOPSY Right 12/24/2023   Procedure: RIGHT BREAST LUMPECTOMY WITH RADIOACTIVE SEED AND SENTINEL LYMPH NODE BIOPSY;  Surgeon: Abigail Miyamoto, MD;  Location: [facility] OR;  Service: General;  Laterality: Right;   DILATION AND CURETTAGE OF UTERUS     FOOT SURGERY Right    with hardware   trigger thumb Right    Patient Active Problem List   Diagnosis Date Noted   Community acquired pneumonia 01/06/2024   Genetic testing 12/22/2023   Invasive ductal carcinoma of breast, female, 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   Malignant neoplasm of upper-outer quadrant of right breast in female,  estrogen receptor positive (HCC) 12/04/2023   Iron deficiency 07/02/2023   Mixed hyperlipidemia 07/02/2023   Perimenopause 03/12/2023   Tenosynovitis of right hand 01/29/2023   Osteoarthritis of carpometacarpal (CMC) joint of thumb 01/29/2023   Adhesive capsulitis of right shoulder 08/01/2022   Body mass index (BMI) of 26.0-26.9 in adult 04/02/2022   Anxiety 04/01/2022   Primary insomnia 04/01/2022   Abnormal cervical Papanicolaou smear 11/06/2021   Genital herpes simplex 03/13/2021   Vitamin D deficiency 10/25/2020   Vegetarian diet 10/23/2020   Encounter for annual physical exam 10/23/2020   TMJ tenderness, bilateral 02/11/2020   Seasonal allergies 02/11/2020   Family history of thyroid disease in mother 05/09/2017   Elevated TSH 05/09/2017   CMV (cytomegalovirus infection) (HCC)    Splenomegaly 04/16/2017   Asthma 11/12/2016    PCP: Dr. Enid Skeens   REFERRING PROVIDER: Dr Abigail Miyamoto   REFERRING DIAG: Right breast cancer, right shoulder pain and tightness  THERAPY DIAG:  Malignant neoplasm of upper-outer quadrant of right breast in female, estrogen receptor positive (HCC)  Aftercare following surgery for neoplasm  Chronic right shoulder pain  Stiffness of right shoulder, not elsewhere classified  Lymphedema, not elsewhere classified  Localized edema  Rationale for Evaluation and Treatment: Rehabilitation  ONSET DATE: 02/14/2024  SUBJECTIVE:  SUBJECTIVE STATEMENT: My skin is doing OK. My breast is still really sore on the side. I can't tell how the swelling is. I have been doing the MLD and think I am doing well with it. My shoulder was in a lot of pain over the weekend, but it feels OK today. I have 2 more radiations left and I am really tired.  PERTINENT HISTORY:  Patient  was diagnosed on 11/28/2023 with right grade 1 invasive ductal carcinoma breast cancer. It measures 9 mm and is located in the upper outer quadrant. It is ER/PR positive and HER2 negative with a Ki67 of 5%. Rt lumpectomy and SLNB on 12/24/23 with 3 negative nodes removed.  Will be having radiation. Right shoulder adhesive capsulitis diagnosed in mid 2023.   PATIENT GOALS:  Reassess how my recovery is going related to arm function, pain, and swelling.  PAIN:  Are you having pain? Yes: NPRS scale: 4/10 Pain location: right breast especially the nipple area Pain description: achy, twinges, tight, heavy  Aggravating factors: movement, shirt rubbing the wrong way Relieving factors: cream,  PRECAUTIONS: Recent Surgery, right UE Lymphedema risk  RED FLAGS: None   ACTIVITY LEVEL / LEISURE: Back to normal activities except hard cleaning    OBJECTIVE:   PATIENT SURVEYS:  12/10/2023:  Nino Parsley Dash 15.91 12/18/2023:  Quick Dash 34.09 01/19/2024:  QUICK DASH:  25%  02/02/2023  Quick Dash 22.73   BREAST COMPLAINTS QUESTIONNAIRE( 02/18/2024) Pain:4 Heaviness:3 Swollen feeling:3 Tense Skin:3 Redness:7 Bra Print:5 Size of Pores:5 Hard feeling:8  Total:    38 /80 A Score over 9 indicates lymphedema issues in the breast   OBSERVATIONS: Wearing compression, healing incisions with glue   02/18/2024; wearing compression bra, area of fibrosis proximal to incision. Generalized right breast swelling with mildly enlarged pores inferior to nipple area. Upper lateral nipple with area of elevation/swelling that has been present since surgery. Mild redness from radiation. ? Scabbing at axillary incision vs darkened skin from radiation  POSTURE:  Rounded shoulders    LYMPHEDEMA ASSESSMENT:  UPPER EXTREMITY AROM/PROM:   A/PROM RIGHT   eval   Right A/ROM (in standing) 12/18/23 01/19/24 Right A/ROM (in standing) 01/28/24  Shoulder extension 40 42 40   Shoulder flexion 130 100 A/ROM 134 AA/ROM 130 106 112  after dry neeling  Shoulder abduction 118 and painful 110 with pain 109 pn 108 115 after dry needling  Shoulder internal rotation 30 and painful  25 pn   Shoulder external rotation 80  80                           (Blank rows = not tested)   A/PROM LEFT   eval  Shoulder extension 40  Shoulder flexion 152  Shoulder abduction 172  Shoulder internal rotation 58  Shoulder external rotation 90                          (Blank rows = not tested)   CERVICAL AROM: All within normal limits   UPPER EXTREMITY STRENGTH: Right shoulder - did not tolerate testing due to pain with any resistance; Left shoulder WNL   LANDMARK RIGHT   eval 01/19/24  10 cm proximal to olecranon process 28.5 29  Olecranon process 23.8 25  10  cm proximal to ulnar styloid process 22 21  Just proximal to ulnar styloid process 14.2 14.5  Across hand at thumb web space 16.2 16.5  At  base of 2nd digit 6 6.2  (Blank rows = not tested)   LANDMARK LEFT   eval  10 cm proximal to olecranon process 28.2  Olecranon process 23.8  10 cm proximal to ulnar styloid process 21.2  Just proximal to ulnar styloid process 14.6  Across hand at thumb web space 16.8  At base of 2nd digit 5.9  (Blank rows = not tested)  Surgery type/Date: 12/24/23 Number of lymph nodes removed: 3 Current/past treatment (chemo, radiation, hormone therapy): starting radiation 01/29/24 Other symptoms:   TODAY"S TREATMENT Pt permission and consent throughout each step of examination and treatment with modification and draping if requested when working on sensitive areas  DATE:  02/24/2024 Pulleys flexion and abd x 2 min ea to improve ROM Scapular retraction red x 12,  Sholder extension red x 5,yellow x 10 Pts skin mildly red from radiation with mild tenderness laterally Therapist performed and then reviewd with pt. MLD techniques to reduce Right breast swelling. In supine: Short neck, 5 diaphragmatic breaths, R and L axillary nodes and establishment  of interaxillary pathway, R inguinal nodes and establishment of axilloinguinal pathway, then R breast moving fluid towards pathways spending extra time in any areas of fibrosis then retracing all steps. Pt was given written and illustrated instructions.  She was advised to wear her compression bra as much as possible unless her skin is unable to tolerate it from radiation Foam pad made for axillary border of bra and placed in TG soft.  02/18/2024 Therapist performed and then instructed pt in MLD techniques to reduce Right breast swelling. In supine: Short neck, 5 diaphragmatic breaths, R and L axillary nodes and establishment of interaxillary pathway, R inguinal nodes and establishment of axilloinguinal pathway, then R breast moving fluid towards pathways spending extra time in any areas of fibrosis then retracing all steps. Pt was given written and illustrated instructions.  She was advised to wear her compression bra as much as possible unless her skin is unable to tolerate it from radiation   02/17/2024 Finger ladder flexion x 10 to warm up Single arm pec stretch x 3, 20-30 sec scapular retraction, ext, ER with yellow band 2 x 10 ea Pulleys flex and scaption x 2 min ea Shoulder ranger flexion 2 x 10 to facilitate reaching Standing shoulder flexion and scaption x 10, no resistance to facilitate reaching IR stretch with strap 5 , 10sec hold STM with cocoa butter to right UT and right mid deltoid secondary to increased tissue tension. Cocoa butter removed with alcohol wipes afterward secondary to radiation at 2 pm    02/12/2024 Shoulder pulleys for flexion and abduction x2 min each Seated shoulder flexion with blue pball 2x10 Seated shoulder abduction with blue pball 2x10 Seated Active Assisted shoulder flexion with grasped hands x10 Seated chest stretch with hands behind head 5x5 sec hold Standing UE Ranger for flexion (in cancer gym) 2x10 Standing flexion at finger ladder 2x10 Standing  scaption at finger ladder 2x10 Standing rows with yellow tband 2x10 Standing shoulder extension with yellow tband 2x10 Standing shoulder ER with yellow tband 2x10 Standing shoulder IR stretch with towel 3x20 sec Seated shoulder flexion (to 90 degrees) with 1# dumbbell 2x10 Seated shoulder rolls x10 each direction   DATE:  02/02/2024 Discussed progress with pain/goals Finger ladder x 10 Standing UE Ranger for flexion (in cancer gym) 2x10 Pulleys x 2 min flex and abd Yellow band scapular retraction, shoulder extension, ER x 10 Supine wand flexion, scaption x5  GH mobs  right shoulder Gr 3 post and inferior, PROM right shoulder flexion, scaption, abduction, IR and ER.  Updated HEP with postural TB and gave yellow band for home use Also showed IR behind back with towel and added to HEP   DATE:  01/28/2024 Shoulder pulleys for flexion and abduction x2 min each Seated shoulder flexion with blue pball 2x10 Seated shoulder abduction with blue pball 2x10 Seated Active Assisted shoulder flexion with grasped hands x10 Seated chest stretch with hands behind head 5x5 sec hold Standing UE Ranger for flexion (in cancer gym) 2x10 Standing flexion at finger ladder x10 Standing scaption at finger ladder x10 Trigger Point Dry Needling Subsequent Treatment: Instructions provided previously at initial dry needling treatment.  Patient Verbal Consent Given: Yes Education Handout Provided: Previously Provided Muscles Treated: right upper trap and right delts Electrical Stimulation Performed: No Treatment Response/Outcome: Utilized skilled palpation to identify bony landmarks and trigger points.  Able to illicit twitch response and muscle elongation.  Soft tissue mobilization with cocoa butter following to further promote tissue elongation.        PATIENT EDUCATION:  02/18/2024: pt was educated in and performed self MLD to the right breast; written instructions given Education details: per today's  note Person educated: Patient Education method: Explanation, Demonstration, Verbal cues, and Handouts Education comprehension: verbalized understanding and returned demonstration  HOME EXERCISE PROGRAM: Access Code: [email or web address] URL: [email or web address] Date: 02/12/2024 Prepared by: Reather Laurence  Exercises - Seated Scapular Retraction  - 2 x daily - 7 x weekly - 2 sets - 10 reps - Seated AAROM Shoulder Flexion  - 2 x daily - 7 x weekly - 2 sets - 10 reps - Standing Shoulder Abduction Finger Walk at Wall  - 2 x daily - 7 x weekly - 2 sets - 10 reps - Seated Chest Stretch with Hands Behind Head  - 2 x daily - 7 x weekly - 3 reps - 5 second hold - Seated Flexion Stretch with Swiss Ball  - 1 x daily - 7 x weekly - 2 sets - 10 reps - Standing Shoulder Abduction AAROM with Swiss Ball on Table  - 1 x daily - 7 x weekly - 3 sets - 10 reps - Supine Chest Stretch with Elbows Bent  - 2 x daily - 7 x weekly - 3 reps - 5 sec hold - Supine Shoulder Flexion Extension AAROM with Dowel  - 1 x daily - 7 x weekly - 1-2 sets - 10 reps - Standing Shoulder Row with Anchored Resistance  - 1 x daily - 7 x weekly - 2 sets - 10 reps - Shoulder External Rotation and Scapular Retraction with Resistance  - 1 x daily - 7 x weekly - 2 sets - 10 reps - Shoulder extension with resistance - Neutral  - 1 x daily - 7 x weekly - 2 sets - 10 reps - Standing Shoulder Internal Rotation Stretch with Towel  - 1 x daily - 7 x weekly - 2 reps - 20 sec hold  ASSESSMENT:  CLINICAL IMPRESSION:  Pt did very well return demonstrating her self right breast MLD. Required only occasional VC's for direction of stretch and to keep hand flat for greater surface area of stretch. Still having limitations in shoulder ROM with pain.  Pt will benefit from skilled therapeutic intervention to improve on the following deficits: Decreased knowledge of precautions, impaired UE functional use, pain, decreased ROM, postural dysfunction.    PT treatment/interventions: ADL/Self care home management, [phone]- PT Re-evaluation,  97110-Therapeutic exercises, 97530- Therapeutic activity, [id number]- Neuromuscular re-education, 97535- Self Care, 16109- Manual therapy, Dry Needling, and DME instructions   GOALS: Goals reviewed with patient? Yes  LONG TERM GOALS:  (STG=LTG)  GOALS Name Initial Status:  Goal status  1 Pt will demonstrate she has regained full shoulder ROM and function post operatively compared to baselines.  Baseline:  MET  2 Pt will improve baseline shoulder AROM by at least 10 degrees into flexion, abduction, and IR to demonstrate improved mobility  Flex; 130 Abd: 109 IR: 25 Ongoing  3 Pt will decrease QDASH to 15% to return to baseline status  22% In progress  4 Pt will be ind with final HEP  In Progress  5 Pt will be able to fasten bra behind her back 03/31/2024 NEW  6 Pt will be independent in right breast self MLD to decrease swelling 03/31/2024 NEW  7 Pts breast complaints survey will decrease to no greater than 12 to demonstrate improvements in breast swelling 03/31/2024 NEW     PLAN:  PT FREQUENCY/DURATION: 1-2x per week x 6 weeks and SOZO Q 3 months (hard visit limit of 20 for insurance)  PLAN FOR NEXT SESSION: review Right breast MLD and instruct pt, ? Foam pad for fibrosis,Lt shoulder PROM/AAROM, MT for adhesive capsulitis.  Added DN to POC but would not do during radiation or at least 6 weeks post radiation or surgery.  Theraband for scapular strength  Alvira Monday, PT 02/24/24 10:53 AM    [facility] [address] [place], [place] [address] Phone # [phone] Fax [phone]

## 2024-02-25 ENCOUNTER — Ambulatory Visit
Admission: RE | Admit: 2024-02-25 | Discharge: 2024-02-25 | Disposition: A | Discharge status: Home / Self Care | Payer: Managed Care, Other (non HMO) | Source: Ambulatory Visit | Attending: Radiation Oncology

## 2024-02-25 ENCOUNTER — Other Ambulatory Visit: Payer: Self-pay

## 2024-02-25 ENCOUNTER — Telehealth: Payer: Self-pay | Admitting: Hematology and Oncology

## 2024-02-25 DIAGNOSIS — C50411 Malignant neoplasm of upper-outer quadrant of right female breast: Secondary | ICD-10-CM | POA: Diagnosis not present

## 2024-02-25 LAB — RAD ONC ARIA SESSION SUMMARY
Course Elapsed Days: 27
Plan Fractions Treated to Date: 4
Plan Prescribed Dose Per Fraction: 2 Gy
Plan Total Fractions Prescribed: 4
Plan Total Prescribed Dose: 8 Gy
Reference Point Dosage Given to Date: 8 Gy
Reference Point Session Dosage Given: 2 Gy
Session Number: 20

## 2024-02-25 NOTE — Telephone Encounter (Signed)
 Line busy called multiple times

## 2024-02-26 ENCOUNTER — Inpatient Hospital Stay: Admitting: Licensed Clinical Social Worker

## 2024-02-26 NOTE — Progress Notes (Signed)
 [facility] CSW Counseling Note  Patient was referred by self. Treatment type: Individual  Presenting Concerns: Patient and/or family reports the following symptoms/concerns: stress Duration of problem: 2 months; Severity of problem: moderate   Orientation:oriented to person, place, time/date, and situation.   Affect: Appropriate and Congruent Risk of harm to self or others: No plan to harm self or others  Patient and/or Family's Strengths/Protective Factors: Social connections, Social and Emotional competence, and Concrete supports in place (healthy food, safe environments, etc.)Ability for insight  Capable of independent living  Communication skills  Motivation for treatment/growth  Supportive family/friends      Goals Addressed: Patient will:  Reduce symptoms of: stress Increase healthy adjustment to current life circumstances   Progress towards Goals: Progressing   Interventions: Interventions utilized:  Solution Focused and Strength-based      Assessment: Patient reports doing well today overall. She finished radiation yesterday and was able to have a bell ringing and dinner celebration with some friends.  Pt understands she has more recovery to go but voices less anxiety about the unknown of how she will feel each day and more ability to be flexible depending on energy levels.  Discussed "good" vs "bad" stress as pt has busy days coming up, but with activities she is looking forward to rather than treatment or forced activities.   CSW and pt briefly discussed stressors that may come up with treatment ending, including how others may expect her to "be better", mixed feelings that come with ending treatment, and continuing to manage fatigue and future side effects from medication.    Plan: Follow up with CSW: 3 weeks Behavioral recommendations: Continue to monitor energy levels and decide where and when to spend that energy. Great job increasing flexibility to feel less guilt and  anxiety around not engaging in activities that you can't or don't want to. Utilize breathing or grounding strategies (3 red, 3 not blue; 5 senses) to help move your body from fight or flight to executive functioning  Referral(s): Support group.  Added to [facility] interest list for fall       Luceil Herrin E Rutha Melgoza, LCSW

## 2024-02-26 NOTE — Radiation Completion Notes (Addendum)
  Radiation Oncology         [phone] ________________________________  Name: Ariel Ayala MRN: [id number]  Date of Service: 02/25/2024  DOB: 07-18-1976  End of Treatment Note   Diagnosis:  Stage IA, [id number], grade 1 ER/PR positive invasive ductal carcinoma of the right breast  Intent: Curative     ==========DELIVERED PLANS==========  First Treatment Date: 2024-01-29 Last Treatment Date: 2024-02-25   Plan Name: Breast_R Site: Breast, Right Technique: 3D Mode: Photon Dose Per Fraction: 2.66 Gy Prescribed Dose (Delivered / Prescribed): 42.56 Gy / 42.56 Gy Prescribed Fxs (Delivered / Prescribed): 16 / 16   Plan Name: Breast_R_Bst Site: Breast, Right Technique: 3D Mode: Photon Dose Per Fraction: 2 Gy Prescribed Dose (Delivered / Prescribed): 8 Gy / 8 Gy Prescribed Fxs (Delivered / Prescribed): 4 / 4     ==========ON TREATMENT VISIT DATES========== 2024-01-30, 2024-02-06, 2024-02-13, 2024-02-20   See weekly On Treatment Notes in Epic for details in the Media tab (listed as Progress notes on the On Treatment Visit Dates listed above). The patient tolerated radiation. She developed fatigue and anticipated skin changes in the treatment field.   The patient will receive a call in about one month from the radiation oncology department. She will continue follow up with Dr. Arno Bibles as well.      Shelvia Dick, PAC

## 2024-02-28 ENCOUNTER — Encounter: Payer: Self-pay | Admitting: Hematology and Oncology

## 2024-03-01 ENCOUNTER — Telehealth: Payer: Self-pay

## 2024-03-01 ENCOUNTER — Ambulatory Visit (HOSPITAL_COMMUNITY)
Admission: RE | Admit: 2024-03-01 | Discharge: 2024-03-01 | Disposition: A | Discharge status: Home / Self Care | Source: Ambulatory Visit | Attending: Physician Assistant | Admitting: Physician Assistant

## 2024-03-01 ENCOUNTER — Inpatient Hospital Stay (HOSPITAL_BASED_OUTPATIENT_CLINIC_OR_DEPARTMENT_OTHER): Admitting: Physician Assistant

## 2024-03-01 VITALS — BP 128/85 | HR 81 | Temp 98.7°F | Resp 16 | Wt 168.9 lb

## 2024-03-01 DIAGNOSIS — R062 Wheezing: Secondary | ICD-10-CM

## 2024-03-01 DIAGNOSIS — C50411 Malignant neoplasm of upper-outer quadrant of right female breast: Secondary | ICD-10-CM | POA: Diagnosis not present

## 2024-03-01 DIAGNOSIS — Z170 Estrogen receptor positive status [ER+]: Secondary | ICD-10-CM

## 2024-03-01 MED ORDER — METHYLPREDNISOLONE 4 MG PO TBPK: ORAL_TABLET | ORAL | 0 refills | Status: DC

## 2024-03-01 MED ORDER — ALBUTEROL SULFATE HFA 108 (90 BASE) MCG/ACT IN AERS: 2.0000 | INHALATION_SPRAY | Freq: Four times a day (QID) | RESPIRATORY_TRACT | 0 refills | Status: AC | PRN

## 2024-03-01 NOTE — Telephone Encounter (Signed)
 Patient sent myChart message about new onset of wheezing.  Patient noting that this is intermittent, but becoming increasingly worse with each boost treatment.  Patient denies any sick contacts, fever, or cough. No elevated HR at this time or dizziness.  [facility] willing to evaluate the patient today. Appointment made for 2 PM. Patient aware and agreeable to the plan.

## 2024-03-01 NOTE — Progress Notes (Signed)
 Symptom Management Consult Note [facility]    Patient Care Team: Early, Sung Amabile, NP as PCP - General (Nurse Practitioner) Ranae Pila, MD as PCP - OBGYN (Obstetrics and Gynecology) Pershing Proud, RN as Oncology Nurse Navigator Donnelly Angelica, RN as Oncology Nurse Navigator Abigail Miyamoto, MD as Consulting Physician (General Surgery) Rachel Moulds, MD as Consulting Physician (Hematology and Oncology) Dorothy Puffer, MD as Consulting Physician (Radiation Oncology)    Name / MRN / DOBCamika Ayala  [id number]  [birth year]   Date of visit: 03/01/2024   Chief Complaint/Reason for visit: wheezing     ASSESSMENT AND PLAN Patient is a 48 y.o. female with oncologic history of malignant neoplasm of upper-outer quadrant of right breast in female, estrogen receptor positive followed by Dr. Al Pimple.  I have viewed most recent oncology note and lab work.  #Malignant neoplasm of upper-outer quadrant of right breast in female, estrogen receptor positive  - Next appointment with oncologist is 03/10/24   #Wheezing - Radiation completed on 02/25/24 - History of childhood asthma. Chart review shows CAP diagnosed by PCP on 01/06/24. She also tested positive for covid after her lumpectomy. - Exam with one faint expiratory wheeze, lungs clear to auscultation. - Will prescribe medrol dosepak for potential inflammation causing symptoms from radiation. Refill for albuterol inhaler sent as well. - Chest x ray ordered to evaluate for acute infectious process that would warrant another course of antibiotics.    Strict ED precautions discussed should symptoms worsen.   HEME/ONC HISTORY Oncology History  Malignant neoplasm of upper-outer quadrant of right breast in female, estrogen receptor positive (HCC)  11/26/2023 Mammogram   Recalled from screening. Small mass within the right breast 12 o'clock position felt to correspond with the mammographically identified  distortion. Targeted ultrasound is performed, showing a small irregular hypoechoic mass right breast 12 o'clock position 2 cm from the nipple measuring 6 x 9 x 5 mm.No right axillary adenopathy.       11/29/2023 Pathology Results   IDC, grade 1, The tumor cells are negative for Her2 (0).  Estrogen Receptor:  90%, POSITIVE, MODERATE STAINING INTENSITY  Progesterone Receptor:  100%, POSITIVE, STRONG STAINING INTENSITY  Proliferation Marker Ki67:  5%    12/04/2023 Initial Diagnosis   Malignant neoplasm of upper-outer quadrant of right breast in female, estrogen receptor positive (HCC)   12/08/2023 Cancer Staging   Staging form: Breast, AJCC 8th Edition - Clinical stage from 12/08/2023: Stage IA (cT1b, cN0, cM0, G1, ER+, PR+, HER2-) - Signed by Ronny Bacon, PA-C on 12/08/2023 Stage prefix: Initial diagnosis Method of lymph node assessment: Clinical Histologic grading system: 3 grade system   12/19/2023 Genetic Testing   Negative [employer] CancerNext+RNAinsight Panel.  Report date is 12/19/2023.   The [employer] CancerNext+RNAinsight Panel includes sequencing, rearrangement analysis, and RNA analysis for the following 39 genes: APC, ATM, BAP1, BARD1, BMPR1A, BRCA1, BRCA2, BRIP1, CDH1, CDKN2A, CHEK2, FH, FLCN, MET, MLH1, MSH2, MSH6, MUTYH, NF1, NTHL1, PALB2, PMS2, PTEN, RAD51C, RAD51D, SMAD4, STK11, TP53, TSC1, TSC2, and VHL (sequencing and deletion/duplication); AXIN2, HOXB13, MBD4, MSH3, POLD1 and POLE (sequencing only); EPCAM and GREM1 (deletion/duplication only).       INTERVAL HISTORY  Discussed the use of AI scribe software for clinical note transcription with the patient, who gave verbal consent to proceed.    Ariel Ayala is a 48 y.o. female with oncologic history as above presenting to [facility] today with chief complaint of wheezing. Patient presents unaccompanied to  visit today.   She has been experiencing intermittent wheezing since receiving her first booster radiation treatment  on Friday, April 5th. Initially attributing the symptoms to anxiety, she noticed the wheezing again on Monday after returning home from work. The wheezing occurs randomly, is not associated with exertion. She describes the wheezing as a 'squeaky' sound, sometimes feeling like it originates from her throat. Her inhaler provides relief, but the wheezing occasionally resolves on its own. No cough, sore throat, or fever. She has not experienced any shortness of breath while walking, although she notes walking more slowly than usual.  She has a history of childhood asthma, which she reports having outgrown, with minimal use of her inhaler during her recent COVID-19 pneumonia. She has not started tamoxifen yet and is concerned about potential side effects. She also reports joint aches and increased fatigue, which she attributes to her recent COVID-19 pneumonia and radiation treatment. She recalls being treated with azithromycin and amoxicillin for her pneumonia, which led to gastrointestinal side effects and a yeast infection. She also received a steroid shot during her treatment.  She is currently using an albuterol inhaler and requests a new prescription. She has not used the inhaler more than once a day. She is allergic to sulfa and Macrobid and has experienced significant gastrointestinal side effects from antibiotics in the past.    ROS  All other systems are reviewed and are negative for acute change except as noted in the HPI.    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   Splenomegaly    on CT   Vitamin D deficiency 10/25/2020     Past Surgical History:  Procedure Laterality Date   BREAST BIOPSY Right 11/28/2023   MM RT BREAST BX W LOC DEV 1ST LESION IMAGE BX SPEC STEREO GUIDE 11/28/2023 [facility]   BREAST BIOPSY  12/23/2023   MM RT RADIOACTIVE SEED LOC MAMMO GUIDE 12/23/2023 [facility]   BREAST LUMPECTOMY WITH RADIOACTIVE SEED AND SENTINEL LYMPH NODE BIOPSY Right 12/24/2023   Procedure: RIGHT BREAST LUMPECTOMY WITH RADIOACTIVE SEED AND SENTINEL LYMPH NODE BIOPSY;  Surgeon: Abigail Miyamoto, MD;  Location: [facility] OR;  Service: General;  Laterality: Right;   DILATION AND CURETTAGE OF UTERUS     FOOT SURGERY Right    with hardware   trigger thumb Right     Social History   Socioeconomic History   Marital status: Single    Spouse name: Not on file   Number of children: Not on file   Years of education: Not on file   Highest education level: Not on file  Occupational History   Not on file  Tobacco Use   Smoking status: Never   Smokeless tobacco: Never  Vaping Use   Vaping status: Never Used  Substance and Sexual Activity   Alcohol use: Yes    Comment: 1-2/month   Drug use: No   Sexual activity: Not on file  Other Topics Concern   Not on file  Social History  Narrative   Not on file   Social Drivers of Health   Financial Resource Strain: Not on file  Food Insecurity: No Food Insecurity (01/15/2024)   Hunger Vital Sign    Worried About Running Out of Food in the Last Year: Never true    Ran Out of Food in the Last Year: Never true  Transportation Needs: No Transportation Needs (01/15/2024)   PRAPARE - Administrator, Civil Service (Medical): No    Lack of Transportation (Non-Medical): No  Physical Activity: Not on file  Stress: Not on file  Social Connections: Not on file  Intimate Partner Violence: Not At Risk (01/15/2024)   Humiliation, Afraid, Rape, and Kick questionnaire    Fear  of Current or Ex-Partner: No    Emotionally Abused: No    Physically Abused: No    Sexually Abused: No    Family History  Problem Relation Age of Onset   Diabetes Mother    Thyroid disease Mother    Suicidality Father    Breast cancer Maternal Grandmother        dx 25s-70s   Colon cancer Cousin        dx 19s   Skin cancer Cousin        SCC     Current Outpatient Medications:    albuterol (VENTOLIN HFA) 108 (90 Base) MCG/ACT inhaler, Inhale 2 puffs into the lungs every 6 (six) hours as needed for wheezing or shortness of breath., Disp: 8 g, Rfl: 0   methylPREDNISolone (MEDROL DOSEPAK) 4 MG TBPK tablet, Take 6 pills by mouth day 1, 5 on day 2, 4 on day 3, 3 on day 4, 2 on day 5, 1 on day 6, Disp: 21 tablet, Rfl: 0   benzonatate (TESSALON) 200 MG capsule, Take 1 capsule (200 mg total) by mouth 3 (three) times daily as needed for cough. (Patient not taking: Reported on 01/15/2024), Disp: 30 capsule, Rfl: 0   cetirizine (ZYRTEC) 10 MG tablet, Take 10 mg by mouth daily as needed for allergies., Disp: , Rfl:    Cholecalciferol (VITAMIN D-3 PO), Take 1 capsule by mouth daily., Disp: , Rfl:    Cyanocobalamin (VITAMIN B-12 PO), Take 1 tablet by mouth daily., Disp: , Rfl:    fluconazole (DIFLUCAN) 150 MG tablet, Take one tablet at the first sign of yeast. May repeat in 3 days if symptoms persist., Disp: 2 tablet, Rfl: 0   gabapentin (NEURONTIN) 100 MG capsule, Take 100 mg by mouth 3 (three) times daily., Disp: , Rfl:    ibuprofen (ADVIL) 200 MG tablet, Take 400 mg by mouth 2 (two) times daily as needed for headache or moderate pain (pain score 4-6)., Disp: , Rfl:    Melatonin 10 MG CAPS, Take 10 mg by mouth at bedtime as needed (sleep)., Disp: , Rfl:    Multiple Vitamin (MULTIVITAMIN) tablet, Take 1 tablet by mouth daily., Disp: , Rfl:    naproxen sodium (ALEVE) 220 MG tablet, Take 220 mg by mouth daily as needed (pain, headache)., Disp: , Rfl:    rosuvastatin (CRESTOR) 5 MG tablet, TAKE 1  TABLET (5 MG TOTAL) BY MOUTH DAILY., Disp: 90 tablet, Rfl: 1   sertraline (ZOLOFT) 25 MG tablet, Take 25 mg by mouth daily., Disp: , Rfl:    traMADol (ULTRAM) 50 MG tablet, Take 1 tablet (50 mg total) by mouth every 6 (six) hours as needed for moderate pain (pain score 4-6) or severe pain (pain score 7-10). (Patient not taking: Reported  on 01/06/2024), Disp: 25 tablet, Rfl: 0  PHYSICAL EXAM ECOG FS:1 - Symptomatic but completely ambulatory    Vitals:   03/01/24 1413  BP: 128/85  Pulse: 81  Resp: 16  Temp: 98.7 F (37.1 C)  TempSrc: Temporal  SpO2: 99%  Weight: 168 lb 14.4 oz (76.6 kg)   Physical Exam Vitals and nursing note reviewed.  Constitutional:      Appearance: She is not ill-appearing or toxic-appearing.  HENT:     Head: Normocephalic.  Eyes:     Conjunctiva/sclera: Conjunctivae normal.  Cardiovascular:     Rate and Rhythm: Normal rate and regular rhythm.     Pulses: Normal pulses.     Heart sounds: Normal heart sounds.  Pulmonary:     Effort: Pulmonary effort is normal. No respiratory distress.     Breath sounds: No rales.     Comments: One faint expiratory wheeze heard during exam Abdominal:     General: There is no distension.  Musculoskeletal:     Cervical back: Normal range of motion.  Skin:    General: Skin is warm and dry.  Neurological:     Mental Status: She is alert.        LABORATORY DATA I have reviewed the data as listed    Latest Ref Rng & Units 12/10/2023    1:15 PM 06/26/2023    9:28 AM 03/06/2023   11:13 AM  CBC  WBC 4.0 - 10.5 K/uL 10.2  14.1  9.1   Hemoglobin 12.0 - 15.0 g/dL 16.1  09.6  04.5   Hematocrit 36.0 - 46.0 % 37.1  39.5  37.3   Platelets 150 - 400 K/uL 441  375  417         Latest Ref Rng & Units 12/10/2023    1:15 PM 06/26/2023    9:28 AM 06/12/2023   11:37 AM  CMP  Glucose 70 - 99 mg/dL 409  811  88   BUN 6 - 20 mg/dL 12  13  10    Creatinine 0.44 - 1.00 mg/dL 9.14  7.82  9.56   Sodium 135 - 145 mmol/L 141  137  142    Potassium 3.5 - 5.1 mmol/L 3.2  4.1  4.5   Chloride 98 - 111 mmol/L 105  101  102   CO2 22 - 32 mmol/L 28  23  27    Calcium 8.9 - 10.3 mg/dL 9.5  9.2  9.4   Total Protein 6.5 - 8.1 g/dL 7.4  7.2  7.2   Total Bilirubin 0.0 - 1.2 mg/dL 0.4  0.7  0.4   Alkaline Phos 38 - 126 U/L 57  50  69   AST 15 - 41 U/L 10  16  13    ALT 0 - 44 U/L 6  12  11         RADIOGRAPHIC STUDIES (from last 24 hours if applicable) I have personally reviewed the radiological images as listed and agreed with the findings in the report. No results found.      Visit Diagnosis: 1. Malignant neoplasm of upper-outer quadrant of right breast in female, estrogen receptor positive (HCC)   2. Wheezing      Orders Placed This Encounter  Procedures   DG Chest 2 View    Standing Status:   Future    Number of Occurrences:   1    Expected Date:   03/01/2024    Expiration Date:   03/01/2025    Reason for Exam (  SYMPTOM  OR DIAGNOSIS REQUIRED):   wheezing, hx pna and covid in 12/2023 and breast radiation    Is patient pregnant?:   No    Preferred imaging location?:   [facility]    All questions were answered. The patient knows to call the clinic with any problems, questions or concerns. No barriers to learning was detected.  A total of more than 30 minutes were spent on this encounter with face-to-face time and non-face-to-face time, including preparing to see the patient, ordering tests and/or medications, counseling the patient and coordination of care as outlined above.    Thank you for allowing me to participate in the care of this patient.    Vyom Brass E  Walisiewicz, PA-C Department of Hematology/Oncology [facility] at [facility] Phone: [phone]  [phone]    03/01/2024 3:27 PM

## 2024-03-02 ENCOUNTER — Encounter: Payer: Self-pay | Admitting: Rehabilitation

## 2024-03-02 ENCOUNTER — Ambulatory Visit: Payer: Self-pay | Admitting: Rehabilitation

## 2024-03-02 DIAGNOSIS — C50411 Malignant neoplasm of upper-outer quadrant of right female breast: Secondary | ICD-10-CM | POA: Diagnosis not present

## 2024-03-02 DIAGNOSIS — Z9189 Other specified personal risk factors, not elsewhere classified: Secondary | ICD-10-CM

## 2024-03-02 DIAGNOSIS — G8929 Other chronic pain: Secondary | ICD-10-CM

## 2024-03-02 DIAGNOSIS — Z483 Aftercare following surgery for neoplasm: Secondary | ICD-10-CM

## 2024-03-02 DIAGNOSIS — Z170 Estrogen receptor positive status [ER+]: Secondary | ICD-10-CM

## 2024-03-02 DIAGNOSIS — M25611 Stiffness of right shoulder, not elsewhere classified: Secondary | ICD-10-CM

## 2024-03-02 NOTE — Therapy (Signed)
 OUTPATIENT PHYSICAL THERAPY BREAST CANCER    Patient Name: Ariel Ayala MRN: [id number] DOB:[birth year], 48 y.o., female Today's Date: 03/02/2024  END OF SESSION:  PT End of Session - 03/02/24 0907     Visit Number 14    Number of Visits 20    Date for PT Re-Evaluation 03/31/24    Authorization - Visit Number 14    Authorization - Number of Visits 20    Activity Tolerance Patient tolerated treatment well    Behavior During Therapy [facility] for tasks assessed/performed              Past Medical History:  Diagnosis Date   Asthma    as a child   Bloating 10/23/2020   CMV (cytomegalovirus infection) (HCC)    Early satiety 10/23/2020   Generalized abdominal pain 10/23/2020   Hair loss 03/12/2023   Indigestion 10/23/2020   Invasive ductal carcinoma of breast, female, 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   Splenomegaly    on CT   Vitamin D deficiency 10/25/2020   Past Surgical History:  Procedure Laterality Date   BREAST BIOPSY Right 11/28/2023   MM RT BREAST BX W LOC DEV 1ST LESION IMAGE BX SPEC STEREO GUIDE 11/28/2023 [facility]   BREAST BIOPSY  12/23/2023   MM RT RADIOACTIVE SEED LOC MAMMO GUIDE 12/23/2023 [facility]   BREAST LUMPECTOMY WITH RADIOACTIVE SEED AND SENTINEL LYMPH NODE BIOPSY Right 12/24/2023   Procedure: RIGHT BREAST LUMPECTOMY WITH RADIOACTIVE SEED AND SENTINEL LYMPH NODE BIOPSY;  Surgeon: Abigail Miyamoto, MD;  Location: [facility] OR;  Service: General;  Laterality: Right;   DILATION AND CURETTAGE OF UTERUS     FOOT SURGERY Right    with hardware   trigger thumb Right    Patient Active Problem List   Diagnosis Date Noted   Community acquired pneumonia 01/06/2024   Genetic testing 12/22/2023   Invasive ductal carcinoma of breast, female, 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   Malignant neoplasm of upper-outer quadrant of right breast in female, estrogen receptor positive (HCC) 12/04/2023   Iron deficiency 07/02/2023   Mixed hyperlipidemia 07/02/2023   Perimenopause  03/12/2023   Tenosynovitis of right hand 01/29/2023   Osteoarthritis of carpometacarpal (CMC) joint of thumb 01/29/2023   Adhesive capsulitis of right shoulder 08/01/2022   Body mass index (BMI) of 26.0-26.9 in adult 04/02/2022   Anxiety 04/01/2022   Primary insomnia 04/01/2022   Abnormal cervical Papanicolaou smear 11/06/2021   Genital herpes simplex 03/13/2021   Vitamin D deficiency 10/25/2020   Vegetarian diet 10/23/2020   Encounter for annual physical exam 10/23/2020   TMJ tenderness, bilateral 02/11/2020   Seasonal allergies 02/11/2020   Family history of thyroid disease in mother 05/09/2017   Elevated TSH 05/09/2017   CMV (cytomegalovirus infection) (HCC)    Splenomegaly 04/16/2017   Asthma 11/12/2016    PCP: Dr. Enid Skeens   REFERRING PROVIDER: Dr Abigail Miyamoto   REFERRING DIAG: Right breast cancer, right shoulder pain and tightness  THERAPY DIAG:  Malignant neoplasm of upper-outer quadrant of right breast in female, estrogen receptor positive [facility])  Aftercare following surgery for neoplasm  Chronic right shoulder pain  Stiffness of right shoulder, not elsewhere classified  At risk for lymphedema  Rationale for Evaluation and Treatment: Rehabilitation  ONSET DATE: 02/14/2024  SUBJECTIVE:  SUBJECTIVE STATEMENT: It now have an open place and a rash   PERTINENT HISTORY:  Patient was diagnosed on 11/28/2023 with right grade 1 invasive ductal carcinoma breast cancer. It measures 9 mm and is located in the upper outer quadrant. It is ER/PR positive and HER2 negative with a Ki67 of 5%. Rt lumpectomy and SLNB on 12/24/23 with 3 negative nodes removed.  Will be having radiation. Right shoulder adhesive capsulitis diagnosed in mid 2023.   PATIENT GOALS:  Reassess how my recovery is going  related to arm function, pain, and swelling.  PAIN:  Are you having pain? Yes: NPRS scale: 3/10 Pain location: right breast especially the nipple area Pain description: achy, twinges, tight, heavy  Aggravating factors: movement, shirt rubbing the wrong way Relieving factors: cream,  PRECAUTIONS: Recent Surgery, right UE Lymphedema risk  RED FLAGS: None   ACTIVITY LEVEL / LEISURE: Back to normal activities except hard cleaning    OBJECTIVE:   PATIENT SURVEYS:  12/10/2023:  Nino Parsley Dash 15.91 12/18/2023:  Quick Dash 34.09 01/19/2024:  QUICK DASH:  25%  02/02/2023  Quick Dash 22.73   BREAST COMPLAINTS QUESTIONNAIRE( 02/18/2024) Pain:4 Heaviness:3 Swollen feeling:3 Tense Skin:3 Redness:7 Bra Print:5 Size of Pores:5 Hard feeling:8  Total:    38 /80 A Score over 9 indicates lymphedema issues in the breast   OBSERVATIONS: Wearing compression, healing incisions with glue   02/18/2024; wearing compression bra, area of fibrosis proximal to incision. Generalized right breast swelling with mildly enlarged pores inferior to nipple area. Upper lateral nipple with area of elevation/swelling that has been present since surgery. Mild redness from radiation. ? Scabbing at axillary incision vs darkened skin from radiation  POSTURE:  Rounded shoulders    LYMPHEDEMA ASSESSMENT:  UPPER EXTREMITY AROM/PROM:   A/PROM RIGHT   eval   Right A/ROM (in standing) 12/18/23 01/19/24 Right A/ROM (in standing) 01/28/24  Shoulder extension 40 42 40   Shoulder flexion 130 100 A/ROM 134 AA/ROM 130 106 112 after dry neeling  Shoulder abduction 118 and painful 110 with pain 109 pn 108 115 after dry needling  Shoulder internal rotation 30 and painful  25 pn   Shoulder external rotation 80  80                           (Blank rows = not tested)   A/PROM LEFT   eval  Shoulder extension 40  Shoulder flexion 152  Shoulder abduction 172  Shoulder internal rotation 58  Shoulder external rotation 90                           (Blank rows = not tested)   CERVICAL AROM: All within normal limits   UPPER EXTREMITY STRENGTH: Right shoulder - did not tolerate testing due to pain with any resistance; Left shoulder WNL   LANDMARK RIGHT   eval 01/19/24  10 cm proximal to olecranon process 28.5 29  Olecranon process 23.8 25  10  cm proximal to ulnar styloid process 22 21  Just proximal to ulnar styloid process 14.2 14.5  Across hand at thumb web space 16.2 16.5  At base of 2nd digit 6 6.2  (Blank rows = not tested)   LANDMARK LEFT   eval  10 cm proximal to olecranon process 28.2  Olecranon process 23.8  10 cm proximal to ulnar styloid process 21.2  Just proximal to ulnar styloid process 14.6  Across hand at  thumb web space 16.8  At base of 2nd digit 5.9  (Blank rows = not tested)  Surgery type/Date: 12/24/23 Number of lymph nodes removed: 3 Current/past treatment (chemo, radiation, hormone therapy): starting radiation 01/29/24 Other symptoms:   TODAY"S TREATMENT Pt permission and consent throughout each step of examination and treatment with modification and draping if requested when working on sensitive areas  DATE: 03/02/24 Finger ladder x 10 Standing UE Ranger for flexion (in cancer gym) 2x10 and as much abduction as she could do  Pulleys x 2 min flex and abd Behind the back extension with dowel x 10, then drawing up back x 10, the adduction x 10  IR behind back with strap 10" x 3  Yellow band scapular retraction, shoulder extension, ER x 10 with cueing to try for full motion - pt feeling too weak.   Supine chest stretch with arms around 85deg 2 x 60"  Sidelying abduction AROM x 8 and ER with PT holding position x 10 Held on MLD and massage due to radiation dermatitis and open place in axilla Reminded pt she does not have to wear compression if it is uncomfortable.   02/24/2024 Pulleys flexion and abd x 2 min ea to improve ROM Scapular retraction red x 12,  Sholder extension red x  5,yellow x 10 Pts skin mildly red from radiation with mild tenderness laterally Therapist performed and then reviewd with pt. MLD techniques to reduce Right breast swelling. In supine: Short neck, 5 diaphragmatic breaths, R and L axillary nodes and establishment of interaxillary pathway, R inguinal nodes and establishment of axilloinguinal pathway, then R breast moving fluid towards pathways spending extra time in any areas of fibrosis then retracing all steps. Pt was given written and illustrated instructions.  She was advised to wear her compression bra as much as possible unless her skin is unable to tolerate it from radiation Foam pad made for axillary border of bra and placed in TG soft.  02/18/2024 Therapist performed and then instructed pt in MLD techniques to reduce Right breast swelling. In supine: Short neck, 5 diaphragmatic breaths, R and L axillary nodes and establishment of interaxillary pathway, R inguinal nodes and establishment of axilloinguinal pathway, then R breast moving fluid towards pathways spending extra time in any areas of fibrosis then retracing all steps. Pt was given written and illustrated instructions.  She was advised to wear her compression bra as much as possible unless her skin is unable to tolerate it from radiation   PATIENT EDUCATION:  02/18/2024: pt was educated in and performed self MLD to the right breast; written instructions given Education details: per today's note Person educated: Patient Education method: Explanation, Demonstration, Verbal cues, and Handouts Education comprehension: verbalized understanding and returned demonstration  HOME EXERCISE PROGRAM: Access Code: [email or web address] URL: [email or web address] Date: 02/12/2024 Prepared by: Reather Laurence  Exercises - Seated Scapular Retraction  - 2 x daily - 7 x weekly - 2 sets - 10 reps - Seated AAROM Shoulder Flexion  - 2 x daily - 7 x weekly - 2 sets - 10 reps - Standing Shoulder  Abduction Finger Walk at Wall  - 2 x daily - 7 x weekly - 2 sets - 10 reps - Seated Chest Stretch with Hands Behind Head  - 2 x daily - 7 x weekly - 3 reps - 5 second hold - Seated Flexion Stretch with Swiss Ball  - 1 x daily - 7 x weekly - 2 sets - 10 reps - Standing Shoulder  Abduction AAROM with Swiss Ball on Table  - 1 x daily - 7 x weekly - 3 sets - 10 reps - Supine Chest Stretch with Elbows Bent  - 2 x daily - 7 x weekly - 3 reps - 5 sec hold - Supine Shoulder Flexion Extension AAROM with Dowel  - 1 x daily - 7 x weekly - 1-2 sets - 10 reps - Standing Shoulder Row with Anchored Resistance  - 1 x daily - 7 x weekly - 2 sets - 10 reps - Shoulder External Rotation and Scapular Retraction with Resistance  - 1 x daily - 7 x weekly - 2 sets - 10 reps - Shoulder extension with resistance - Neutral  - 1 x daily - 7 x weekly - 2 sets - 10 reps - Standing Shoulder Internal Rotation Stretch with Towel  - 1 x daily - 7 x weekly - 2 reps - 20 sec hold  ASSESSMENT:  CLINICAL IMPRESSION: Pt is now starting to have skin effects from radiation including dermatitis and one open spot in the axilla.  She may need an extension of POC to refocus on shoulder after healing from radiation.    Pt will benefit from skilled therapeutic intervention to improve on the following deficits: Decreased knowledge of precautions, impaired UE functional use, pain, decreased ROM, postural dysfunction.   PT treatment/interventions: ADL/Self care home management, [phone]- PT Re-evaluation, 97110-Therapeutic exercises, 97530- Therapeutic activity, [id number]- Neuromuscular re-education, 97535- Self Care, [id number]- Manual therapy, Dry Needling, and DME instructions   GOALS: Goals reviewed with patient? Yes  LONG TERM GOALS:  (STG=LTG)  GOALS Name Initial Status:  Goal status  1 Pt will demonstrate she has regained full shoulder ROM and function post operatively compared to baselines.  Baseline:  MET  2 Pt will improve baseline  shoulder AROM by at least 10 degrees into flexion, abduction, and IR to demonstrate improved mobility  Flex; 130 Abd: 109 IR: 25 Ongoing  3 Pt will decrease QDASH to 15% to return to baseline status  22% In progress  4 Pt will be ind with final HEP  In Progress  5 Pt will be able to fasten bra behind her back 03/31/2024 NEW  6 Pt will be independent in right breast self MLD to decrease swelling 03/31/2024 NEW  7 Pts breast complaints survey will decrease to no greater than 12 to demonstrate improvements in breast swelling 03/31/2024 NEW     PLAN:  PT FREQUENCY/DURATION: 1-2x per week x 6 weeks and SOZO Q 3 months (hard visit limit of 20 for insurance)  PLAN FOR NEXT SESSION: review Right breast MLD and instruct pt, ? Foam pad for fibrosis,Lt shoulder PROM/AAROM, MT for adhesive capsulitis.  Added DN to POC but would not do during radiation or at least 6 weeks post radiation or surgery.  Theraband for scapular strength  Sharon December, PT 03/02/24 9:07 AM    [facility] [address] [place], [place] [address] Phone # [phone] Fax [phone]

## 2024-03-08 ENCOUNTER — Ambulatory Visit: Payer: Self-pay

## 2024-03-08 DIAGNOSIS — C50411 Malignant neoplasm of upper-outer quadrant of right female breast: Secondary | ICD-10-CM | POA: Diagnosis not present

## 2024-03-08 DIAGNOSIS — G8929 Other chronic pain: Secondary | ICD-10-CM

## 2024-03-08 DIAGNOSIS — M25611 Stiffness of right shoulder, not elsewhere classified: Secondary | ICD-10-CM

## 2024-03-08 DIAGNOSIS — Z9189 Other specified personal risk factors, not elsewhere classified: Secondary | ICD-10-CM

## 2024-03-08 DIAGNOSIS — Z483 Aftercare following surgery for neoplasm: Secondary | ICD-10-CM

## 2024-03-08 DIAGNOSIS — Z170 Estrogen receptor positive status [ER+]: Secondary | ICD-10-CM

## 2024-03-08 NOTE — Therapy (Signed)
 OUTPATIENT PHYSICAL THERAPY BREAST CANCER    Patient Name: Ariel Ayala MRN: [id number] DOB:1976-01-03, 48 y.o., female Today's Date: 03/08/2024  END OF SESSION:  PT End of Session - 03/08/24 1003     Visit Number 15    Number of Visits 20    Date for PT Re-Evaluation 03/31/24    Authorization Type [employer]    Authorization - Visit Number 15    Authorization - Number of Visits 20    PT Start Time 1005    PT Stop Time 1102    PT Time Calculation (min) 57 min    Activity Tolerance Patient tolerated treatment well    Behavior During Therapy WFL for tasks assessed/performed              Past Medical History:  Diagnosis Date   Asthma    as a child   Bloating 10/23/2020   CMV (cytomegalovirus infection) (HCC)    Early satiety 10/23/2020   Generalized abdominal pain 10/23/2020   Hair loss 03/12/2023   Indigestion 10/23/2020   Invasive ductal carcinoma of breast, female, 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   Splenomegaly    on CT   Vitamin D  deficiency 10/25/2020   Past Surgical History:  Procedure Laterality Date   BREAST BIOPSY Right 11/28/2023   MM RT BREAST BX W LOC DEV 1ST LESION IMAGE BX SPEC STEREO GUIDE 11/28/2023 [facility]   BREAST BIOPSY  12/23/2023   MM RT RADIOACTIVE SEED LOC MAMMO GUIDE 12/23/2023 [facility]   BREAST LUMPECTOMY WITH RADIOACTIVE SEED AND SENTINEL LYMPH NODE BIOPSY Right 12/24/2023   Procedure: RIGHT BREAST LUMPECTOMY WITH RADIOACTIVE SEED AND SENTINEL LYMPH NODE BIOPSY;  Surgeon: Oza Blumenthal, MD;  Location: [facility] OR;  Service: General;  Laterality: Right;   DILATION AND CURETTAGE OF UTERUS     FOOT SURGERY Right    with hardware   trigger thumb Right    Patient Active Problem List   Diagnosis Date Noted   Community acquired pneumonia 01/06/2024   Genetic testing 12/22/2023   Invasive ductal carcinoma of breast, female, 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   Malignant neoplasm of upper-outer quadrant of right breast in female, estrogen receptor  positive (HCC) 12/04/2023   Iron deficiency 07/02/2023   Mixed hyperlipidemia 07/02/2023   Perimenopause 03/12/2023   Tenosynovitis of right hand 01/29/2023   Osteoarthritis of carpometacarpal (CMC) joint of thumb 01/29/2023   Adhesive capsulitis of right shoulder 08/01/2022   Body mass index (BMI) of 26.0-26.9 in adult 04/02/2022   Anxiety 04/01/2022   Primary insomnia 04/01/2022   Abnormal cervical Papanicolaou smear 11/06/2021   Genital herpes simplex 03/13/2021   Vitamin D  deficiency 10/25/2020   Vegetarian diet 10/23/2020   Encounter for annual physical exam 10/23/2020   TMJ tenderness, bilateral 02/11/2020   Seasonal allergies 02/11/2020   Family history of thyroid disease in mother 05/09/2017   Elevated TSH 05/09/2017   CMV (cytomegalovirus infection) (HCC)    Splenomegaly 04/16/2017   Asthma 11/12/2016    PCP: Dr. Archibald Beard   REFERRING PROVIDER: Dr Oza Blumenthal   REFERRING DIAG: Right breast cancer, right shoulder pain and tightness  THERAPY DIAG:  Malignant neoplasm of upper-outer quadrant of right breast in female, estrogen receptor positive [facility])  Aftercare following surgery for neoplasm  Chronic right shoulder pain  Stiffness of right shoulder, not elsewhere classified  At risk for lymphedema  Rationale for Evaluation and Treatment: Rehabilitation  ONSET DATE: 02/14/2024  SUBJECTIVE:  SUBJECTIVE STATEMENT: The rash is gone. The open area is still there. I have been wearing a regular bra because the compression bra is uncomfortable. Swelling doesn't seem too bad. I think its a little better than it was. My shoulder is acting up a little. I feel better sitting with my arm propped. I think I should see someone about my shoulder.  PERTINENT HISTORY:  Patient was diagnosed  on 11/28/2023 with right grade 1 invasive ductal carcinoma breast cancer. It measures 9 mm and is located in the upper outer quadrant. It is ER/PR positive and HER2 negative with a Ki67 of 5%. Rt lumpectomy and SLNB on 12/24/23 with 3 negative nodes removed.  Will be having radiation. Right shoulder adhesive capsulitis diagnosed in mid 2023.   PATIENT GOALS:  Reassess how my recovery is going related to arm function, pain, and swelling.  PAIN:  Are you having pain? Yes: NPRS scale: 2-/10 Pain location: right breast especially under the arm, and posterior arm Pain description: achy, twinges, tight, heavy  Aggravating factors: movement, shirt rubbing the wrong way Relieving factors: cream,  PRECAUTIONS: Recent Surgery, right UE Lymphedema risk  RED FLAGS: None   ACTIVITY LEVEL / LEISURE: Back to normal activities except hard cleaning    OBJECTIVE:   PATIENT SURVEYS:  12/10/2023:  Hines Ludwig Dash 15.91 12/18/2023:  Quick Dash 34.09 01/19/2024:  QUICK DASH:  25%  02/02/2023  Quick Dash 22.73   BREAST COMPLAINTS QUESTIONNAIRE( 02/18/2024) Pain:4 Heaviness:3 Swollen feeling:3 Tense Skin:3 Redness:7 Bra Print:5 Size of Pores:5 Hard feeling:8  Total:    38 /80 A Score over 9 indicates lymphedema issues in the breast   OBSERVATIONS: Wearing compression, healing incisions with glue   02/18/2024; wearing compression bra, area of fibrosis proximal to incision. Generalized right breast swelling with mildly enlarged pores inferior to nipple area. Upper lateral nipple with area of elevation/swelling that has been present since surgery. Mild redness from radiation. ? Scabbing at axillary incision vs darkened skin from radiation  POSTURE:  Rounded shoulders    LYMPHEDEMA ASSESSMENT:  UPPER EXTREMITY AROM/PROM:   A/PROM RIGHT   eval   Right A/ROM (in standing) 12/18/23 01/19/24 Right A/ROM (in standing) 01/28/24  Shoulder extension 40 42 40   Shoulder flexion 130 100 A/ROM 134 AA/ROM 130  106 112 after dry neeling  Shoulder abduction 118 and painful 110 with pain 109 pn 108 115 after dry needling  Shoulder internal rotation 30 and painful  25 pn   Shoulder external rotation 80  80                           (Blank rows = not tested)   A/PROM LEFT   eval  Shoulder extension 40  Shoulder flexion 152  Shoulder abduction 172  Shoulder internal rotation 58  Shoulder external rotation 90                          (Blank rows = not tested)   CERVICAL AROM: All within normal limits   UPPER EXTREMITY STRENGTH: Right shoulder - did not tolerate testing due to pain with any resistance; Left shoulder WNL   LANDMARK RIGHT   eval 01/19/24  10 cm proximal to olecranon process 28.5 29  Olecranon process 23.8 25  10  cm proximal to ulnar styloid process 22 21  Just proximal to ulnar styloid process 14.2 14.5  Across hand at thumb web space 16.2 16.5  At base of 2nd digit 6 6.2  (Blank rows = not tested)   LANDMARK LEFT   eval  10 cm proximal to olecranon process 28.2  Olecranon process 23.8  10 cm proximal to ulnar styloid process 21.2  Just proximal to ulnar styloid process 14.6  Across hand at thumb web space 16.8  At base of 2nd digit 5.9  (Blank rows = not tested)  Surgery type/Date: 12/24/23 Number of lymph nodes removed: 3 Current/past treatment (chemo, radiation, hormone therapy): starting radiation 01/29/24 Other symptoms:   TODAY"S TREATMENT Pt permission and consent throughout each step of examination and treatment with modification and draping if requested when working on sensitive areas  DATE: 03/08/2024 Discussed progress and limited number of visits; suggested return to shoulder MD Assessed skin;rash better, under arm no longer open but skin peeling around it, breast and mild underarm swelling observed Supine wand flexion and scaption x 5 In supine: Short neck, 5 diaphragmatic breaths, L axillary nodes and establishment of interaxillary pathway, R inguinal  nodes and establishment of axilloinguinal pathway, then R breast moving fluid towards pathways spending extra time in any areas of fibrosis then retracing all steps. GH mobs right shoulder posterior, and inferior gr 3/4 PROM right shoulder flexion, scaption, abd. Sleeper stretch for IR and ER x 4 ea 5-10 sec hold    03/02/24 Finger ladder x 10 Standing UE Ranger for flexion (in cancer gym) 2x10 and as much abduction as she could do  Pulleys x 2 min flex and abd Behind the back extension with dowel x 10, then drawing up back x 10, the adduction x 10  IR behind back with strap 10" x 3  Yellow band scapular retraction, shoulder extension, ER x 10 with cueing to try for full motion - pt feeling too weak.   Supine chest stretch with arms around 85deg 2 x 60"  Sidelying abduction AROM x 8 and ER with PT holding position x 10 Held on MLD and massage due to radiation dermatitis and open place in axilla Reminded pt she does not have to wear compression if it is uncomfortable.   02/24/2024 Pulleys flexion and abd x 2 min ea to improve ROM Scapular retraction red x 12,  Sholder extension red x 5,yellow x 10 Pts skin mildly red from radiation with mild tenderness laterally Therapist performed and then reviewd with pt. MLD techniques to reduce Right breast swelling. In supine: Short neck, 5 diaphragmatic breaths, R and L axillary nodes and establishment of interaxillary pathway, R inguinal nodes and establishment of axilloinguinal pathway, then R breast moving fluid towards pathways spending extra time in any areas of fibrosis then retracing all steps. Pt was given written and illustrated instructions.  She was advised to wear her compression bra as much as possible unless her skin is unable to tolerate it from radiation Foam pad made for axillary border of bra and placed in TG soft.  02/18/2024 Therapist performed and then instructed pt in MLD techniques to reduce Right breast swelling. In supine:  Short neck, 5 diaphragmatic breaths, R and L axillary nodes and establishment of interaxillary pathway, R inguinal nodes and establishment of axilloinguinal pathway, then R breast moving fluid towards pathways spending extra time in any areas of fibrosis then retracing all steps. Pt was given written and illustrated instructions.  She was advised to wear her compression bra as much as possible unless her skin is unable to tolerate it from radiation   PATIENT EDUCATION:  02/18/2024: pt was  educated in and performed self MLD to the right breast; written instructions given Education details: per today's note Person educated: Patient Education method: Explanation, Demonstration, Verbal cues, and Handouts Education comprehension: verbalized understanding and returned demonstration  HOME EXERCISE PROGRAM: Access Code: [email or web address] URL: [email or web address] Date: 02/12/2024 Prepared by: Robyne Christen  Exercises - Seated Scapular Retraction  - 2 x daily - 7 x weekly - 2 sets - 10 reps - Seated AAROM Shoulder Flexion  - 2 x daily - 7 x weekly - 2 sets - 10 reps - Standing Shoulder Abduction Finger Walk at Wall  - 2 x daily - 7 x weekly - 2 sets - 10 reps - Seated Chest Stretch with Hands Behind Head  - 2 x daily - 7 x weekly - 3 reps - 5 second hold - Seated Flexion Stretch with Swiss Ball  - 1 x daily - 7 x weekly - 2 sets - 10 reps - Standing Shoulder Abduction AAROM with Swiss Ball on Table  - 1 x daily - 7 x weekly - 3 sets - 10 reps - Supine Chest Stretch with Elbows Bent  - 2 x daily - 7 x weekly - 3 reps - 5 sec hold - Supine Shoulder Flexion Extension AAROM with Dowel  - 1 x daily - 7 x weekly - 1-2 sets - 10 reps - Standing Shoulder Row with Anchored Resistance  - 1 x daily - 7 x weekly - 2 sets - 10 reps - Shoulder External Rotation and Scapular Retraction with Resistance  - 1 x daily - 7 x weekly - 2 sets - 10 reps - Shoulder extension with resistance - Neutral  - 1 x daily -  7 x weekly - 2 sets - 10 reps - Standing Shoulder Internal Rotation Stretch with Towel  - 1 x daily - 7 x weekly - 2 reps - 20 sec hold  ASSESSMENT:  CLINICAL IMPRESSION: Rash healed, and no open areas noted but skin peeling in axilla slightly. Shoulder pain continues to bother her and I recommended that she consider seeing an orthopedic since she is running out of PT visits. She will let me know if we need to pace her on hold. She did feel better after loosening up her shoulder today and may still be mainly a frozen shoulder she is experiencing.  Pt will benefit from skilled therapeutic intervention to improve on the following deficits: Decreased knowledge of precautions, impaired UE functional use, pain, decreased ROM, postural dysfunction.   PT treatment/interventions: ADL/Self care home management, [phone]- PT Re-evaluation, 97110-Therapeutic exercises, 97530- Therapeutic activity, [id number]- Neuromuscular re-education, 97535- Self Care, [id number]- Manual therapy, Dry Needling, and DME instructions   GOALS: Goals reviewed with patient? Yes  LONG TERM GOALS:  (STG=LTG)  GOALS Name Initial Status:  Goal status  1 Pt will demonstrate she has regained full shoulder ROM and function post operatively compared to baselines.  Baseline:  MET  2 Pt will improve baseline shoulder AROM by at least 10 degrees into flexion, abduction, and IR to demonstrate improved mobility  Flex; 130 Abd: 109 IR: 25 Ongoing  3 Pt will decrease QDASH to 15% to return to baseline status  22% In progress  4 Pt will be ind with final HEP  In Progress  5 Pt will be able to fasten bra behind her back 03/31/2024 NEW  6 Pt will be independent in right breast self MLD to decrease swelling 03/31/2024 NEW  7 Pts breast complaints survey will decrease to no  greater than 12 to demonstrate improvements in breast swelling 03/31/2024 NEW     PLAN:  PT FREQUENCY/DURATION: 1-2x per week x 6 weeks and SOZO Q 3 months (hard visit limit of  20 for insurance)  PLAN FOR NEXT SESSION: review Right breast MLD and instruct pt, ? Foam pad for fibrosis,Lt shoulder PROM/AAROM, MT for adhesive capsulitis.  Added DN to POC but would not do during radiation or at least 6 weeks post radiation or surgery.  Theraband for scapular strength  Sharon December, PT 03/08/24 11:05 AM    [facility] [address], [address] [place], [place] [address] Phone # [phone] Fax [phone]

## 2024-03-09 ENCOUNTER — Ambulatory Visit: Payer: Managed Care, Other (non HMO) | Admitting: Nurse Practitioner

## 2024-03-09 ENCOUNTER — Telehealth: Payer: Self-pay

## 2024-03-09 ENCOUNTER — Encounter: Payer: Self-pay | Admitting: Nurse Practitioner

## 2024-03-09 VITALS — BP 122/70 | HR 98 | Ht 61.0 in | Wt 167.2 lb

## 2024-03-09 DIAGNOSIS — E611 Iron deficiency: Secondary | ICD-10-CM | POA: Diagnosis not present

## 2024-03-09 DIAGNOSIS — Z170 Estrogen receptor positive status [ER+]: Secondary | ICD-10-CM

## 2024-03-09 DIAGNOSIS — F419 Anxiety disorder, unspecified: Secondary | ICD-10-CM

## 2024-03-09 DIAGNOSIS — Z789 Other specified health status: Secondary | ICD-10-CM

## 2024-03-09 DIAGNOSIS — Z1321 Encounter for screening for nutritional disorder: Secondary | ICD-10-CM

## 2024-03-09 DIAGNOSIS — E559 Vitamin D deficiency, unspecified: Secondary | ICD-10-CM

## 2024-03-09 DIAGNOSIS — N951 Menopausal and female climacteric states: Secondary | ICD-10-CM | POA: Diagnosis not present

## 2024-03-09 DIAGNOSIS — C50411 Malignant neoplasm of upper-outer quadrant of right female breast: Secondary | ICD-10-CM

## 2024-03-09 DIAGNOSIS — J4520 Mild intermittent asthma, uncomplicated: Secondary | ICD-10-CM

## 2024-03-09 DIAGNOSIS — Z0000 Encounter for general adult medical examination without abnormal findings: Secondary | ICD-10-CM

## 2024-03-09 DIAGNOSIS — J302 Other seasonal allergic rhinitis: Secondary | ICD-10-CM

## 2024-03-09 DIAGNOSIS — Z1211 Encounter for screening for malignant neoplasm of colon: Secondary | ICD-10-CM

## 2024-03-09 DIAGNOSIS — C50911 Malignant neoplasm of unspecified site of right female breast: Secondary | ICD-10-CM

## 2024-03-09 DIAGNOSIS — E782 Mixed hyperlipidemia: Secondary | ICD-10-CM

## 2024-03-09 DIAGNOSIS — M7501 Adhesive capsulitis of right shoulder: Secondary | ICD-10-CM

## 2024-03-09 DIAGNOSIS — R7989 Other specified abnormal findings of blood chemistry: Secondary | ICD-10-CM

## 2024-03-09 LAB — LIPID PANEL

## 2024-03-09 NOTE — Telephone Encounter (Signed)
 Number not in service.

## 2024-03-09 NOTE — Progress Notes (Signed)
 Dell Fennel, DNP, AGNP-c [facility] [address] [place], [place] [address] Main Office [phone]  BP 122/70   Pulse 98   Ht 5\' 1"  (1.549 m)   Wt 167 lb 3.2 oz (75.8 kg)   BMI 31.59 kg/m    Subjective:    Patient ID: Ariel Ayala, female    DOB: [birth year], 48 y.o.   MRN: [id number]  HPI: Ariel Ayala is a 48 y.o. female presenting on 03/09/2024 for comprehensive medical examination.   History of Present Illness Ariel Ayala is a 48 year old female here for CPE.   She completed radiation therapy for breast cancern on February 25, 2024, and is currently experiencing fatigue and skin discoloration. She is concerned about starting tamoxifen  due to potential side effects, as she often experiences side effects from medications. No chemotherapy was administered. She experienced wheezing after receiving boost radiation directed at her chest, which resolved after a course of steroids.  She has been experiencing issues with her right shoulder for a couple of years and plans to see a different orthopedic specialist and continue physical therapy. She also reports swelling in her arm, consistent with lymphedema.  She reports night sweats, which she attributes to perimenopause, and has noticed her menstrual cycles are becoming shorter, with periods occurring every 24 days instead of 28.  She has gained weight over the past few months and feels too tired to exercise. She describes feeling 'like 48 years old' with generalized soreness and stiffness, particularly in her knees when descending stairs.  She has a history of COVID-19 and pneumonia, which were followed by a series of complications including a yeast infection and hemorrhoids.  She is currently on a vegetarian diet and has been taking Zoloft, which she feels has helped stabilize her emotions. She is on a very low dose and is considering whether to increase it.  She has not been consistent with her  cholesterol medication and is interested in checking her iron , vitamin D , and cholesterol levels. No changes in vision or hearing, difficulty swallowing, or changes in bowel or bladder habits. Normal drainage and congestion due to allergies, but no current wheezing. No swelling in her feet or ankles.  Pertinent items are noted in HPI.   Most Recent Depression Screen:     03/09/2024    9:27 AM 12/10/2023    1:43 PM 03/06/2023   10:21 AM 10/23/2020    1:42 PM  Depression screen PHQ 2/9  Decreased Interest 0 0 0 0  Down, Depressed, Hopeless 0 1 0 0  PHQ - 2 Score 0 1 0 0   Most Recent Anxiety Screen:      No data to display         Most Recent Fall Screen:    03/09/2024    9:26 AM 03/06/2023   10:20 AM 04/04/2021   10:30 AM 10/23/2020    1:41 PM  Fall Risk   Falls in the past year? 0 0 1 1  Number falls in past yr: 0 0 0 0  Injury with Fall? 0 0 0 0  Risk for fall due to : No Fall Risks No Fall Risks Other (Comment)   Risk for fall due to: Comment   tripped and fell in parking lot   Follow up Falls evaluation completed Falls evaluation completed Falls evaluation completed     Past medical history, surgical history, medications, allergies, family history and social history reviewed with patient today and changes made to  appropriate areas of the chart.  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   cetirizine (ZYRTEC) 10 MG tablet Take 10 mg by mouth daily as needed for allergies.   Cholecalciferol (VITAMIN D -3 PO) Take 1 capsule by mouth daily.   Cyanocobalamin (VITAMIN B-12 PO) Take 1 tablet by mouth daily.   ibuprofen (ADVIL) 200 MG tablet Take 400 mg by mouth 2 (two) times daily as needed for headache or moderate pain (pain score 4-6).   Melatonin 10 MG CAPS Take 10 mg by mouth at bedtime as needed (sleep).   Multiple Vitamin (MULTIVITAMIN) tablet Take 1 tablet by mouth daily.   naproxen sodium (ALEVE) 220 MG tablet Take 220 mg by mouth daily as needed (pain, headache).   sertraline (ZOLOFT) 25 MG tablet Take 25 mg by mouth daily.   rosuvastatin  (CRESTOR ) 5 MG tablet TAKE 1 TABLET (5 MG TOTAL) BY MOUTH DAILY. (Patient not taking: Reported on 03/09/2024)   No current facility-administered medications on file prior to visit.   Surgical History:  Past Surgical History:  Procedure Laterality Date   BREAST BIOPSY Right 11/28/2023   MM RT BREAST BX W LOC DEV 1ST LESION IMAGE BX SPEC STEREO GUIDE 11/28/2023 [facility]   BREAST BIOPSY  12/23/2023   MM RT RADIOACTIVE SEED LOC MAMMO GUIDE 12/23/2023 [facility]   BREAST LUMPECTOMY WITH RADIOACTIVE SEED AND SENTINEL LYMPH NODE BIOPSY Right 12/24/2023   Procedure: RIGHT BREAST LUMPECTOMY WITH RADIOACTIVE SEED AND SENTINEL LYMPH NODE BIOPSY;  Surgeon: Oza Blumenthal, MD;  Location: [facility] OR;  Service: General;  Laterality: Right;   DILATION AND CURETTAGE OF UTERUS     FOOT SURGERY Right    with hardware   trigger thumb Right    Allergies:  Allergies  Allergen Reactions   Sulfa Antibiotics Itching and Swelling    Throat swelling   Macrobid  [Nitrofurantoin] Nausea And Vomiting and Other (See Comments)    Abdominal pain   Family History:  Family History  Problem Relation Age of Onset   Diabetes Mother    Thyroid disease Mother    Suicidality Father    Breast cancer Maternal Grandmother        dx 6s-70s   Colon cancer Cousin        dx 35s   Skin cancer Cousin        SCC       Objective:    BP 122/70   Pulse 98   Ht 5\' 1"  (1.549 m)   Wt 167 lb 3.2 oz (75.8 kg)   BMI 31.59 kg/m  Wt Readings from Last 3 Encounters:  03/10/24 167 lb 1.6 oz (75.8 kg)  03/09/24 167 lb 3.2 oz (75.8 kg)  03/01/24 168 lb 14.4 oz (76.6 kg)    Physical Exam Vitals and nursing note reviewed.  Constitutional:      General: She is not in acute distress.    Appearance: Normal appearance.  HENT:     Head: Normocephalic and atraumatic.     Right Ear: Hearing, ear canal and external ear normal. A middle ear effusion is present.     Left Ear: Hearing, ear canal and external ear normal. A middle ear effusion is present.     Ears:     Comments: Clear effusion    Nose: Nose normal.     Right Sinus: No maxillary sinus tenderness or frontal sinus tenderness.     Left Sinus: No maxillary sinus tenderness or frontal sinus tenderness.     Mouth/Throat:     Lips: Pink.     Mouth: Mucous membranes are moist.     Pharynx: Oropharynx is clear.  Eyes:     General: Lids are normal. Vision grossly intact.     Extraocular Movements: Extraocular movements intact.     Conjunctiva/sclera: Conjunctivae normal.     Pupils: Pupils are equal, round, and reactive to light.     Funduscopic exam:    Right eye: Red reflex present.        Left eye: Red reflex present.    Visual Fields: Right eye visual fields normal and left eye visual fields normal.  Neck:     Thyroid: No thyromegaly.     Vascular: No carotid bruit.  Cardiovascular:     Rate and Rhythm: Normal rate and regular rhythm.     Chest Wall: PMI is not displaced.     Pulses: Normal pulses.           Dorsalis pedis pulses are 2+ on the right side and 2+ on the left side.       Posterior tibial pulses are 2+ on the right side and 2+ on the left side.     Heart sounds: Normal heart sounds. No murmur heard. Pulmonary:     Effort: Pulmonary effort is normal. No respiratory distress.     Breath sounds: Normal breath sounds.  Abdominal:     General: Abdomen is flat. Bowel sounds are normal. There is no distension.     Palpations: Abdomen is soft. There is no hepatomegaly, splenomegaly or mass.     Tenderness: There is no abdominal tenderness. There is no right CVA tenderness, left CVA tenderness, guarding or rebound.  Musculoskeletal:        General: Normal range of motion.     Cervical back: Full passive range of motion without pain, normal range of motion and neck supple. No tenderness.     Right lower leg: No edema.     Left lower leg: No edema.  Feet:     Left foot:     Toenail Condition: Left toenails are normal.  Lymphadenopathy:     Cervical: No cervical adenopathy.     Upper Body:     Right upper body: No supraclavicular adenopathy.     Left upper body: No supraclavicular adenopathy.  Skin:    General: Skin is warm and dry.     Capillary Refill: Capillary refill takes less than 2 seconds.     Nails: There is no clubbing.  Neurological:     General: No focal deficit  present.     Mental Status: She is alert and oriented to person, place, and time.     GCS: GCS eye subscore is 4. GCS verbal subscore is 5. GCS motor subscore is 6.     Sensory: Sensation is intact.     Motor: Motor function is intact.     Coordination: Coordination is intact.     Gait: Gait is intact.     Deep Tendon Reflexes: Reflexes are normal and symmetric.  Psychiatric:        Attention and Perception: Attention normal.        Mood and Affect: Mood normal.        Speech: Speech normal.        Behavior: Behavior normal. Behavior is cooperative.        Thought Content: Thought content normal.         Cognition and Memory: Cognition and memory normal.        Judgment: Judgment normal.      Results for orders placed or performed in visit on 03/09/24  CBC with Differential/Platelet   Collection Time: 03/09/24 10:12 AM  Result Value Ref Range   WBC 8.7 3.4 - 10.8 x10E3/uL   RBC 4.65 3.77 - 5.28 x10E6/uL   Hemoglobin 11.7 11.1 - 15.9 g/dL   Hematocrit 16.1 09.6 - 46.6 %   MCV 79 79 - 97 fL   MCH 25.2 (L) 26.6 - 33.0 pg   MCHC 31.8 31.5 - 35.7 g/dL   RDW 04.5 40.9 - 81.1 %   Platelets 382 150 - 450 x10E3/uL   Neutrophils 72 Not Estab. %   Lymphs 17 Not Estab. %   Monocytes 6 Not Estab. %   Eos 3 Not Estab. %   Basos 1 Not Estab. %   Neutrophils Absolute 6.3 1.4 - 7.0 x10E3/uL   Lymphocytes Absolute 1.5 0.7 - 3.1 x10E3/uL   Monocytes Absolute 0.5 0.1 - 0.9 x10E3/uL   EOS (ABSOLUTE) 0.3 0.0 - 0.4 x10E3/uL   Basophils Absolute 0.0 0.0 - 0.2 x10E3/uL   Immature Granulocytes 1 Not Estab. %   Immature Grans (Abs) 0.1 0.0 - 0.1 x10E3/uL  CMP14+EGFR   Collection Time: 03/09/24 10:12 AM  Result Value Ref Range   Glucose 89 70 - 99 mg/dL   BUN 13 6 - 24 mg/dL   Creatinine, Ser 9.14 0.57 - 1.00 mg/dL   eGFR 782 >95 AO/ZHY/8.65   BUN/Creatinine Ratio 19 9 - 23   Sodium 140 134 - 144 mmol/L   Potassium 4.0 3.5 - 5.2 mmol/L   Chloride 102 96 - 106 mmol/L   CO2 20 20 - 29 mmol/L   Calcium  9.2 8.7 - 10.2 mg/dL   Total Protein 7.3 6.0 - 8.5 g/dL   Albumin 4.3 3.9 - 4.9 g/dL   Globulin, Total 3.0 1.5 - 4.5 g/dL   Bilirubin Total 0.4 0.0 - 1.2 mg/dL   Alkaline Phosphatase 79 44 - 121 IU/L   AST 15 0 - 40 IU/L   ALT 15 0 - 32 IU/L  Hemoglobin A1c   Collection Time: 03/09/24 10:12 AM  Result Value Ref Range   Hgb A1c MFr Bld 5.4 4.8 - 5.6 %   Est. average glucose Bld gHb Est-mCnc 108 mg/dL  Lipid panel   Collection Time: 03/09/24 10:12 AM  Result Value Ref Range   Cholesterol, Total 256 (H) 100 - 199 mg/dL   Triglycerides 784 (H) 0 - 149 mg/dL   HDL 61 >69 mg/dL  VLDL  Cholesterol Cal 43 (H) 5 - 40 mg/dL   LDL Chol Calc (NIH) 409 (H) 0 - 99 mg/dL   Chol/HDL Ratio 4.2 0.0 - 4.4 ratio  TSH   Collection Time: 03/09/24 10:12 AM  Result Value Ref Range   TSH 2.410 0.450 - 4.500 uIU/mL  Vitamin B12   Collection Time: 03/09/24 10:12 AM  Result Value Ref Range   Vitamin B-12 823 232 - 1,245 pg/mL  VITAMIN D  25 Hydroxy (Vit-D Deficiency, Fractures)   Collection Time: 03/09/24 10:12 AM  Result Value Ref Range   Vit D, 25-Hydroxy 31.6 30.0 - 100.0 ng/mL  Iron , TIBC and Ferritin Panel   Collection Time: 03/09/24 10:12 AM  Result Value Ref Range   Total Iron  Binding Capacity 424 250 - 450 ug/dL   UIBC 811 914 - 782 ug/dL   Iron  44 27 - 159 ug/dL   Iron  Saturation 10 (L) 15 - 55 %   Ferritin 9 (L) 15 - 150 ng/mL       Assessment & Plan:   Problem List Items Addressed This Visit     Asthma   Asthma symptoms resolved following a recent episode of wheezing post-radiation therapy. Wheezing was managed with steroids, which were effective.       Elevated TSH   Repeat labs today for evaluation.       Relevant Orders   TSH (Completed)   Seasonal allergies   Allergic rhinitis with symptoms of sinus congestion and drainage. Zyrtec is being used as needed, though adherence is inconsistent.      Vegetarian diet   Labs for nutritional status ordered today. High protein encouraged.       Encounter for annual physical exam - Primary   CPE completed today. Review of HM activities and recommendations discussed and provided on AVS. Anticipatory guidance, diet, and exercise recommendations provided. Medications, allergies, and hx reviewed and updated as necessary. Orders placed as listed below.  Plan: - Labs ordered. Will make changes as necessary based on results.  - I will review these results and send recommendations via MyChart or a telephone call.  - F/U with CPE in 1 year or sooner for acute/chronic health needs as directed.        Vitamin D   deficiency   Repeat labs      Relevant Orders   CBC with Differential/Platelet (Completed)   CMP14+EGFR (Completed)   VITAMIN D  25 Hydroxy (Vit-D Deficiency, Fractures) (Completed)   Anxiety   Well controlled with sertraline with no concerning symptoms reported. Will continue to monitor.       Adhesive capsulitis of right shoulder   Chronic right shoulder pain with ongoing management challenges. Previous treatments included physical therapy and injections. Referral to a new orthopedic specialist is planned for further evaluation.       Perimenopause   Changes in menses associated with perimenopause. Mood is stable.       Iron  deficiency   Repeat labs for monitoring.       Relevant Orders   CBC with Differential/Platelet (Completed)   Iron , TIBC and Ferritin Panel (Completed)   Mixed hyperlipidemia   Not currently taking rosuvastatin  for management. Labs pending today. Recommend restart for CVD protection. Consider atorvastatin or lovastatin if unable to tolerate strength      Relevant Orders   Hemoglobin A1c (Completed)   Lipid panel (Completed)   Malignant neoplasm of upper-outer quadrant of right breast in female, estrogen receptor positive (HCC)   Post-radiation therapy completed on  February 25, 2024. Experiencing fatigue and concerns about starting tamoxifen  due to potential side effects. Engaged in shared decision-making regarding tamoxifen , with reassurance about its tolerability and long-term use. Tamoxifen  is generally well-tolerated, though some patients experience side effects. 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 Ongoing management with physical therapy is planned. - Start tamoxifen  as planned after consultation with oncologist.      Invasive ductal carcinoma of breast, female, right (HCC)   Post-radiation therapy completed on February 25, 2024. Experiencing fatigue and concerns about starting tamoxifen  due to potential side effects. Engaged  in shared decision-making regarding tamoxifen , with reassurance about its tolerability and long-term use. Tamoxifen  is generally well-tolerated, though some patients experience side effects.  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 Ongoing management with physical therapy is planned. - Start tamoxifen  as planned after consultation with oncologist.       Screening for colon cancer   Relevant Orders   Ambulatory referral to Gastroenterology   Other Visit Diagnoses       Encounter for special screening examination for nutritional disorder       Relevant Orders   CMP14+EGFR (Completed)   Vitamin B12 (Completed)   VITAMIN D  25 Hydroxy (Vit-D Deficiency, Fractures) (Completed)         Follow up plan: Return in about 1 year (around 03/09/2025) for CPE.  NEXT PREVENTATIVE PHYSICAL DUE IN 1 YEAR.  PATIENT COUNSELING PROVIDED FOR ALL ADULT PATIENTS: A well balanced diet low in saturated fats, cholesterol, and moderation in carbohydrates.  This can be as simple as monitoring portion sizes and cutting back on sugary beverages such as soda and juice to start with.    Daily water consumption of at least 64 ounces.  Physical activity at least 180 minutes per week.  If just starting out, start 10 minutes a day and work your way up.   This can be as simple as taking the stairs instead of the elevator and walking 2-3 laps around the office  purposefully every day.   STD protection, partner selection, and regular testing if high risk.  Limited consumption of alcoholic beverages if alcohol is consumed. For men, I recommend no more than 14 alcoholic beverages per week, spread out throughout the week (max 2 per day). Avoid "binge" drinking or consuming large quantities of alcohol in one setting.  Please let me know if you feel you may need help with reduction or quitting alcohol consumption.   Avoidance of nicotine, if used. Please let me know if you feel you may  need help with reduction or quitting nicotine use.   Daily mental health attention. This can be in the form of 5 minute daily meditation, prayer, journaling, yoga, reflection, etc.  Purposeful attention to your emotions and mental state can significantly improve your overall wellbeing  and  Health.  Please know that I am here to help you with all of your health care goals and am happy to work with you to find a solution that works best for you.  The greatest advice I have received with any changes in life are to take it one step at a time, that even means if all you can focus on is the next 60 seconds, then do that and celebrate your victories.  With any changes in life, you will have set backs, and that is OK. The important thing to remember is, if you have a set back, it is not a failure, it is an  opportunity to try again! Screening Testing Mammogram Every 1 -2 years based on history and risk factors Starting at age 77 Pap Smear Ages 21-39 every 3 years Ages 42-65 every 5 years with HPV testing More frequent testing may be required based on results and history Colon Cancer Screening Every 1-10 years based on test performed, risk factors, and history Starting at age 65 Bone Density Screening Every 2-10 years based on history Starting at age 53 for women Recommendations for men differ based on medication usage, history, and risk factors AAA Screening One time ultrasound Men 60-62 years old who have every smoked Lung Cancer Screening Low Dose Lung CT every 12 months Age 78-80 years with a 30 pack-year smoking history who still smoke or who have quit within the last 15 years   Screening Labs Routine  Labs: Complete Blood Count (CBC), Complete Metabolic Panel (CMP), Cholesterol (Lipid Panel) Every 6-12 months based on history and medications May be recommended more frequently based on current conditions or previous results Hemoglobin A1c Lab Every 3-12 months based on history and  previous results Starting at age 11 or earlier with diagnosis of diabetes, high cholesterol, BMI >26, and/or risk factors Frequent monitoring for patients with diabetes to ensure blood sugar control Thyroid Panel (TSH) Every 6 months based on history, symptoms, and risk factors May be repeated more often if on medication HIV One time testing for all patients 49 and older May be repeated more frequently for patients with increased risk factors or exposure Hepatitis C One time testing for all patients 46 and older May be repeated more frequently for patients with increased risk factors or exposure Gonorrhea, Chlamydia Every 12 months for all sexually active persons 13-24 years Additional monitoring may be recommended for those who are considered high risk or who have symptoms Every 12 months for any woman on birth control, regardless of sexual activity PSA Men 32-75 years old with risk factors Additional screening may be recommended from age 84-69 based on risk factors, symptoms, and history  Vaccine Recommendations Tetanus Booster All adults every 10 years Flu Vaccine All patients 6 months and older every year COVID Vaccine All patients 12 years and older Initial dosing with booster May recommend additional booster based on age and health history HPV Vaccine 2 doses all patients age 84-26 Dosing may be considered for patients over 26 Shingles Vaccine (Shingrix) 2 doses all adults 55 years and older Pneumonia (Pneumovax 70) All adults 65 years and older May recommend earlier dosing based on health history One year apart from Prevnar 27 Pneumonia (Prevnar 50) All adults 65 years and older Dosed 1 year after Pneumovax 23 Pneumonia (Prevnar 20) One time alternative to the two dosing of 13 and 23 For all adults with initial dose of 23, 20 is recommended 1 year later For all adults with initial dose of 13, 23 is still recommended as second option 1 year  later

## 2024-03-09 NOTE — Patient Instructions (Signed)
 It was great to see you today!  Your exam was great! I didn't have any concerns. I will let you know what your labs show and if we need to change anything, I will let you know.   If you decide to increase the sertraline, let me know and I will be happy to send in the increased dose. I do recommend staying on this for a few months after starting tamoxifen  to help reduce the emotional challenges with that.

## 2024-03-10 ENCOUNTER — Inpatient Hospital Stay (HOSPITAL_BASED_OUTPATIENT_CLINIC_OR_DEPARTMENT_OTHER): Admitting: Hematology and Oncology

## 2024-03-10 ENCOUNTER — Ambulatory Visit: Admitting: Hematology and Oncology

## 2024-03-10 ENCOUNTER — Encounter: Payer: Self-pay | Admitting: Hematology and Oncology

## 2024-03-10 VITALS — BP 127/84 | HR 80 | Temp 98.2°F | Resp 18 | Ht 61.0 in | Wt 167.1 lb

## 2024-03-10 DIAGNOSIS — C50411 Malignant neoplasm of upper-outer quadrant of right female breast: Secondary | ICD-10-CM

## 2024-03-10 DIAGNOSIS — Z170 Estrogen receptor positive status [ER+]: Secondary | ICD-10-CM | POA: Diagnosis not present

## 2024-03-10 LAB — CBC WITH DIFFERENTIAL/PLATELET
Basophils Absolute: 0 10*3/uL (ref 0.0–0.2)
Basos: 1 %
EOS (ABSOLUTE): 0.3 10*3/uL (ref 0.0–0.4)
Eos: 3 %
Hematocrit: 36.8 % (ref 34.0–46.6)
Hemoglobin: 11.7 g/dL (ref 11.1–15.9)
Immature Grans (Abs): 0.1 10*3/uL (ref 0.0–0.1)
Immature Granulocytes: 1 %
Lymphocytes Absolute: 1.5 10*3/uL (ref 0.7–3.1)
Lymphs: 17 %
MCH: 25.2 pg — ABNORMAL LOW (ref 26.6–33.0)
MCHC: 31.8 g/dL (ref 31.5–35.7)
MCV: 79 fL (ref 79–97)
Monocytes Absolute: 0.5 10*3/uL (ref 0.1–0.9)
Monocytes: 6 %
Neutrophils Absolute: 6.3 10*3/uL (ref 1.4–7.0)
Neutrophils: 72 %
Platelets: 382 10*3/uL (ref 150–450)
RBC: 4.65 x10E6/uL (ref 3.77–5.28)
RDW: 14.7 % (ref 11.7–15.4)
WBC: 8.7 10*3/uL (ref 3.4–10.8)

## 2024-03-10 LAB — LIPID PANEL
Cholesterol, Total: 256 mg/dL — ABNORMAL HIGH (ref 100–199)
HDL: 61 mg/dL (ref >39)
LDL CALC COMMENT:: 4.2 ratio (ref 0.0–4.4)
LDL Chol Calc (NIH): 152 mg/dL — ABNORMAL HIGH (ref 0–99)
Triglycerides: 236 mg/dL — ABNORMAL HIGH (ref 0–149)
VLDL Cholesterol Cal: 43 mg/dL — ABNORMAL HIGH (ref 5–40)

## 2024-03-10 LAB — IRON,TIBC AND FERRITIN PANEL
Ferritin: 9 ng/mL — ABNORMAL LOW (ref 15–150)
Iron Saturation: 10 % — ABNORMAL LOW (ref 15–55)
Iron: 44 ug/dL (ref 27–159)
Total Iron Binding Capacity: 424 ug/dL (ref 250–450)
UIBC: 380 ug/dL (ref 131–425)

## 2024-03-10 LAB — CMP14+EGFR
ALT: 15 IU/L (ref 0–32)
AST: 15 IU/L (ref 0–40)
Albumin: 4.3 g/dL (ref 3.9–4.9)
Alkaline Phosphatase: 79 IU/L (ref 44–121)
BUN/Creatinine Ratio: 19 (ref 9–23)
BUN: 13 mg/dL (ref 6–24)
Bilirubin Total: 0.4 mg/dL (ref 0.0–1.2)
CO2: 20 mmol/L (ref 20–29)
Calcium: 9.2 mg/dL (ref 8.7–10.2)
Chloride: 102 mmol/L (ref 96–106)
Creatinine, Ser: 0.69 mg/dL (ref 0.57–1.00)
Globulin, Total: 3 g/dL (ref 1.5–4.5)
Glucose: 89 mg/dL (ref 70–99)
Potassium: 4 mmol/L (ref 3.5–5.2)
Sodium: 140 mmol/L (ref 134–144)
Total Protein: 7.3 g/dL (ref 6.0–8.5)
eGFR: 107 mL/min/{1.73_m2} (ref >59)

## 2024-03-10 LAB — TSH: TSH: 2.41 u[IU]/mL (ref 0.450–4.500)

## 2024-03-10 LAB — VITAMIN B12: Vitamin B-12: 823 pg/mL (ref 232–1245)

## 2024-03-10 LAB — HEMOGLOBIN A1C
Est. average glucose Bld gHb Est-mCnc: 108 mg/dL
Hgb A1c MFr Bld: 5.4 % (ref 4.8–5.6)

## 2024-03-10 LAB — VITAMIN D 25 HYDROXY (VIT D DEFICIENCY, FRACTURES): Vit D, 25-Hydroxy: 31.6 ng/mL (ref 30.0–100.0)

## 2024-03-10 MED ORDER — TAMOXIFEN CITRATE 20 MG PO TABS: 20.0000 mg | ORAL_TABLET | Freq: Every day | ORAL | 3 refills | Status: DC

## 2024-03-10 NOTE — Progress Notes (Signed)
 [facility] CONSULT NOTE  Patient Care Team: Early, Adriane Albe, NP as PCP - General (Nurse Practitioner) Concepcion Deck, MD as PCP - OBGYN (Obstetrics and Gynecology) Auther Bo, RN as Oncology Nurse Navigator Alane Hsu, RN as Oncology Nurse Navigator Oza Blumenthal, MD as Consulting Physician (General Surgery) Murleen Arms, MD as Consulting Physician (Hematology and Oncology) Johna Myers, MD as Consulting Physician (Radiation Oncology)  CHIEF COMPLAINTS/PURPOSE OF CONSULTATION:  Newly diagnosed breast cancer  HISTORY OF PRESENTING ILLNESS:  Ariel Ayala 48 y.o. female is here because of recent diagnosis of right breast cancer  I reviewed her records extensively and collaborated the history with the patient.  SUMMARY OF ONCOLOGIC HISTORY: Oncology History  Malignant neoplasm of upper-outer quadrant of right breast in female, estrogen receptor positive (HCC)  11/26/2023 Mammogram   Recalled from screening. Small mass within the right breast 12 o'clock position felt to correspond with the mammographically identified distortion. Targeted ultrasound is performed, showing a small irregular hypoechoic mass right breast 12 o'clock position 2 cm from the nipple measuring 6 x 9 x 5 mm.No right axillary adenopathy.       11/29/2023 Pathology Results   IDC, grade 1, The tumor cells are negative for Her2 (0).  Estrogen Receptor:  90%, POSITIVE, MODERATE STAINING INTENSITY  Progesterone Receptor:  100%, POSITIVE, STRONG STAINING INTENSITY  Proliferation Marker Ki67:  5%    12/04/2023 Initial Diagnosis   Malignant neoplasm of upper-outer quadrant of right breast in female, estrogen receptor positive (HCC)   12/08/2023 Cancer Staging   Staging form: Breast, AJCC 8th Edition - Clinical stage from 12/08/2023: Stage IA (cT1b, cN0, cM0, G1, ER+, PR+, HER2-) - Signed by Bettejane Brownie, PA-C on 12/08/2023 Stage prefix: Initial diagnosis Method of lymph node  assessment: Clinical Histologic grading system: 3 grade system   12/19/2023 Genetic Testing   Negative [facility] CancerNext+RNAinsight Panel.  Report date is 12/19/2023.   The [employer] CancerNext+RNAinsight Panel includes sequencing, rearrangement analysis, and RNA analysis for the following 39 genes: APC, ATM, BAP1, BARD1, BMPR1A, BRCA1, BRCA2, BRIP1, CDH1, CDKN2A, CHEK2, FH, FLCN, MET, MLH1, MSH2, MSH6, MUTYH, NF1, NTHL1, PALB2, PMS2, PTEN, RAD51C, RAD51D, SMAD4, STK11, TP53, TSC1, TSC2, and VHL (sequencing and deletion/duplication); AXIN2, HOXB13, MBD4, MSH3, POLD1 and POLE (sequencing only); EPCAM and GREM1 (deletion/duplication only).     She had right breast lumpectomy on 12/24/2023.  Showed invasive ductal carcinoma 1.8 cm grade 1, negative margins.  All lymph nodes negative for carcinoma. Oncotype DX 15, no benefit from adjuvant chemotherapy.  Discussed the use of AI scribe software for clinical note transcription with the patient, who gave verbal consent to proceed.  History of Present Illness    Ariel Ayala is a 48 year old female with breast cancer who presents for follow-up after surgery.  She underwent surgery for breast cancer, during which a 1.8 cm tumor was excised with clear margins. Three lymph nodes were removed, none of which showed cancer involvement. Her oncotype score was 15, indicating no need for chemotherapy.  She then completed adj radiation. She is now here to discuss tamoxifen . History of Present Illness Ariel Ayala is a 48 year old female with breast cancer who presents for follow-up after completing radiation therapy.  She completed her last day of radiation therapy on April 9th. She has noticed changes in her nipple, including a change in color and peeling, and describes her scar as 'angry' and having opened and healed. She is concerned about the appearance and  healing process.  She experienced wheezing and shortness of breath after first day of boost during  radiation, which was treated with a steroid pack. No previous episodes of these symptoms were noted, and they resolved after treatment.  She is considering starting tamoxifen  to reduce the risk of breast cancer recurrence. She has questions about its interaction with her current medication, Zoloft, which she takes at the lowest dose.  She is dealing with a frozen shoulder and has paused physical therapy. She is considering seeing an orthopedic specialist for a possible steroid injection.  She reports weight gain of about 7-10 pounds over the past two months and is concerned about potential weight gain associated with tamoxifen . She attributes some of the weight gain to perimenopausal changes.  She experiences night sweats and attributes them to perimenopausal symptoms, noting that she was taken off estrogen recently.    MEDICAL HISTORY:  Past Medical History:  Diagnosis Date   Asthma    as a child   Bloating 10/23/2020   CMV (cytomegalovirus infection) (HCC)    Early satiety 10/23/2020   Generalized abdominal pain 10/23/2020   Hair loss 03/12/2023   Indigestion 10/23/2020   Invasive ductal carcinoma of breast, female, 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   Splenomegaly    on CT   Vitamin D  deficiency 10/25/2020    SURGICAL HISTORY: Past Surgical History:  Procedure Laterality Date   BREAST BIOPSY Right 11/28/2023   MM RT BREAST BX W LOC DEV 1ST LESION IMAGE BX SPEC STEREO GUIDE 11/28/2023 [facility]   BREAST BIOPSY  12/23/2023   MM RT RADIOACTIVE SEED LOC MAMMO GUIDE 12/23/2023 [facility]   BREAST LUMPECTOMY WITH RADIOACTIVE SEED AND SENTINEL LYMPH NODE BIOPSY Right 12/24/2023   Procedure: RIGHT BREAST LUMPECTOMY WITH RADIOACTIVE SEED AND SENTINEL LYMPH NODE BIOPSY;  Surgeon: Oza Blumenthal, MD;  Location: [facility] OR;  Service: General;  Laterality: Right;   DILATION AND CURETTAGE OF UTERUS     FOOT SURGERY Right    with hardware   trigger thumb Right     SOCIAL HISTORY: Social  History   Socioeconomic History   Marital status: Single    Spouse name: Not on file   Number of children: Not on file   Years of education: Not on file   Highest education level: Not on file  Occupational History   Not on file  Tobacco Use   Smoking status: Never   Smokeless tobacco: Never  Vaping Use   Vaping status: Never Used  Substance and Sexual Activity   Alcohol use: Yes    Comment: 1-2/month   Drug use: No   Sexual activity: Not on file  Other Topics Concern   Not on file  Social History Narrative   Not on file   Social Drivers of Health   Financial Resource Strain: Not on file  Food Insecurity: No Food Insecurity (01/15/2024)   Hunger Vital Sign    Worried About Running Out of Food in the Last Year: Never true    Ran Out of Food in the Last Year: Never true  Transportation Needs: No Transportation Needs (01/15/2024)   PRAPARE - Transportation    Lack of Transportation (Medical): No    Lack of Transportation (Non-Medical): No  Physical Activity: Not on file  Stress: Not on file  Social Connections: Not on file  Intimate Partner Violence: Not At Risk (01/15/2024)   Humiliation, Afraid, Rape, and Kick questionnaire    Fear of Current or Ex-Partner: No    Emotionally  Abused: No    Physically Abused: No    Sexually Abused: No    FAMILY HISTORY: Family History  Problem Relation Age of Onset   Diabetes Mother    Thyroid disease Mother    Suicidality Father    Breast cancer Maternal Grandmother        dx 41s-70s   Colon cancer Cousin        dx 51s   Skin cancer Cousin        SCC    ALLERGIES:  is allergic to sulfa antibiotics and macrobid [nitrofurantoin].  MEDICATIONS:  Current Outpatient Medications  Medication Sig Dispense Refill   tamoxifen  (NOLVADEX ) 20 MG tablet Take 1 tablet (20 mg total) by mouth daily. 90 tablet 3   albuterol  (VENTOLIN  HFA) 108 (90 Base) MCG/ACT inhaler Inhale 2 puffs into the lungs every 6 (six) hours as needed for wheezing  or shortness of breath. 8 g 0   cetirizine (ZYRTEC) 10 MG tablet Take 10 mg by mouth daily as needed for allergies.     Cholecalciferol (VITAMIN D -3 PO) Take 1 capsule by mouth daily.     Cyanocobalamin (VITAMIN B-12 PO) Take 1 tablet by mouth daily.     ibuprofen (ADVIL) 200 MG tablet Take 400 mg by mouth 2 (two) times daily as needed for headache or moderate pain (pain score 4-6).     Melatonin 10 MG CAPS Take 10 mg by mouth at bedtime as needed (sleep).     Multiple Vitamin (MULTIVITAMIN) tablet Take 1 tablet by mouth daily.     naproxen sodium (ALEVE) 220 MG tablet Take 220 mg by mouth daily as needed (pain, headache).     rosuvastatin  (CRESTOR ) 5 MG tablet TAKE 1 TABLET (5 MG TOTAL) BY MOUTH DAILY. (Patient not taking: Reported on 03/09/2024) 90 tablet 1   sertraline (ZOLOFT) 25 MG tablet Take 25 mg by mouth daily.     No current facility-administered medications for this visit.    REVIEW OF SYSTEMS:   Constitutional: Denies fevers, chills or abnormal night sweats Eyes: Denies blurriness of vision, double vision or watery eyes Ears, nose, mouth, throat, and face: Denies mucositis or sore throat Respiratory: Denies cough, dyspnea or wheezes Cardiovascular: Denies palpitation, chest discomfort or lower extremity swelling Gastrointestinal:  Denies nausea, heartburn or change in bowel habits Skin: Denies abnormal skin rashes Lymphatics: Denies new lymphadenopathy or easy bruising Neurological:Denies numbness, tingling or new weaknesses Behavioral/Psych: Mood is stable, no new changes  Breast: Denies any palpable lumps or discharge All other systems were reviewed with the patient and are negative.  PHYSICAL EXAMINATION: ECOG PERFORMANCE STATUS: 0 - Asymptomatic  Vitals:   03/10/24 1312  BP: 127/84  Pulse: 80  Resp: 18  Temp: 98.2 F (36.8 C)  SpO2: 99%     Filed Weights   03/10/24 1312  Weight: 167 lb 1.6 oz (75.8 kg)      GENERAL:alert, no distress and  comfortable Right breast healing well. Post rad changes noted as expected  LABORATORY DATA:  I have reviewed the data as listed Lab Results  Component Value Date   WBC 8.7 03/09/2024   HGB 11.7 03/09/2024   HCT 36.8 03/09/2024   MCV 79 03/09/2024   PLT 382 03/09/2024   Lab Results  Component Value Date   NA 140 03/09/2024   K 4.0 03/09/2024   CL 102 03/09/2024   CO2 20 03/09/2024    RADIOGRAPHIC STUDIES: I have personally reviewed the radiological reports and agreed with the  findings in the report.  ASSESSMENT AND PLAN:  No problem-specific Assessment & Plan notes found for this encounter.   Assessment and Plan Assessment & Plan Breast cancer Post-radiation changes include skin desquamation and seroma formation. Discussed tamoxifen  to reduce local and systemic recurrence risk. Explained potential side effects and interaction with Zoloft. Addressed weight management during perimenopause. - Discussed AE from tamoxifen  including but not limited to post menopausal symptoms, vaginal discharge, risk of DVT/PE, endometrial hyperplasia and endometrial carcinoma, benefit on bone density. - Prescribe tamoxifen  and send to [facility] on [address]. - Advise on managing skin changes with aloe and lymphatic massage. - Monitor for changes in menstrual cycle and report any abnormalities. - Scheduled survivorship visit with Loris Ros for long-term side effects discussion.  Perimenopausal symptoms Experiencing night sweats and mood swings related to perimenopause. Tamoxifen  may exacerbate symptoms.  Frozen shoulder Ongoing frozen shoulder, currently on a pause from physical therapy. Considering orthopedic consultation for potential steroid injection. - Consult orthopedic specialist for evaluation and possible steroid injection.   All questions were answered. The patient knows to call the clinic with any problems, questions or concerns.    Murleen Arms, MD 03/10/24

## 2024-03-15 ENCOUNTER — Encounter: Payer: Self-pay | Admitting: Nurse Practitioner

## 2024-03-15 NOTE — Assessment & Plan Note (Signed)
 Repeat labs:

## 2024-03-15 NOTE — Assessment & Plan Note (Addendum)
 Post-radiation therapy completed on February 25, 2024. Experiencing fatigue and concerns about starting tamoxifen  due to potential side effects. Engaged in shared decision-making regarding tamoxifen , with reassurance about its tolerability and long-term use. Tamoxifen  is generally well-tolerated, though some patients experience side effects.  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 Ongoing management with physical therapy is planned. - Start tamoxifen  as planned after consultation with oncologist.

## 2024-03-15 NOTE — Assessment & Plan Note (Signed)
 Labs for nutritional status ordered today. High protein encouraged.

## 2024-03-15 NOTE — Assessment & Plan Note (Signed)
Repeat labs for monitoring.

## 2024-03-15 NOTE — Assessment & Plan Note (Signed)
 Changes in menses associated with perimenopause. Mood is stable.

## 2024-03-15 NOTE — Assessment & Plan Note (Signed)

## 2024-03-15 NOTE — Assessment & Plan Note (Signed)
 Allergic rhinitis with symptoms of sinus congestion and drainage. Zyrtec is being used as needed, though adherence is inconsistent.

## 2024-03-15 NOTE — Assessment & Plan Note (Signed)
 Asthma symptoms resolved following a recent episode of wheezing post-radiation therapy. Wheezing was managed with steroids, which were effective.

## 2024-03-15 NOTE — Assessment & Plan Note (Signed)
 Chronic right shoulder pain with ongoing management challenges. Previous treatments included physical therapy and injections. Referral to a new orthopedic specialist is planned for further evaluation.

## 2024-03-15 NOTE — Assessment & Plan Note (Signed)
 Not currently taking rosuvastatin  for management. Labs pending today. Recommend restart for CVD protection. Consider atorvastatin or lovastatin if unable to tolerate strength

## 2024-03-15 NOTE — Assessment & Plan Note (Signed)
 Repeat labs today for evaluation

## 2024-03-15 NOTE — Assessment & Plan Note (Signed)
 Post-radiation therapy completed on February 25, 2024. Experiencing fatigue and concerns about starting tamoxifen  due to potential side effects. Engaged in shared decision-making regarding tamoxifen , with reassurance about its tolerability and long-term use. Tamoxifen  is generally well-tolerated, though some patients experience side effects.  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 Ongoing management with physical therapy is planned. - Start tamoxifen  as planned after consultation with oncologist.

## 2024-03-15 NOTE — Assessment & Plan Note (Signed)
 Well controlled with sertraline with no concerning symptoms reported. Will continue to monitor.

## 2024-03-16 ENCOUNTER — Inpatient Hospital Stay: Admitting: Licensed Clinical Social Worker

## 2024-03-16 NOTE — Progress Notes (Signed)
 [facility] CSW Counseling Note  Patient was referred by self. Treatment type: Individual  Presenting Concerns: Patient and/or family reports the following symptoms/concerns: stress Duration of problem: 2 months; Severity of problem: moderate   Orientation:oriented to person, place, time/date, and situation.   Affect: Appropriate and Congruent Risk of harm to self or others: No plan to harm self or others  Patient and/or Family's Strengths/Protective Factors: Social connections, Social and Emotional competence, and Concrete supports in place (healthy food, safe environments, etc.)Ability for insight  Capable of independent living  Communication skills  Motivation for treatment/growth  Supportive family/friends      Goals Addressed: Patient will:  Reduce symptoms of: stress Increase healthy adjustment to current life circumstances   Progress towards Goals: Progressing   Interventions: Interventions utilized:  Solution Focused and Strength-based      Assessment: Patient continues to adjust to post-radiation and preparing for tamoxifen  (plans to start on Thursday).  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  Overall, pt is doing well. Spent time today on looking at in versus out of your control items.  Discussion on how to re-enter into social engagements while managing worry about getting fatigues or not feeling well during them.  Analogy used for easing back into exercise or into a pool vs jumping in the deep end.   Pt is also looking into [employer].    Plan: Follow up with CSW: 3 weeks Behavioral recommendations: Regarding job and other life stressors, notice what is in vs out of your control in those situations and focus on what you can control.  For socializing, start with more low stress situations that either require less energy expenditure or are easier to leave early  from if needed. Give yourself grace if you are unable to do everything.   Referral(s): Support group.  Added to [facility] interest list for fall       Sabien Umland E Filomeno Cromley, LCSW

## 2024-03-17 ENCOUNTER — Encounter: Payer: Self-pay | Admitting: Nurse Practitioner

## 2024-03-17 MED ORDER — IRON (FERROUS SULFATE) 325 (65 FE) MG PO TABS: 325.0000 mg | ORAL_TABLET | Freq: Every day | ORAL | 3 refills | Status: AC

## 2024-03-17 MED ORDER — VITAMIN D (ERGOCALCIFEROL) 1.25 MG (50000 UNIT) PO CAPS: 50000.0000 [IU] | ORAL_CAPSULE | ORAL | 3 refills | Status: AC

## 2024-03-17 NOTE — Addendum Note (Signed)
 Addended by: Shayana Hornstein, Abraham Hoffmann E on: 03/17/2024 12:14 PM   Modules accepted: Orders

## 2024-03-29 ENCOUNTER — Telehealth: Payer: Self-pay

## 2024-03-29 NOTE — Telephone Encounter (Signed)
 Lab visits are NEVER walk ins at our office. We do NOT do any walk ins at all.   Copied from CRM [id number]. Topic: Appointments - Scheduling Inquiry for Clinic >> Mar 29, 2024 10:18 AM Ariel Ayala wrote: Reason for CRM: Patient would like to schedule a lab appointment for the next 4-5 months from now but we are unclear with whether or not the lab is strictly walk-ins or if scheduling is needed.

## 2024-03-30 ENCOUNTER — Encounter: Payer: Self-pay | Admitting: Hematology and Oncology

## 2024-03-30 ENCOUNTER — Encounter: Payer: Self-pay | Admitting: Licensed Clinical Social Worker

## 2024-03-30 NOTE — Progress Notes (Signed)
 [facility] Clinical Social Work  CSW received inquiry from pt regarding Agricultural engineer.  CSW informed pt that program is currently on hold at [facility]. Pt asked about connecting with program through [facility] (as informed by the [facility]) and for referral.  CSW sent request to Environmental education officer at [facility] and encouraged pt to complete the required inquiry form as well.    Simara Rhyner E Edvardo Honse, LCSW

## 2024-04-05 ENCOUNTER — Ambulatory Visit: Attending: Surgery

## 2024-04-05 VITALS — Wt 168.0 lb

## 2024-04-05 DIAGNOSIS — Z483 Aftercare following surgery for neoplasm: Secondary | ICD-10-CM | POA: Insufficient documentation

## 2024-04-05 NOTE — Therapy (Signed)
 OUTPATIENT PHYSICAL THERAPY SOZO SCREENING NOTE   Patient Name: Ariel Ayala MRN: [id number] DOB:02-Nov-1976, 48 y.o., female Today's Date: 04/05/2024  PCP: Annella Kief, NP REFERRING PROVIDER: Oza Blumenthal, MD   PT End of Session - 04/05/24 0902     Visit Number 15   # unchanged due to screen only   PT Start Time 0855    PT Stop Time 0904    PT Time Calculation (min) 9 min    Activity Tolerance Patient tolerated treatment well    Behavior During Therapy [facility] for tasks assessed/performed             Past Medical History:  Diagnosis Date   Asthma    as a child   Bloating 10/23/2020   CMV (cytomegalovirus infection) (HCC)    CMV (cytomegalovirus infection) (HCC)    Community acquired pneumonia 01/06/2024   Early satiety 10/23/2020   Family history of thyroid disease in mother 05/09/2017   Generalized abdominal pain 10/23/2020   Hair loss 03/12/2023   Impingement syndrome of left shoulder region 09/10/2023   Indigestion 10/23/2020   Invasive ductal carcinoma of breast, female, 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   Pain in joint of left shoulder 09/10/2023   Splenomegaly    on CT   Tenosynovitis of right hand 01/29/2023   Vitamin D  deficiency 10/25/2020   Past Surgical History:  Procedure Laterality Date   BREAST BIOPSY Right 11/28/2023   MM RT BREAST BX W LOC DEV 1ST LESION IMAGE BX SPEC STEREO GUIDE 11/28/2023 [facility]   BREAST BIOPSY  12/23/2023   MM RT RADIOACTIVE SEED LOC MAMMO GUIDE 12/23/2023 [facility]   BREAST LUMPECTOMY WITH RADIOACTIVE SEED AND SENTINEL LYMPH NODE BIOPSY Right 12/24/2023   Procedure: RIGHT BREAST LUMPECTOMY WITH RADIOACTIVE SEED AND SENTINEL LYMPH NODE BIOPSY;  Surgeon: Oza Blumenthal, MD;  Location: [facility] OR;  Service: General;  Laterality: Right;   DILATION AND CURETTAGE OF UTERUS     FOOT SURGERY Right    with hardware   trigger thumb Right    Patient Active Problem List   Diagnosis Date Noted   Screening for colon cancer  03/09/2024   Genetic testing 12/22/2023   Invasive ductal carcinoma of breast, female, 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   Malignant neoplasm of upper-outer quadrant of right breast in female, estrogen receptor positive (HCC) 12/04/2023   Ductal carcinoma in situ of breast 11/28/2023   Adenomyosis of uterus 08/01/2023   Iron  deficiency 07/02/2023   Mixed hyperlipidemia 07/02/2023   Perimenopause 03/12/2023   Osteoarthritis of carpometacarpal (CMC) joint of thumb 01/29/2023   Adhesive capsulitis of right shoulder 08/01/2022   Body mass index (BMI) of 26.0-26.9 in adult 04/02/2022   Anxiety 04/01/2022   Primary insomnia 04/01/2022   Abnormal cervical Papanicolaou smear 11/06/2021   Genital herpes simplex 03/13/2021   Vitamin D  deficiency 10/25/2020   Vegetarian diet 10/23/2020   Encounter for annual physical exam 10/23/2020   TMJ tenderness, bilateral 02/11/2020   Seasonal allergies 02/11/2020   Elevated TSH 05/09/2017   Splenomegaly 04/16/2017   Asthma 11/12/2016    REFERRING DIAG: right breast cancer at risk for lymphedema  THERAPY DIAG: Aftercare following surgery for neoplasm  PERTINENT HISTORY: Patient was diagnosed on 11/28/2023 with right grade 1 invasive ductal carcinoma breast cancer. It measures 9 mm and is located in the upper outer quadrant. It is ER/PR positive and HER2 negative with a Ki67 of 5%. Rt lumpectomy and SLNB on 12/24/23 with 3 negative nodes removed.  Will be having  radiation. Right shoulder adhesive capsulitis diagnosed in mid 2023.  PRECAUTIONS: right UE Lymphedema risk, None  SUBJECTIVE: Pt returns for her first 3 month L-Dex screen.   PAIN:  Are you having pain? No  SOZO SCREENING: Patient was assessed today using the SOZO machine to determine the lymphedema index score. This was compared to her baseline score. It was determined that she is within the recommended range when compared to her baseline and no further action is needed at this time. She will continue  SOZO screenings. These are done every 3 months for 2 years post operatively followed by every 6 months for 2 years, and then annually.  Answered pts questions about compression sleeve vs flying and issued script from Dr. Arno Bibles for compression sleeve if she wishes to pursue.    L-DEX FLOWSHEETS - 04/05/24 0900       L-DEX LYMPHEDEMA SCREENING   Measurement Type Unilateral    L-DEX MEASUREMENT EXTREMITY Upper Extremity    POSITION  Standing    DOMINANT SIDE Right    At Risk Side Right    BASELINE SCORE (UNILATERAL) -3    L-DEX SCORE (UNILATERAL) -1.9    VALUE CHANGE (UNILAT) 1.1               Denyce Flank, PTA 04/05/2024, 9:04 AM

## 2024-04-06 ENCOUNTER — Inpatient Hospital Stay: Attending: Hematology and Oncology | Admitting: Licensed Clinical Social Worker

## 2024-04-06 NOTE — Progress Notes (Signed)
 [facility] CSW Counseling Note  Patient was referred by self. Treatment type: Individual  Presenting Concerns: Patient and/or family reports the following symptoms/concerns: stress Duration of problem: 2 months; Severity of problem: moderate   Orientation:oriented to person, place, time/date, and situation.   Affect: Appropriate and Congruent Risk of harm to self or others: No plan to harm self or others  Patient and/or Family's Strengths/Protective Factors: Social connections, Social and Emotional competence, and Concrete supports in place (healthy food, safe environments, etc.)Ability for insight  Capable of independent living  Communication skills  Motivation for treatment/growth  Supportive family/friends      Goals Addressed: Patient will:  Reduce symptoms of: stress Increase healthy adjustment to current life circumstances   Progress towards Goals: Progressing   Interventions: Interventions utilized:  Solution Focused and Strength-based      Assessment: Patient's mood is improved and anxiety is decreased. She is having some physical difficulty adjusting to tamoxifen  with significant joint aches and pains and slower moving, but is overall regaining energy and has a resilient mindset.  Pt is now engaging in social activities without anxiety about how she will feel or if she will need to leave early. She is working on adding more movement back and is planning to try to see a dietitian to help incorporate more foods that can meet her vitamin needs (was low on iron , vitamin d ).  Pt did have questions about some of the skin healing from radiation and about what follow-ups will look like in the coming year. CSW encouraged pt to speak with radonc team and that the other questions can be addressed during her survivorship care plan visit.    Plan: Follow up with CSW: 1 month Behavioral recommendations: Continue to incorporate more movement- can aim for activities like yoga or tai chi  that are gentle. Continue "and" mindset to help handle things that are more difficult, like joint aches, while recognizing progress.  Referral(s): Support group.  Added to [employer] interest list for fall and to yoga list       Ariel Vallely E Ewen Varnell, LCSW

## 2024-04-16 NOTE — Progress Notes (Addendum)
  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   Symptoms of fatigue have improved since completing therapy.  Symptoms of skin changes have improved since completing therapy.  Patient states she is "Having some mild swelling at/around  the nipple line but is slowly improving."  The patient was encouraged to avoid sun exposure in the area of prior treatment for up to one year following radiation with either sunscreen or by the style of clothing worn in the sun.  The patient has scheduled follow up with her medical oncologist Dr. Arno Bibles for ongoing surveillance, and was encouraged to call if she develops concerns or questions regarding radiation.   This concludes the interaction.  Avery Bodo, LPN

## 2024-04-19 ENCOUNTER — Ambulatory Visit
Admission: RE | Admit: 2024-04-19 | Discharge: 2024-04-19 | Disposition: A | Discharge status: Home / Self Care | Source: Ambulatory Visit | Attending: Hematology and Oncology | Admitting: Hematology and Oncology

## 2024-04-20 ENCOUNTER — Encounter: Payer: Self-pay | Admitting: Nurse Practitioner

## 2024-04-22 ENCOUNTER — Other Ambulatory Visit: Payer: Self-pay | Admitting: *Deleted

## 2024-04-22 MED ORDER — TAMOXIFEN CITRATE 10 MG PO TABS
ORAL_TABLET | ORAL | 1 refills | Status: DC
Start: 2024-04-22 — End: 2024-06-21

## 2024-05-04 ENCOUNTER — Encounter: Payer: Self-pay | Admitting: Hematology and Oncology

## 2024-05-11 ENCOUNTER — Ambulatory Visit: Admitting: Licensed Clinical Social Worker

## 2024-05-11 ENCOUNTER — Ambulatory Visit: Payer: Self-pay

## 2024-05-11 ENCOUNTER — Ambulatory Visit (INDEPENDENT_AMBULATORY_CARE_PROVIDER_SITE_OTHER): Admitting: Nurse Practitioner

## 2024-05-11 VITALS — BP 108/70 | HR 82 | Ht 61.0 in | Wt 166.0 lb

## 2024-05-11 DIAGNOSIS — J0140 Acute pansinusitis, unspecified: Secondary | ICD-10-CM

## 2024-05-11 DIAGNOSIS — H66003 Acute suppurative otitis media without spontaneous rupture of ear drum, bilateral: Secondary | ICD-10-CM

## 2024-05-11 MED ORDER — FLUCONAZOLE 150 MG PO TABS: ORAL_TABLET | ORAL | 2 refills | Status: DC

## 2024-05-11 MED ORDER — AMOXICILLIN-POT CLAVULANATE 875-125 MG PO TABS: 1.0000 | ORAL_TABLET | Freq: Two times a day (BID) | ORAL | 0 refills | Status: DC

## 2024-05-11 NOTE — Progress Notes (Signed)
 [facility] CSW Counseling Note  Patient was referred by self. Treatment type: Individual  Presenting Concerns: Patient and/or family reports the following symptoms/concerns: stress Duration of problem: 5 months; Severity of problem: moderate   Orientation:oriented to person, place, time/date, and situation.   Affect: Appropriate and Congruent Risk of harm to self or others: No plan to harm self or others  Patient and/or Family's Strengths/Protective Factors: Social connections, Social and Emotional competence, and Concrete supports in place (healthy food, safe environments, etc.)Ability for insight  Capable of independent living  Communication skills  Motivation for treatment/growth  Supportive family/friends      Goals Addressed: Patient will:  Reduce symptoms of: stress Increase healthy adjustment to current life circumstances   Progress towards Goals: Met   Interventions: Interventions utilized:  CBT and Strength-based      Assessment: Patient continues to exhibit improved coping with stress. She is still adjusting to medication changes (took a break from tamoxifen  and re-started on Sunday) and figuring out breast issues and dealing with normal life stressors with work, family, and day to day tasks. Pt is active in listening to her body and advocating for her needs. She has scheduled fun and social activities to look forward to.   Pt is experiencing difficulty initiating sleep recently with only falling asleep between 12-1am (typically would be around 10/10:30pm). She does not drink caffeine. Does not notice racing thoughts or stress but does feel energized at night. Briefly discussed sleep hygiene and doing a relaxing non-screen activity prior to bed. Utilized CBT-I strategies to discuss changing thinking around bed, education on limiting time in bed to only sleep, and getting out of bed if unable to sleep within about 20 minutes.     Plan: Follow up with CSW: 1 month Behavioral  recommendations: Message your doctors questions re: breast concerns, referral to Engineer, petroleum. For sleep, check side effects from medications to see if any you are taking at night may cause you to be more alert. Start including a relaxing, non-screen activity before bed, like a crossword. Get out of bed if not asleep within 20-30 min and then get back in bed once sleepy.  Referral(s): Support group.  Added to FYNN interest list for fall and to yoga list       Duilio Heritage E Rohaan Durnil, LCSW

## 2024-05-11 NOTE — Progress Notes (Unsigned)
  Camie FORBES Doing, DNP, AGNP-c [facility] [address] [place], [place] [address] [phone]   ACUTE VISIT- ESTABLISHED PATIENT  Height 5' 1 (1.549 m), weight 166 lb (75.3 kg).  Subjective:  HPI Ariel Ayala is a 48 y.o. female presents to day for evaluation of acute concern(s).     ROS negative except for what is listed in HPI. History, Medications, Surgery, SDOH, and Family History reviewed and updated as appropriate.  Objective:  Physical Exam      Assessment & Plan:   Problem List Items Addressed This Visit   None     Camie FORBES Doing, DNP, AGNP-c

## 2024-05-11 NOTE — Telephone Encounter (Signed)
  FYI Only or Action Required?: FYI only for provider.  Patient was last seen in primary care on 03/09/2024 by Early, Camie BRAVO, NP. Called Nurse Triage reporting Facial Pain. Symptoms began over a week ago. Interventions attempted: Rest, hydration, or home remedies. Symptoms are: gradually worsening.  Triage Disposition: See Physician Within 24 Hours  Patient/caregiver understands and will follow disposition?: Yes          Appointment made for 05/12/2024 at 11:45 AM with Dr Annabelle Fetters         Copied from CRM [id number]. Topic: Clinical - Red Word Triage >> May 11, 2024 10:50 AM Gustabo D wrote: Right ear has fluid feels bubbling, Patient thinks she has a sinus infections been having headache she has nose, eye and ear pain. Reason for Disposition  Earache  Answer Assessment - Initial Assessment Questions 1. LOCATION: Where does it hurt?      Between eyes, upper nose area, cheeks 2. ONSET: When did the sinus pain start?  (e.g., hours, days)      Over a week ago 3. SEVERITY: How bad is the pain?   (Scale 1-10; mild, moderate or severe)   - MILD (1-3): doesn't interfere with normal activities    - MODERATE (4-7): interferes with normal activities (e.g., work or school) or awakens from sleep   - SEVERE (8-10): excruciating pain and patient unable to do any normal activities        Sinus pressure 4. RECURRENT SYMPTOM: Have you ever had sinus problems before? If Yes, ask: When was the last time? and What happened that time?      She states she gets these issues from time to time but having a headache now and it lasting over a week she wanted to get checked out since she is going out of town in the next few days 5. NASAL CONGESTION: Is the nose blocked? If Yes, ask: Can you open it or must you breathe through your mouth?     A little congested 6. NASAL DISCHARGE: Do you have discharge from your nose? If so ask, What color?     No 7. FEVER: Do you have a fever?  If Yes, ask: What is it, how was it measured, and when did it start?      No 8. OTHER SYMPTOMS: Do you have any other symptoms? (e.g., sore throat, cough, earache, difficulty breathing)     Right ear feels full, pain/pressure around sinuses of face 9. PREGNANCY: Is there any chance you are pregnant? When was your last menstrual period?     No  Patient denies difficulty breathing, nausea, vomiting, diarrhea Patient is advised that if anything gets worse to go to the Emergency Room. Patient verbalized understanding.  Protocols used: Sinus Pain or Congestion-A-AH

## 2024-05-12 ENCOUNTER — Ambulatory Visit: Admitting: Family Medicine

## 2024-05-12 DIAGNOSIS — H66003 Acute suppurative otitis media without spontaneous rupture of ear drum, bilateral: Secondary | ICD-10-CM | POA: Insufficient documentation

## 2024-05-12 DIAGNOSIS — J0140 Acute pansinusitis, unspecified: Secondary | ICD-10-CM | POA: Insufficient documentation

## 2024-05-12 NOTE — Assessment & Plan Note (Signed)
 Presents with sinus pressure, facial pain, and ear fullness for over a week, indicating sinusitis. Examination reveals infection in both ears, consistent with bilateral otitis media. No fever or significant sore throat reported. Allergies may contribute to symptoms. Experiencing significant discomfort, particularly with atypical headaches. Augmentin  is chosen due to allergies to sulfa and Macrobid, which cause adverse reactions. Diflucan  is prescribed proactively to prevent potential yeast infection from antibiotic use. Sudafed recommended to manage ear pressure during upcoming flight. Steroid burst considered but deferred due to potential side effects and her preference to avoid steroids unless symptoms worsen. - Prescribe Augmentin  twice daily for 5 days. - Prescribe Diflucan  with instructions to take one tablet on day two of Augmentin , another three days later, and keep one in reserve. - Recommend using Sudafed (pseudoephedrine) before flying to alleviate ear pressure. - Advise to contact the clinic if symptoms persist or worsen by Thursday for potential steroid prescription.

## 2024-05-27 ENCOUNTER — Encounter: Payer: Self-pay | Admitting: Hematology and Oncology

## 2024-06-08 ENCOUNTER — Inpatient Hospital Stay: Attending: Hematology and Oncology | Admitting: Licensed Clinical Social Worker

## 2024-06-08 DIAGNOSIS — Z79810 Long term (current) use of selective estrogen receptor modulators (SERMs): Secondary | ICD-10-CM | POA: Insufficient documentation

## 2024-06-08 DIAGNOSIS — Z1721 Progesterone receptor positive status: Secondary | ICD-10-CM | POA: Insufficient documentation

## 2024-06-08 DIAGNOSIS — Z1732 Human epidermal growth factor receptor 2 negative status: Secondary | ICD-10-CM | POA: Insufficient documentation

## 2024-06-08 DIAGNOSIS — C50411 Malignant neoplasm of upper-outer quadrant of right female breast: Secondary | ICD-10-CM | POA: Insufficient documentation

## 2024-06-08 DIAGNOSIS — Z170 Estrogen receptor positive status [ER+]: Secondary | ICD-10-CM | POA: Insufficient documentation

## 2024-06-08 NOTE — Progress Notes (Signed)
 [facility] CSW Counseling Note  Patient was referred by self. Treatment type: Individual  Presenting Concerns: Patient and/or family reports the following symptoms/concerns: stress Duration of problem: 5 months; Severity of problem: moderate   Orientation:oriented to person, place, time/date, and situation.   Affect: Appropriate and Congruent Risk of harm to self or others: No plan to harm self or others  Patient and/or Family's Strengths/Protective Factors: Social connections, Social and Emotional competence, and Concrete supports in place (healthy food, safe environments, etc.)Ability for insight  Capable of independent living  Communication skills  Motivation for treatment/growth  Supportive family/friends      Goals Addressed: Patient will:  Reduce symptoms of: stress Increase healthy adjustment to current life circumstances   Progress towards Goals: Met   Interventions: Interventions utilized:  CBT and Strength-based      Assessment: Patient continues adjusting well in survivorship. She reports improved sleep since last visit (sleep onset around 10:30 pm, wake around 6 am) and is remembering medications and having fewer, more manageable side effects.  Pt continues to try to find time for her own social activities and rest while dealing with other life stressors with work and helping get her daughter back and forth to jobs. Discussed how to continue progress with reframing, focusing on what is in control, and including enjoyable activities. Pt will attend Finding Your New Normal class in the fall for additional support.     Plan: Follow up with CSW: PRN Behavioral recommendations: Continue utilizing coping & organizational skills and including activities you enjoy in your life. Reach out to CSW if in need of emotional support and to medical team with any health concerns Referral(s): Support group.  FYNN class September-October       Ariel Ayala E Ariel Fiorenza, LCSW

## 2024-06-11 ENCOUNTER — Inpatient Hospital Stay: Admitting: Adult Health

## 2024-06-14 ENCOUNTER — Telehealth: Payer: Self-pay

## 2024-06-14 ENCOUNTER — Inpatient Hospital Stay (HOSPITAL_BASED_OUTPATIENT_CLINIC_OR_DEPARTMENT_OTHER): Admitting: Adult Health

## 2024-06-14 VITALS — BP 124/86 | HR 74 | Temp 98.2°F | Resp 18 | Ht 61.0 in | Wt 169.0 lb

## 2024-06-14 DIAGNOSIS — C50411 Malignant neoplasm of upper-outer quadrant of right female breast: Secondary | ICD-10-CM | POA: Diagnosis present

## 2024-06-14 DIAGNOSIS — Z1721 Progesterone receptor positive status: Secondary | ICD-10-CM | POA: Diagnosis not present

## 2024-06-14 DIAGNOSIS — Z79810 Long term (current) use of selective estrogen receptor modulators (SERMs): Secondary | ICD-10-CM | POA: Diagnosis not present

## 2024-06-14 DIAGNOSIS — Z1732 Human epidermal growth factor receptor 2 negative status: Secondary | ICD-10-CM | POA: Diagnosis not present

## 2024-06-14 DIAGNOSIS — Z170 Estrogen receptor positive status [ER+]: Secondary | ICD-10-CM | POA: Diagnosis not present

## 2024-06-14 NOTE — Progress Notes (Signed)
 [facility] [place] [facility] Follow up:    Ariel Camie BRAVO, NP [address] [place] [id number]   DIAGNOSIS:  Cancer Staging  Malignant neoplasm of upper-outer quadrant of right breast in female, estrogen receptor positive (HCC) Staging form: Breast, AJCC 8th Edition - Clinical stage from 12/08/2023: Stage IA (cT1b, cN0, cM0, G1, ER+, PR+, HER2-) - Signed by [name], PA-C on 12/08/2023 Stage prefix: Initial diagnosis Method of lymph node assessment: Clinical Histologic grading system: 3 grade system    SUMMARY OF ONCOLOGIC HISTORY: Oncology History  Malignant neoplasm of upper-outer quadrant of right breast in female, estrogen receptor positive (HCC)  11/26/2023 Mammogram   Recalled from screening. Small mass within the right breast 12 o'clock position felt to correspond with the mammographically identified distortion. Targeted ultrasound is performed, showing a small irregular hypoechoic mass right breast 12 o'clock position 2 cm from the nipple measuring 6 x 9 x 5 mm.No right axillary adenopathy.       11/29/2023 Pathology Results   IDC, grade 1, The tumor cells are negative for Her2 (0).  Estrogen Receptor:  90%, POSITIVE, MODERATE STAINING INTENSITY  Progesterone Receptor:  100%, POSITIVE, STRONG STAINING INTENSITY  Proliferation Marker Ki67:  5%    12/04/2023 Initial Diagnosis   Malignant neoplasm of upper-outer quadrant of right breast in female, estrogen receptor positive (HCC)   12/08/2023 Cancer Staging   Staging form: Breast, AJCC 8th Edition - Clinical stage from 12/08/2023: Stage IA (cT1b, cN0, cM0, G1, ER+, PR+, HER2-) - Signed by [name], PA-C on 12/08/2023 Stage prefix: Initial diagnosis Method of lymph node assessment: Clinical Histologic grading system: 3 grade system   12/19/2023 Genetic Testing   Negative [facility] CancerNext+RNAinsight Panel.  Report date is 12/19/2023.   The [facility] CancerNext+RNAinsight Panel includes  sequencing, rearrangement analysis, and RNA analysis for the following 39 genes: APC, ATM, BAP1, BARD1, BMPR1A, BRCA1, BRCA2, BRIP1, CDH1, CDKN2A, CHEK2, FH, FLCN, MET, MLH1, MSH2, MSH6, MUTYH, NF1, NTHL1, PALB2, PMS2, PTEN, RAD51C, RAD51D, SMAD4, STK11, TP53, TSC1, TSC2, and VHL (sequencing and deletion/duplication); AXIN2, HOXB13, MBD4, MSH3, POLD1 and POLE (sequencing only); EPCAM and GREM1 (deletion/duplication only).   12/24/2023 Surgery   Right lumpectomy: IDC, 1.8 cm, grade 1, 3 SLN negative, margins negative.    01/29/2024 - 02/25/2024 Radiation Therapy   First Treatment Date: 2024-01-29 Last Treatment Date: 2024-02-25   Plan Name: Breast_R Site: Breast, Right Technique: 3D Mode: Photon Dose Per Fraction: 2.66 Gy Prescribed Dose (Delivered / Prescribed): 42.56 Gy / 42.56 Gy Prescribed Fxs (Delivered / Prescribed): 16 / 16   Plan Name: Breast_R_Bst Site: Breast, Right Technique: 3D Mode: Photon Dose Per Fraction: 2 Gy Prescribed Dose (Delivered / Prescribed): 8 Gy / 8 Gy Prescribed Fxs (Delivered / Prescribed): 4 / 4   04/2024 -  Anti-estrogen oral therapy   Tamoxifen  5mg  daily     CURRENT THERAPY:Tamoxifen   INTERVAL HISTORY:  Discussed the use of AI scribe software for clinical note transcription with the patient, who gave verbal consent to proceed.  History of Present Illness Ariel [place] is a 48 year old female with right breast invasive ductal carcinoma who presents with nipple drainage.  She was diagnosed with right breast invasive ductal carcinoma, ERPR positive, in January 2025 and underwent a right breast lumpectomy followed by adjuvant radiation therapy, completed in April 2025. She began tamoxifen  5 mg daily on April 22, 2024.  She experiences nipple drainage and inflammation with persistent crusting around the nipple. Some improvement is noted,  but crusting remains. She uses radioplex and is considering vitamin E oil.  Wearing a bra reduces swelling, but  nipples occasionally swell and become firm, likely due to scar tissue and radiation effects. She is concerned about asymmetry and shrinkage on the affected side post-radiation.     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   Adenomyosis of uterus 08/01/2023   Iron  deficiency 07/02/2023   Mixed hyperlipidemia 07/02/2023   Perimenopause 03/12/2023   Osteoarthritis of carpometacarpal (CMC) joint of thumb 01/29/2023   Adhesive capsulitis of right shoulder 08/01/2022   Body mass index (BMI) of 26.0-26.9 in adult 04/02/2022   Anxiety 04/01/2022   Primary insomnia 04/01/2022   Abnormal cervical Papanicolaou smear 11/06/2021   Genital herpes simplex 03/13/2021   Vitamin D  deficiency 10/25/2020   Vegetarian diet 10/23/2020   Encounter for annual physical exam 10/23/2020   TMJ tenderness, bilateral 02/11/2020   Seasonal allergies 02/11/2020   Elevated TSH 05/09/2017   Splenomegaly 04/16/2017   Asthma 11/12/2016    is allergic to sulfa antibiotics and macrobid [nitrofurantoin].  MEDICAL HISTORY: Past Medical History:  Diagnosis Date   Asthma    as a child   Bloating 10/23/2020   CMV (cytomegalovirus infection) (HCC)    CMV (cytomegalovirus infection) (HCC)    Community acquired pneumonia 01/06/2024   Early satiety 10/23/2020   Family history of thyroid disease in mother 05/09/2017   Generalized abdominal pain 10/23/2020   Hair loss 03/12/2023   Impingement syndrome of left shoulder region 09/10/2023    Indigestion 10/23/2020   Invasive ductal carcinoma of breast, female, 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   Pain in joint of left shoulder 09/10/2023   Splenomegaly    on CT   Tenosynovitis of right hand 01/29/2023   Vitamin D  deficiency 10/25/2020    SURGICAL HISTORY: Past Surgical History:  Procedure Laterality Date   BREAST BIOPSY Right 11/28/2023   MM RT BREAST BX W LOC DEV 1ST LESION IMAGE BX SPEC STEREO GUIDE 11/28/2023 [facility]   BREAST BIOPSY  12/23/2023   MM RT RADIOACTIVE SEED LOC MAMMO GUIDE 12/23/2023 [facility]   BREAST LUMPECTOMY WITH RADIOACTIVE SEED AND SENTINEL LYMPH NODE BIOPSY Right 12/24/2023   Procedure: RIGHT BREAST LUMPECTOMY WITH RADIOACTIVE SEED AND SENTINEL LYMPH NODE BIOPSY;  Surgeon: Vernetta Berg, MD;  Location: [facility] OR;  Service: General;  Laterality: Right;   DILATION AND CURETTAGE OF UTERUS     FOOT SURGERY Right    with hardware   trigger thumb Right     SOCIAL HISTORY: Social History   Socioeconomic History   Marital status: Single    Spouse name: Not on file   Number of children: Not on file   Years of education: Not on file   Highest education level: Master's degree (e.g., MA, MS, MEng, MEd, MSW, MBA)  Occupational History   Not on file  Tobacco Use   Smoking status: Never   Smokeless tobacco: Never  Vaping Use   Vaping status: Never Used  Substance and Sexual Activity   Alcohol use: Yes    Comment:  1-2/month   Drug use: No   Sexual activity: Not on file  Other Topics Concern   Not on file  Social History Narrative   Not on file   Social Drivers of Health   Financial Resource Strain: Low Risk  (05/11/2024)   Overall Financial Resource Strain (CARDIA)    Difficulty of Paying Living Expenses: Not hard at all  Food Insecurity: No Food Insecurity (05/11/2024)   Hunger Vital Sign    Worried About Running Out of Food in the Last Year: Never true    Ran Out of Food in the Last Year: Never true  Transportation Needs: No  Transportation Needs (05/11/2024)   PRAPARE - Administrator, Civil Service (Medical): No    Lack of Transportation (Non-Medical): No  Physical Activity: Inactive (05/11/2024)   Exercise Vital Sign    Days of Exercise per Week: 0 days    Minutes of Exercise per Session: Not on file  Stress: No Stress Concern Present (05/11/2024)   [employer] of Occupational Health - Occupational Stress Questionnaire    Feeling of Stress: Only a little  Social Connections: Unknown (05/11/2024)   Social Connection and Isolation Panel    Frequency of Communication with Friends and Family: More than three times a week    Frequency of Social Gatherings with Friends and Family: Once a week    Attends Religious Services: Patient declined    Database administrator or Organizations: Patient declined    Attends Banker Meetings: Not on file    Marital Status: Never married  Intimate Partner Violence: Not At Risk (01/15/2024)   Humiliation, Afraid, Rape, and Kick questionnaire    Fear of Current or Ex-Partner: No    Emotionally Abused: No    Physically Abused: No    Sexually Abused: No    FAMILY HISTORY: Family History  Problem Relation Age of Onset   Diabetes Mother    Thyroid disease Mother    Suicidality Father    Breast cancer Maternal Grandmother        dx 78s-70s   Colon cancer Cousin        dx 67s   Skin cancer Cousin        SCC    Review of Systems  Constitutional:  Negative for appetite change, chills, fatigue, fever and unexpected weight change.  HENT:   Negative for hearing loss, lump/mass and trouble swallowing.   Eyes:  Negative for eye problems and icterus.  Respiratory:  Negative for chest tightness, cough and shortness of breath.   Cardiovascular:  Negative for chest pain, leg swelling and palpitations.  Gastrointestinal:  Negative for abdominal distention, abdominal pain, constipation, diarrhea, nausea and vomiting.  Endocrine: Negative for hot flashes.   Genitourinary:  Negative for difficulty urinating.   Musculoskeletal:  Negative for arthralgias.  Skin:  Negative for itching and rash.  Neurological:  Negative for dizziness, extremity weakness, headaches and numbness.  Hematological:  Negative for adenopathy. Does not bruise/bleed easily.  Psychiatric/Behavioral:  Negative for depression. The patient is not nervous/anxious.       PHYSICAL EXAMINATION    Vitals:   06/14/24 0920  BP: 124/86  Pulse: 74  Resp: 18  Temp: 98.2 F (36.8 C)  SpO2: 100%    Physical Exam Constitutional:      General: She is not in acute distress.    Appearance: Normal appearance. She is not toxic-appearing.  HENT:     Head: Normocephalic and atraumatic.  Mouth/Throat:     Mouth: Mucous membranes are moist.     Pharynx: Oropharynx is clear. No oropharyngeal exudate or posterior oropharyngeal erythema.  Eyes:     General: No scleral icterus. Cardiovascular:     Rate and Rhythm: Normal rate and regular rhythm.     Pulses: Normal pulses.     Heart sounds: Normal heart sounds.  Pulmonary:     Effort: Pulmonary effort is normal.     Breath sounds: Normal breath sounds.  Chest:     Comments: Ridging noted above nipple and healing desquamation from radiation noted around nipple, no mass noted, no sign of local recurrence noted Abdominal:     General: Abdomen is flat. Bowel sounds are normal. There is no distension.     Palpations: Abdomen is soft.     Tenderness: There is no abdominal tenderness.  Musculoskeletal:        General: No swelling.     Cervical back: Neck supple.  Lymphadenopathy:     Cervical: No cervical adenopathy.     Upper Body:     Right upper body: No supraclavicular or axillary adenopathy.     Left upper body: No supraclavicular or axillary adenopathy.  Skin:    General: Skin is warm and dry.     Findings: No rash.  Neurological:     General: No focal deficit present.     Mental Status: She is alert.  Psychiatric:         Mood and Affect: Mood normal.        Behavior: Behavior normal.       ASSESSMENT and THERAPY PLAN:  Assessment and Plan Assessment & Plan Right breast invasive ductal carcinoma, ERPR positive Post-lumpectomy and radiation, cancer-free. On tamoxifen  since June 2025. - Continue tamoxifen  5 mg daily. - Schedule annual mammogram starting in December. - Recommend supplemental MRIs due to age at diagnosis. - Ensure follow-up with Dr. Vernetta and Dr. Loretha. - Discussed tamoxifen 's potential effects on vision and informed ophthalmologist.  Nipple drainage and inflammation Post-radiation changes with potential fibrotic tissue. Discussed asymmetry and tissue changes. - Apply vitamin E oil and massage into breast and scar line. - Consider compression bra for swelling and discomfort. - Report any worsening or new concerns.   RTC 07/16/2024 for SCP visit.     All questions were answered. The patient knows to call the clinic with any problems, questions or concerns. We can certainly see the patient much sooner if necessary.  Total encounter time:20 minutes*in face-to-face visit time, chart review, lab review, care coordination, order entry, and documentation of the encounter time.    Morna Kendall, NP 06/14/24 9:27 AM Medical Oncology and Hematology [facility] [address] [place], [place] [address] Tel. [phone]    Fax. [phone]  *Total Encounter Time as defined by the Centers for Medicare and Medicaid Services includes, in addition to the face-to-face time of a patient visit (documented in the note above) non-face-to-face time: obtaining and reviewing outside history, ordering and reviewing medications, tests or procedures, care coordination (communications with other health care professionals or caregivers) and documentation in the medical record.

## 2024-06-14 NOTE — Telephone Encounter (Signed)
 Attempted to call patient on primary number regarding today's appointments. Line continues to ring as busy.

## 2024-06-21 ENCOUNTER — Inpatient Hospital Stay: Admitting: Adult Health

## 2024-06-21 ENCOUNTER — Other Ambulatory Visit: Payer: Self-pay | Admitting: Hematology and Oncology

## 2024-07-12 ENCOUNTER — Ambulatory Visit: Attending: Surgery

## 2024-07-12 VITALS — Wt 167.4 lb

## 2024-07-12 DIAGNOSIS — Z483 Aftercare following surgery for neoplasm: Secondary | ICD-10-CM | POA: Insufficient documentation

## 2024-07-12 NOTE — Therapy (Signed)
 OUTPATIENT PHYSICAL THERAPY SOZO SCREENING NOTE   Patient Name: Ariel [place] MRN: [id number] DOB:Jun 23, 1976, 48 y.o., female Today's Date: 07/12/2024  PCP: [name], NP REFERRING PROVIDER: Vernetta Berg, MD   PT End of Session - 07/12/24 [id number]     Visit Number 15   # unchanged due to screen only   PT Start Time 0841    PT Stop Time 0844    PT Time Calculation (min) 3 min    Activity Tolerance Patient tolerated treatment well    Behavior During Therapy [facility] for tasks assessed/performed          Past Medical History:  Diagnosis Date   Asthma    as a child   Bloating 10/23/2020   CMV (cytomegalovirus infection) (HCC)    CMV (cytomegalovirus infection) (HCC)    Community acquired pneumonia 01/06/2024   Early satiety 10/23/2020   Family history of thyroid disease in mother 05/09/2017   Generalized abdominal pain 10/23/2020   Hair loss 03/12/2023   Impingement syndrome of left shoulder region 09/10/2023   Indigestion 10/23/2020   Invasive ductal carcinoma of breast, female, 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   Pain in joint of left shoulder 09/10/2023   Splenomegaly    on CT   Tenosynovitis of right hand 01/29/2023   Vitamin D  deficiency 10/25/2020   Past Surgical History:  Procedure Laterality Date   BREAST BIOPSY Right 11/28/2023   MM RT BREAST BX W LOC DEV 1ST LESION IMAGE BX SPEC STEREO GUIDE 11/28/2023 [facility]   BREAST BIOPSY  12/23/2023   MM RT RADIOACTIVE SEED LOC MAMMO GUIDE 12/23/2023 [facility]   BREAST LUMPECTOMY WITH RADIOACTIVE SEED AND SENTINEL LYMPH NODE BIOPSY Right 12/24/2023   Procedure: RIGHT BREAST LUMPECTOMY WITH RADIOACTIVE SEED AND SENTINEL LYMPH NODE BIOPSY;  Surgeon: Vernetta Berg, MD;  Location: [facility];  Service: General;  Laterality: Right;   DILATION AND CURETTAGE OF UTERUS     FOOT SURGERY Right    with hardware   trigger thumb Right    Patient Active Problem List   Diagnosis Date Noted   Acute non-recurrent pansinusitis  05/12/2024   Non-recurrent acute suppurative otitis media of both ears without spontaneous rupture of tympanic membranes 05/12/2024   Screening for colon cancer 03/09/2024   Genetic testing 12/22/2023   Invasive ductal carcinoma of breast, female, 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   Malignant neoplasm of upper-outer quadrant of right breast in female, estrogen receptor positive (HCC) 12/04/2023   Ductal carcinoma in situ of breast 11/28/2023   Adenomyosis of uterus 08/01/2023   Iron  deficiency 07/02/2023   Mixed hyperlipidemia 07/02/2023   Perimenopause 03/12/2023   Osteoarthritis of carpometacarpal (CMC) joint of thumb 01/29/2023   Adhesive capsulitis of right shoulder 08/01/2022   Body mass index (BMI) of 26.0-26.9 in adult 04/02/2022   Anxiety 04/01/2022   Primary insomnia 04/01/2022   Abnormal cervical Papanicolaou smear 11/06/2021   Genital herpes simplex 03/13/2021   Vitamin D  deficiency 10/25/2020   Vegetarian diet 10/23/2020   Encounter for annual physical exam 10/23/2020   TMJ tenderness, bilateral 02/11/2020   Seasonal allergies 02/11/2020   Elevated TSH 05/09/2017   Splenomegaly 04/16/2017   Asthma 11/12/2016    REFERRING DIAG: right breast cancer at risk for lymphedema  THERAPY DIAG: Aftercare following surgery for neoplasm  PERTINENT HISTORY: Patient was diagnosed on 11/28/2023 with right grade 1 invasive ductal carcinoma breast cancer. It measures 9 mm and is located in the upper outer quadrant. It is ER/PR positive and HER2 negative  with a Ki67 of 5%. Rt lumpectomy and SLNB on 12/24/23 with 3 negative nodes removed.  Will be having radiation. Right shoulder adhesive capsulitis diagnosed in mid 2023.  PRECAUTIONS: right UE Lymphedema risk, None  SUBJECTIVE: Pt returns for her first 3 month L-Dex screen.   PAIN:  Are you having pain? No  SOZO SCREENING: Patient was assessed today using the SOZO machine to determine the lymphedema index score. This was compared to her baseline  score. It was determined that she is within the recommended range when compared to her baseline and no further action is needed at this time. She will continue SOZO screenings. These are done every 3 months for 2 years post operatively followed by every 6 months for 2 years, and then annually.  Answered pts questions about compression sleeve vs flying and issued script from Dr. Loretha for compression sleeve if she wishes to pursue.    L-DEX FLOWSHEETS - 07/12/24 0800       L-DEX LYMPHEDEMA SCREENING   Measurement Type Unilateral    L-DEX MEASUREMENT EXTREMITY Upper Extremity    POSITION  Standing    DOMINANT SIDE Right    At Risk Side Right    BASELINE SCORE (UNILATERAL) -3    L-DEX SCORE (UNILATERAL) -1.9    VALUE CHANGE (UNILAT) 1.1            Aden Berwyn Caldron, PTA 07/12/2024, 8:44 AM

## 2024-07-16 ENCOUNTER — Encounter: Payer: Self-pay | Admitting: Adult Health

## 2024-07-16 ENCOUNTER — Telehealth: Payer: Self-pay

## 2024-07-16 ENCOUNTER — Inpatient Hospital Stay: Attending: Hematology and Oncology | Admitting: Adult Health

## 2024-07-16 VITALS — BP 115/81 | HR 80 | Temp 98.2°F | Resp 16 | Ht 61.0 in | Wt 169.0 lb

## 2024-07-16 DIAGNOSIS — C50411 Malignant neoplasm of upper-outer quadrant of right female breast: Secondary | ICD-10-CM | POA: Insufficient documentation

## 2024-07-16 DIAGNOSIS — Z170 Estrogen receptor positive status [ER+]: Secondary | ICD-10-CM | POA: Insufficient documentation

## 2024-07-16 DIAGNOSIS — Z923 Personal history of irradiation: Secondary | ICD-10-CM | POA: Diagnosis not present

## 2024-07-16 DIAGNOSIS — Z79810 Long term (current) use of selective estrogen receptor modulators (SERMs): Secondary | ICD-10-CM | POA: Insufficient documentation

## 2024-07-16 NOTE — Telephone Encounter (Signed)
 Per Np orders entered for [employer] and all supported documents faxed to [phone]. Faxed confirmation was received.

## 2024-07-16 NOTE — Progress Notes (Signed)
 SURVIVORSHIP VISIT:  BRIEF ONCOLOGIC HISTORY:  Oncology History  Malignant neoplasm of upper-outer quadrant of right breast in female, estrogen receptor positive (HCC)  11/26/2023 Mammogram   Recalled from screening. Small mass within the right breast 12 o'clock position felt to correspond with the mammographically identified distortion. Targeted ultrasound is performed, showing a small irregular hypoechoic mass right breast 12 o'clock position 2 cm from the nipple measuring 6 x 9 x 5 mm.No right axillary adenopathy.       11/29/2023 Pathology Results   IDC, grade 1, The tumor cells are negative for Her2 (0).  Estrogen Receptor:  90%, POSITIVE, MODERATE STAINING INTENSITY  Progesterone Receptor:  100%, POSITIVE, STRONG STAINING INTENSITY  Proliferation Marker Ki67:  5%    12/04/2023 Initial Diagnosis   Malignant neoplasm of upper-outer quadrant of right breast in female, estrogen receptor positive (HCC)   12/08/2023 Cancer Staging   Staging form: Breast, AJCC 8th Edition - Clinical stage from 12/08/2023: Stage IA (cT1b, cN0, cM0, G1, ER+, PR+, HER2-) - Signed by [name], PA-C on 12/08/2023 Stage prefix: Initial diagnosis Method of lymph node assessment: Clinical Histologic grading system: 3 grade system   12/19/2023 Genetic Testing   Negative [facility] CancerNext+RNAinsight Panel.  Report date is 12/19/2023.   The [facility] CancerNext+RNAinsight Panel includes sequencing, rearrangement analysis, and RNA analysis for the following 39 genes: APC, ATM, BAP1, BARD1, BMPR1A, BRCA1, BRCA2, BRIP1, CDH1, CDKN2A, CHEK2, FH, FLCN, MET, MLH1, MSH2, MSH6, MUTYH, NF1, NTHL1, PALB2, PMS2, PTEN, RAD51C, RAD51D, SMAD4, STK11, TP53, TSC1, TSC2, and VHL (sequencing and deletion/duplication); AXIN2, HOXB13, MBD4, MSH3, POLD1 and POLE (sequencing only); EPCAM and GREM1 (deletion/duplication only).   12/24/2023 Surgery   Right lumpectomy: IDC, 1.8 cm, grade 1, 3 SLN negative, margins negative.    01/29/2024  - 02/25/2024 Radiation Therapy   First Treatment Date: 2024-01-29 Last Treatment Date: 2024-02-25   Plan Name: Breast_R Site: Breast, Right Technique: 3D Mode: Photon Dose Per Fraction: 2.66 Gy Prescribed Dose (Delivered / Prescribed): 42.56 Gy / 42.56 Gy Prescribed Fxs (Delivered / Prescribed): 16 / 16   Plan Name: Breast_R_Bst Site: Breast, Right Technique: 3D Mode: Photon Dose Per Fraction: 2 Gy Prescribed Dose (Delivered / Prescribed): 8 Gy / 8 Gy Prescribed Fxs (Delivered / Prescribed): 4 / 4   04/2024 -  Anti-estrogen oral therapy   Tamoxifen  5mg  daily     Discussed the use of AI scribe software for clinical note transcription with the patient, who gave verbal consent to proceed.  History of Present Illness Ariel Ayala is a 48 year old female with stage 1A estrogen and progesterone receptor-positive breast cancer who presents for follow-up regarding her treatment and side effects.  She has completed lumpectomy and radiation therapy and is currently on tamoxifen  5 mg. She experiences night sweats, joint aches, hair thinning, and weight gain, which affects her ability to exercise. Her energy is improving, and she desires to increase her physical activity.  She is concerned about the risk of cancer recurrence and the impact of tamoxifen  on her menstrual cycle, noting her perimenopausal status. She is considering options regarding continuing tamoxifen  versus other anti-estrogen therapies, versus stopping adjuvant therapy all together.    REVIEW OF SYSTEMS:  Review of Systems  Constitutional:  Negative for appetite change, chills, fatigue, fever and unexpected weight change.  HENT:   Negative for hearing loss, lump/mass and trouble swallowing.   Eyes:  Negative for eye problems and icterus.  Respiratory:  Negative for chest tightness, cough and shortness of  breath.   Cardiovascular:  Negative for chest pain, leg swelling and palpitations.  Gastrointestinal:  Negative for  abdominal distention, abdominal pain, constipation, diarrhea, nausea and vomiting.  Endocrine: Negative for hot flashes.  Genitourinary:  Negative for difficulty urinating.   Musculoskeletal:  Negative for arthralgias.  Skin:  Negative for itching and rash.  Neurological:  Negative for dizziness, extremity weakness, headaches and numbness.  Hematological:  Negative for adenopathy. Does not bruise/bleed easily.  Psychiatric/Behavioral:  Negative for depression. The patient is not nervous/anxious.    Breast: Denies any new nodularity, masses, tenderness, nipple changes, or nipple discharge.       PAST MEDICAL/SURGICAL HISTORY:  Past Medical History:  Diagnosis Date   Asthma    as a child   Bloating 10/23/2020   CMV (cytomegalovirus infection) (HCC)    CMV (cytomegalovirus infection) (HCC)    Community acquired pneumonia 01/06/2024   Early satiety 10/23/2020   Family history of thyroid disease in mother 05/09/2017   Generalized abdominal pain 10/23/2020   Hair loss 03/12/2023   Impingement syndrome of left shoulder region 09/10/2023   Indigestion 10/23/2020   Invasive ductal carcinoma of breast, female, 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   Pain in joint of left shoulder 09/10/2023   Splenomegaly    on CT   Tenosynovitis of right hand 01/29/2023   Vitamin D  deficiency 10/25/2020   Past Surgical History:  Procedure Laterality Date   BREAST BIOPSY Right 11/28/2023   MM RT BREAST BX W LOC DEV 1ST LESION IMAGE BX SPEC STEREO GUIDE 11/28/2023 [facility]   BREAST BIOPSY  12/23/2023   MM RT RADIOACTIVE SEED LOC MAMMO GUIDE 12/23/2023 [facility]   BREAST LUMPECTOMY WITH RADIOACTIVE SEED AND SENTINEL LYMPH NODE BIOPSY Right 12/24/2023   Procedure: RIGHT BREAST LUMPECTOMY WITH RADIOACTIVE SEED AND SENTINEL LYMPH NODE BIOPSY;  Surgeon: Vernetta Berg, MD;  Location: [facility] OR;  Service: General;  Laterality: Right;   DILATION AND CURETTAGE OF UTERUS     FOOT SURGERY Right    with hardware    trigger thumb Right      ALLERGIES:  Allergies  Allergen Reactions   Sulfa Antibiotics Itching and Swelling    Throat swelling   Macrobid [Nitrofurantoin] Nausea And Vomiting and Other (See Comments)    Abdominal pain     CURRENT MEDICATIONS:  Outpatient Encounter Medications as of 07/16/2024  Medication Sig Note   albuterol  (VENTOLIN  HFA) 108 (90 Base) MCG/ACT inhaler Inhale 2 puffs into the lungs every 6 (six) hours as needed for wheezing or shortness of breath.    Cholecalciferol (VITAMIN D -3 PO) Take 1 capsule by mouth daily.    Cyanocobalamin (VITAMIN B-12 PO) Take 1 tablet by mouth daily.    ibuprofen (ADVIL) 200 MG tablet Take 400 mg by mouth 2 (two) times daily as needed for headache or moderate pain (pain score 4-6).    Iron , Ferrous Sulfate , 325 (65 Fe) MG TABS Take 325 mg by mouth daily.    Melatonin 10 MG CAPS Take 10 mg by mouth at bedtime as needed (sleep). 01/06/2024: prn   Multiple Vitamin (MULTIVITAMIN) tablet Take 1 tablet by mouth daily.    naproxen sodium (ALEVE) 220 MG tablet Take 220 mg by mouth daily as needed (pain, headache).    rosuvastatin  (CRESTOR ) 5 MG tablet TAKE 1 TABLET (5 MG TOTAL) BY MOUTH DAILY. 03/09/2024: Been off for a few months   sertraline (ZOLOFT) 25 MG tablet Take 25 mg by mouth daily.    tamoxifen  (NOLVADEX ) 10 MG  tablet START BY TAKING 1/2 TABLET (5MG ) DAILY AND THEN INCREASE TO 10 MG DAILY IF TOLERATED.    Vitamin D , Ergocalciferol , (DRISDOL ) 1.25 MG (50000 UNIT) CAPS capsule Take 1 capsule (50,000 Units total) by mouth every 7 (seven) days.    [DISCONTINUED] amoxicillin -clavulanate (AUGMENTIN ) 875-125 MG tablet Take 1 tablet by mouth 2 (two) times daily.    [DISCONTINUED] fluconazole  (DIFLUCAN ) 150 MG tablet Take one tablet every 3 days for a total of 3 doses.    No facility-administered encounter medications on file as of 07/16/2024.     ONCOLOGIC FAMILY HISTORY:  Family History  Problem Relation Age of Onset   Diabetes Mother     Thyroid disease Mother    Suicidality Father    Breast cancer Maternal Grandmother        dx 22s-70s   Colon cancer Cousin        dx 8s   Skin cancer Cousin        SCC     SOCIAL HISTORY:  Social History   Socioeconomic History   Marital status: Single    Spouse name: Not on file   Number of children: Not on file   Years of education: Not on file   Highest education level: Master's degree (e.g., MA, MS, MEng, MEd, MSW, MBA)  Occupational History   Not on file  Tobacco Use   Smoking status: Never   Smokeless tobacco: Never  Vaping Use   Vaping status: Never Used  Substance and Sexual Activity   Alcohol use: Yes    Comment: 1-2/month   Drug use: No   Sexual activity: Not on file  Other Topics Concern   Not on file  Social History Narrative   Not on file   Social Drivers of Health   Financial Resource Strain: Low Risk  (05/11/2024)   Overall Financial Resource Strain (CARDIA)    Difficulty of Paying Living Expenses: Not hard at all  Food Insecurity: No Food Insecurity (05/11/2024)   Hunger Vital Sign    Worried About Running Out of Food in the Last Year: Never true    Ran Out of Food in the Last Year: Never true  Transportation Needs: No Transportation Needs (05/11/2024)   PRAPARE - Administrator, Civil Service (Medical): No    Lack of Transportation (Non-Medical): No  Physical Activity: Inactive (05/11/2024)   Exercise Vital Sign    Days of Exercise per Week: 0 days    Minutes of Exercise per Session: Not on file  Stress: No Stress Concern Present (05/11/2024)   [employer] of Occupational Health - Occupational Stress Questionnaire    Feeling of Stress: Only a little  Social Connections: Unknown (05/11/2024)   Social Connection and Isolation Panel    Frequency of Communication with Friends and Family: More than three times a week    Frequency of Social Gatherings with Friends and Family: Once a week    Attends Religious Services: Patient  declined    Database administrator or Organizations: Patient declined    Attends Banker Meetings: Not on file    Marital Status: Never married  Intimate Partner Violence: Not At Risk (01/15/2024)   Humiliation, Afraid, Rape, and Kick questionnaire    Fear of Current or Ex-Partner: No    Emotionally Abused: No    Physically Abused: No    Sexually Abused: No     OBSERVATIONS/OBJECTIVE:  BP 115/81 (BP Location: Left Arm, Patient Position: Sitting)   Pulse  80   Temp 98.2 F (36.8 C) (Temporal)   Resp 16   Ht 5' 1 (1.549 m)   Wt 169 lb (76.7 kg)   SpO2 99%   BMI 31.93 kg/m  GENERAL: Patient is a well appearing female in no acute distress HEENT:  Sclerae anicteric.  Oropharynx clear and moist. No ulcerations or evidence of oropharyngeal candidiasis. Neck is supple.  NODES:  No cervical, supraclavicular, or axillary lymphadenopathy palpated.  BREAST EXAM:  right breast s/p lumpectomy and radiation, no sign of local recurrence, right breast benign LUNGS:  Clear to auscultation bilaterally.  No wheezes or rhonchi. HEART:  Regular rate and rhythm. No murmur appreciated. ABDOMEN:  Soft, nontender.  Positive, normoactive bowel sounds. No organomegaly palpated. MSK:  No focal spinal tenderness to palpation. Full range of motion bilaterally in the upper extremities. EXTREMITIES:  No peripheral edema.   SKIN:  Clear with no obvious rashes or skin changes. No nail dyscrasia. NEURO:  Nonfocal. Well oriented.  Appropriate affect.   LABORATORY DATA:  None for this visit.  DIAGNOSTIC IMAGING:  None for this visit.     ASSESSMENT AND PLAN:  Ms.. Franzen is a pleasant 48 y.o. female with Stage IA right breast invasive ductal carcinoma, ER+/PR+/HER2-, diagnosed in 11/2023, treated with lumpectomy, adjuvant radiation therapy, and anti-estrogen therapy with Tamoxifen  beginning in 04/2024.  She presents to the Survivorship Clinic for our initial meeting and routine follow-up  post-completion of treatment for breast cancer.    1. Stage IA right breast cancer:  Ms. Thorley is continuing to recover from definitive treatment for breast cancer. She will follow-up with her medical oncologist, Dr.  Loretha in 6 months with history and physical exam per surveillance protocol.  She will continue her anti-estrogen therapy with Tamoxifen  and we discussed risks with discontinuing it, the option of Zoladex plus an aromatase inhibitor.  She is going to work with trying the tamoxifen  for a little bit longer and will reach out and let us  know how it is going.  Her mammogram is due 10/2024; orders placed today.   Today, a comprehensive survivorship care plan and treatment summary was reviewed with the patient today detailing her breast cancer diagnosis, treatment course, potential late/long-term effects of treatment, appropriate follow-up care with recommendations for the future, and patient education resources.  A copy of this summary, along with a letter will be sent to the patient's primary care provider via mail/fax/In Basket message after today's visit.    2. Bone health:   She was given education on specific activities to promote bone health.  3. Cancer screening:  Due to Ms. Odekirk's history and her age, she should receive screening for skin cancers, colon cancer, and gynecologic cancers.  The information and recommendations are listed on the patient's comprehensive care plan/treatment summary and were reviewed in detail with the patient.    4. Health maintenance and wellness promotion: Ms. Poyser was encouraged to consume 5-7 servings of fruits and vegetables per day. We reviewed the Nutrition Rainbow handout.  She was also encouraged to engage in moderate to vigorous exercise for 30 minutes per day most days of the week.  She was instructed to limit her alcohol consumption and continue to abstain from tobacco use.     5. Support services/counseling: It is not uncommon for this  period of the patient's cancer care trajectory to be one of many emotions and stressors.   She was given information regarding our available services and encouraged to contact me with  any questions or for help enrolling in any of our support group/programs.    Follow up instructions:    -Return to cancer center in 6 months for f/u with Dr. Loretha  -Mammogram due in 10/2024 -Guardant reveal testing every 6 months.  -She is welcome to return back to the Survivorship Clinic at any time; no additional follow-up needed at this time.  -Consider referral back to survivorship as a long-term survivor for continued surveillance  The patient was provided an opportunity to ask questions and all were answered. The patient agreed with the plan and demonstrated an understanding of the instructions.   Total encounter time:45 minutes*in face-to-face visit time, chart review, lab review, care coordination, order entry, and documentation of the encounter time.    Morna Kendall, NP 07/16/24 4:41 PM Medical Oncology and Hematology [facility] [address] [place], [place] [address] Tel. [phone]    Fax. [phone]  *Total Encounter Time as defined by the Centers for Medicare and Medicaid Services includes, in addition to the face-to-face time of a patient visit (documented in the note above) non-face-to-face time: obtaining and reviewing outside history, ordering and reviewing medications, tests or procedures, care coordination (communications with other health care professionals or caregivers) and documentation in the medical record.

## 2024-07-26 ENCOUNTER — Encounter: Payer: Self-pay | Admitting: Nurse Practitioner

## 2024-07-26 ENCOUNTER — Encounter: Payer: Self-pay | Admitting: Hematology and Oncology

## 2024-07-26 NOTE — Telephone Encounter (Signed)
 Appointment scheduled.

## 2024-07-28 ENCOUNTER — Inpatient Hospital Stay: Attending: Hematology and Oncology | Admitting: Hematology and Oncology

## 2024-07-28 DIAGNOSIS — Z170 Estrogen receptor positive status [ER+]: Secondary | ICD-10-CM | POA: Diagnosis not present

## 2024-07-28 DIAGNOSIS — Z923 Personal history of irradiation: Secondary | ICD-10-CM | POA: Insufficient documentation

## 2024-07-28 DIAGNOSIS — M79606 Pain in leg, unspecified: Secondary | ICD-10-CM | POA: Insufficient documentation

## 2024-07-28 DIAGNOSIS — Z79810 Long term (current) use of selective estrogen receptor modulators (SERMs): Secondary | ICD-10-CM | POA: Insufficient documentation

## 2024-07-28 DIAGNOSIS — C50411 Malignant neoplasm of upper-outer quadrant of right female breast: Secondary | ICD-10-CM | POA: Insufficient documentation

## 2024-07-28 DIAGNOSIS — R252 Cramp and spasm: Secondary | ICD-10-CM | POA: Insufficient documentation

## 2024-07-28 MED ORDER — TRAZODONE HCL 50 MG PO TABS: 50.0000 mg | ORAL_TABLET | Freq: Every evening | ORAL | 0 refills | Status: AC | PRN

## 2024-07-28 NOTE — Telephone Encounter (Signed)
 Patient was called .

## 2024-07-28 NOTE — Progress Notes (Signed)
 BRIEF ONCOLOGIC HISTORY:  Oncology History  Malignant neoplasm of upper-outer quadrant of right breast in female, estrogen receptor positive (HCC)  11/26/2023 Mammogram   Recalled from screening. Small mass within the right breast 12 o'clock position felt to correspond with the mammographically identified distortion. Targeted ultrasound is performed, showing a small irregular hypoechoic mass right breast 12 o'clock position 2 cm from the nipple measuring 6 x 9 x 5 mm.No right axillary adenopathy.       11/29/2023 Pathology Results   IDC, grade 1, The tumor cells are negative for Her2 (0).  Estrogen Receptor:  90%, POSITIVE, MODERATE STAINING INTENSITY  Progesterone Receptor:  100%, POSITIVE, STRONG STAINING INTENSITY  Proliferation Marker Ki67:  5%    12/04/2023 Initial Diagnosis   Malignant neoplasm of upper-outer quadrant of right breast in female, estrogen receptor positive (HCC)   12/08/2023 Cancer Staging   Staging form: Breast, AJCC 8th Edition - Clinical stage from 12/08/2023: Stage IA (cT1b, cN0, cM0, G1, ER+, PR+, HER2-) - Signed by [name], PA-C on 12/08/2023 Stage prefix: Initial diagnosis Method of lymph node assessment: Clinical Histologic grading system: 3 grade system   12/19/2023 Genetic Testing   Negative [facility] CancerNext+RNAinsight Panel.  Report date is 12/19/2023.   The [facility] CancerNext+RNAinsight Panel includes sequencing, rearrangement analysis, and RNA analysis for the following 39 genes: APC, ATM, BAP1, BARD1, BMPR1A, BRCA1, BRCA2, BRIP1, CDH1, CDKN2A, CHEK2, FH, FLCN, MET, MLH1, MSH2, MSH6, MUTYH, NF1, NTHL1, PALB2, PMS2, PTEN, RAD51C, RAD51D, SMAD4, STK11, TP53, TSC1, TSC2, and VHL (sequencing and deletion/duplication); AXIN2, HOXB13, MBD4, MSH3, POLD1 and POLE (sequencing only); EPCAM and GREM1 (deletion/duplication only).   12/24/2023 Surgery   Right lumpectomy: IDC, 1.8 cm, grade 1, 3 SLN negative, margins negative.    01/29/2024 - 02/25/2024 Radiation  Therapy   First Treatment Date: 2024-01-29 Last Treatment Date: 2024-02-25   Plan Name: Breast_R Site: Breast, Right Technique: 3D Mode: Photon Dose Per Fraction: 2.66 Gy Prescribed Dose (Delivered / Prescribed): 42.56 Gy / 42.56 Gy Prescribed Fxs (Delivered / Prescribed): 16 / 16   Plan Name: Breast_R_Bst Site: Breast, Right Technique: 3D Mode: Photon Dose Per Fraction: 2 Gy Prescribed Dose (Delivered / Prescribed): 8 Gy / 8 Gy Prescribed Fxs (Delivered / Prescribed): 4 / 4   04/2024 -  Anti-estrogen oral therapy   Tamoxifen  5mg  daily     Discussed the use of AI scribe software for clinical note transcription with the patient, who gave verbal consent to proceed.  History of Present Illness  Ariel Ayala is a 48 year old female with stage 1A estrogen and progesterone receptor-positive breast cancer who presents for telephone follow up. She complaints of ongoing deep leg pain, she thinks its bone pain and not muscle pain She quit tamoxifen  almost 2 weeks ago and she still seems to be having lot of pain which is keeping her up at night. She hasn't had more than a few hrs of sleep and feels very tired She is hoping we can help her with some sleep aid as well. Rest of the pertinent 10 point ROS reviewed and neg.      PAST MEDICAL/SURGICAL HISTORY:  Past Medical History:  Diagnosis Date   Asthma    as a child   Bloating 10/23/2020   CMV (cytomegalovirus infection) (HCC)    CMV (cytomegalovirus infection) (HCC)    Community acquired pneumonia 01/06/2024   Early satiety 10/23/2020   Family history of thyroid disease in mother 05/09/2017   Generalized abdominal pain 10/23/2020  Hair loss 03/12/2023   Impingement syndrome of left shoulder region 09/10/2023   Indigestion 10/23/2020   Invasive ductal carcinoma of breast, female, 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   Pain in joint of left shoulder 09/10/2023   Splenomegaly    on CT   Tenosynovitis of right hand 01/29/2023    Vitamin D  deficiency 10/25/2020   Past Surgical History:  Procedure Laterality Date   BREAST BIOPSY Right 11/28/2023   MM RT BREAST BX W LOC DEV 1ST LESION IMAGE BX SPEC STEREO GUIDE 11/28/2023 [facility]   BREAST BIOPSY  12/23/2023   MM RT RADIOACTIVE SEED LOC MAMMO GUIDE 12/23/2023 [facility]   BREAST LUMPECTOMY WITH RADIOACTIVE SEED AND SENTINEL LYMPH NODE BIOPSY Right 12/24/2023   Procedure: RIGHT BREAST LUMPECTOMY WITH RADIOACTIVE SEED AND SENTINEL LYMPH NODE BIOPSY;  Surgeon: Vernetta Berg, MD;  Location: [facility] OR;  Service: General;  Laterality: Right;   DILATION AND CURETTAGE OF UTERUS     FOOT SURGERY Right    with hardware   trigger thumb Right      ALLERGIES:  Allergies  Allergen Reactions   Sulfa Antibiotics Itching and Swelling    Throat swelling   Macrobid [Nitrofurantoin] Nausea And Vomiting and Other (See Comments)    Abdominal pain     CURRENT MEDICATIONS:  Outpatient Encounter Medications as of 07/28/2024  Medication Sig Note   albuterol  (VENTOLIN  HFA) 108 (90 Base) MCG/ACT inhaler Inhale 2 puffs into the lungs every 6 (six) hours as needed for wheezing or shortness of breath.    Cholecalciferol (VITAMIN D -3 PO) Take 1 capsule by mouth daily.    Cyanocobalamin (VITAMIN B-12 PO) Take 1 tablet by mouth daily.    ibuprofen (ADVIL) 200 MG tablet Take 400 mg by mouth 2 (two) times daily as needed for headache or moderate pain (pain score 4-6).    Iron , Ferrous Sulfate , 325 (65 Fe) MG TABS Take 325 mg by mouth daily.    Melatonin 10 MG CAPS Take 10 mg by mouth at bedtime as needed (sleep). 01/06/2024: prn   Multiple Vitamin (MULTIVITAMIN) tablet Take 1 tablet by mouth daily.    naproxen sodium (ALEVE) 220 MG tablet Take 220 mg by mouth daily as needed (pain, headache).    rosuvastatin  (CRESTOR ) 5 MG tablet TAKE 1 TABLET (5 MG TOTAL) BY MOUTH DAILY. 03/09/2024: Been off for a few months   sertraline (ZOLOFT) 25 MG tablet Take 25 mg by mouth daily.    tamoxifen   (NOLVADEX ) 10 MG tablet START BY TAKING 1/2 TABLET (5MG ) DAILY AND THEN INCREASE TO 10 MG DAILY IF TOLERATED.    Vitamin D , Ergocalciferol , (DRISDOL ) 1.25 MG (50000 UNIT) CAPS capsule Take 1 capsule (50,000 Units total) by mouth every 7 (seven) days.    No facility-administered encounter medications on file as of 07/28/2024.     ONCOLOGIC FAMILY HISTORY:  Family History  Problem Relation Age of Onset   Diabetes Mother    Thyroid disease Mother    Suicidality Father    Breast cancer Maternal Grandmother        dx 55s-70s   Colon cancer Cousin        dx 29s   Skin cancer Cousin        SCC     SOCIAL HISTORY:  Social History   Socioeconomic History   Marital status: Single    Spouse name: Not on file   Number of children: Not on file   Years of education: Not on file   Highest education  level: Master's degree (e.g., MA, MS, MEng, MEd, MSW, MBA)  Occupational History   Not on file  Tobacco Use   Smoking status: Never   Smokeless tobacco: Never  Vaping Use   Vaping status: Never Used  Substance and Sexual Activity   Alcohol use: Yes    Comment: 1-2/month   Drug use: No   Sexual activity: Not on file  Other Topics Concern   Not on file  Social History Narrative   Not on file   Social Drivers of Health   Financial Resource Strain: Low Risk  (05/11/2024)   Overall Financial Resource Strain (CARDIA)    Difficulty of Paying Living Expenses: Not hard at all  Food Insecurity: No Food Insecurity (05/11/2024)   Hunger Vital Sign    Worried About Running Out of Food in the Last Year: Never true    Ran Out of Food in the Last Year: Never true  Transportation Needs: No Transportation Needs (05/11/2024)   PRAPARE - Administrator, Civil Service (Medical): No    Lack of Transportation (Non-Medical): No  Physical Activity: Inactive (05/11/2024)   Exercise Vital Sign    Days of Exercise per Week: 0 days    Minutes of Exercise per Session: Not on file  Stress: No  Stress Concern Present (05/11/2024)   [employer] of Occupational Health - Occupational Stress Questionnaire    Feeling of Stress: Only a little  Social Connections: Unknown (05/11/2024)   Social Connection and Isolation Panel    Frequency of Communication with Friends and Family: More than three times a week    Frequency of Social Gatherings with Friends and Family: Once a week    Attends Religious Services: Patient declined    Database administrator or Organizations: Patient declined    Attends Banker Meetings: Not on file    Marital Status: Never married  Intimate Partner Violence: Not At Risk (01/15/2024)   Humiliation, Afraid, Rape, and Kick questionnaire    Fear of Current or Ex-Partner: No    Emotionally Abused: No    Physically Abused: No    Sexually Abused: No     OBSERVATIONS/OBJECTIVE:  There were no vitals taken for this visit. V/S and PE deferred, telephone visit.   LABORATORY DATA:  None for this visit.  DIAGNOSTIC IMAGING:  None for this visit.     ASSESSMENT AND PLAN:  Ms.. Zhang is a pleasant 48 y.o. female with Stage IA right breast invasive ductal carcinoma, ER+/PR+/HER2-, diagnosed in 11/2023, treated with lumpectomy, adjuvant radiation therapy, and anti-estrogen therapy with Tamoxifen  beginning in 04/2024.   She is here for a telephone follow up. She is complaining of ongoing bone pain/leg pain which has been keeping her up at night. I asked her to continue holding tamoxifen  at this time. I also asked her to hold the statins for 2 weeks. With regards to sleep aid, she has tried OTC and we agreed to give her a short supply of trazodone . Once she recovers from these symptoms, we have discussed about considering OFS with [facility] to be done on Friday, ordered.  Time spent: 20 min  I connected with  Aviona L Bussie on 07/28/24 by a telephone application and verified that I am speaking with the correct person using two identifiers.   I  discussed the limitations of evaluation and management by telemedicine. The patient expressed understanding and agreed to proceed  Location of pt: Home Locaiton of provider: office '  *Total  Encounter Time as defined by the Centers for Medicare and Medicaid Services includes, in addition to the face-to-face time of a patient visit (documented in the note above) non-face-to-face time: obtaining and reviewing outside history, ordering and reviewing medications, tests or procedures, care coordination (communications with other health care professionals or caregivers) and documentation in the medical record.

## 2024-07-29 ENCOUNTER — Inpatient Hospital Stay: Admitting: Licensed Clinical Social Worker

## 2024-07-29 NOTE — Progress Notes (Signed)
 [facility] CSW Counseling Note  Patient was referred by self. Treatment type: Individual  Presenting Concerns: Patient and/or family reports the following symptoms/concerns: pain Duration of problem: 1 month; Severity of problem: moderate   Orientation:oriented to person, place, time/date, and situation.   Affect: Appropriate and Congruent Risk of harm to self or others: No plan to harm self or others  Patient and/or Family's Strengths/Protective Factors: Social connections, Social and Emotional competence, and Concrete supports in place (healthy food, safe environments, etc.)Ability for insight  Capable of independent living  Communication skills  Motivation for treatment/growth  Supportive family/friends      Goals Addressed: Patient will:  Increase ability to cope with pain and uncertainty with cancer treatments   Progress towards Goals: Initial   Interventions: Interventions utilized:  CBT and Meditation: color guided meditation for healing      Assessment: Patient returns to counseling due to significant pain from tamoxifen  even after stopping it over 2 weeks ago, significantly impacting sleep and mental well-being.  Pt is unable to engage in her desired activities and is needing to put on a facade to get through work.  CSW provided education on integrative therapies to assist with pain from AI.  Discussed utilizing meditation and relaxation skills to assist with managing the emotional toll from pain.  Practiced color healing meditation today.    Plan: Follow up with CSW: 2 weeks Behavioral recommendations: Practice deep breathing and meditation at night to assist with perception of pain and sleep. Can use Youtube or apps like Meditation Studio or Calm for recordings. Referral(s): Support group.  [facility] class September-October       Majel Giel E Nga Rabon, LCSW

## 2024-07-30 ENCOUNTER — Ambulatory Visit: Admitting: Nurse Practitioner

## 2024-07-30 ENCOUNTER — Inpatient Hospital Stay

## 2024-07-30 DIAGNOSIS — M79606 Pain in leg, unspecified: Secondary | ICD-10-CM | POA: Diagnosis not present

## 2024-07-30 DIAGNOSIS — C50411 Malignant neoplasm of upper-outer quadrant of right female breast: Secondary | ICD-10-CM | POA: Diagnosis present

## 2024-07-30 DIAGNOSIS — Z79810 Long term (current) use of selective estrogen receptor modulators (SERMs): Secondary | ICD-10-CM | POA: Diagnosis not present

## 2024-07-30 DIAGNOSIS — R252 Cramp and spasm: Secondary | ICD-10-CM | POA: Diagnosis not present

## 2024-07-30 DIAGNOSIS — Z170 Estrogen receptor positive status [ER+]: Secondary | ICD-10-CM | POA: Diagnosis not present

## 2024-07-30 DIAGNOSIS — Z923 Personal history of irradiation: Secondary | ICD-10-CM | POA: Diagnosis not present

## 2024-07-30 LAB — CBC WITH DIFFERENTIAL/PLATELET
Abs Immature Granulocytes: 0.05 K/uL (ref 0.00–0.07)
Basophils Absolute: 0 K/uL (ref 0.0–0.1)
Basophils Relative: 0 %
Eosinophils Absolute: 0.1 K/uL (ref 0.0–0.5)
Eosinophils Relative: 1 %
HCT: 37.8 % (ref 36.0–46.0)
Hemoglobin: 12.7 g/dL (ref 12.0–15.0)
Immature Granulocytes: 1 %
Lymphocytes Relative: 23 %
Lymphs Abs: 2 K/uL (ref 0.7–4.0)
MCH: 27.4 pg (ref 26.0–34.0)
MCHC: 33.6 g/dL (ref 30.0–36.0)
MCV: 81.5 fL (ref 80.0–100.0)
Monocytes Absolute: 0.5 K/uL (ref 0.1–1.0)
Monocytes Relative: 6 %
Neutro Abs: 5.9 K/uL (ref 1.7–7.7)
Neutrophils Relative %: 69 %
Platelets: 358 K/uL (ref 150–400)
RBC: 4.64 MIL/uL (ref 3.87–5.11)
RDW: 14.9 % (ref 11.5–15.5)
WBC: 8.6 K/uL (ref 4.0–10.5)
nRBC: 0 % (ref 0.0–0.2)

## 2024-07-30 LAB — CMP (CANCER CENTER ONLY)
ALT: 9 U/L (ref 0–44)
AST: 13 U/L — ABNORMAL LOW (ref 15–41)
Albumin: 4 g/dL (ref 3.5–5.0)
Alkaline Phosphatase: 51 U/L (ref 38–126)
Anion gap: 8 (ref 5–15)
BUN: 13 mg/dL (ref 6–20)
CO2: 29 mmol/L (ref 22–32)
Calcium: 9.1 mg/dL (ref 8.9–10.3)
Chloride: 103 mmol/L (ref 98–111)
Creatinine: 0.7 mg/dL (ref 0.44–1.00)
GFR, Estimated: >60 mL/min (ref >60)
Glucose, Bld: 126 mg/dL — ABNORMAL HIGH (ref 70–99)
Potassium: 3.7 mmol/L (ref 3.5–5.1)
Sodium: 140 mmol/L (ref 135–145)
Total Bilirubin: 0.3 mg/dL (ref 0.0–1.2)
Total Protein: 7.3 g/dL (ref 6.5–8.1)

## 2024-07-30 LAB — MAGNESIUM: Magnesium: 1.9 mg/dL (ref 1.7–2.4)

## 2024-07-30 LAB — CK: Total CK: 28 U/L — ABNORMAL LOW (ref 38–234)

## 2024-08-02 ENCOUNTER — Other Ambulatory Visit

## 2024-08-10 ENCOUNTER — Inpatient Hospital Stay: Admitting: Licensed Clinical Social Worker

## 2024-08-10 NOTE — Progress Notes (Signed)
 [facility] CSW Counseling Note  Patient was referred by self. Treatment type: Individual  Presenting Concerns: Patient and/or family reports the following symptoms/concerns: pain Duration of problem: 1 month; Severity of problem: moderate   Orientation:oriented to person, place, time/date, and situation.   Affect: Appropriate and Congruent Risk of harm to self or others: No plan to harm self or others  Patient and/or Family's Strengths/Protective Factors: Social connections, Social and Emotional competence, and Concrete supports in place (healthy food, safe environments, etc.)Ability for insight  Capable of independent living  Communication skills  Motivation for treatment/growth  Supportive family/friends      Goals Addressed: Patient will:  Increase ability to cope with pain and uncertainty with cancer treatments   Progress towards Goals: Progressing   Interventions: Interventions utilized:  CBT      Assessment: Patient reports continued pain but a shift in that since last week. She has a follow-up with MD tomorrow and will review options going forward. Spent time today discussing patient's mental and emotional response to various potential situations (back on tamoxifen , discontinue meds). Pt has good insight into her thought process and emotional well being in regard to fear of recurrence vs coping with chronic pain issues.  Utilized CBT to frame using a facade to not talk about pain vs knowing the pain is there but making a choice to focus on other, more desirable things.      Plan: Follow up with CSW: 3 weeks Behavioral recommendations: Check in with yourself on emotional and physical response to options after appointment tomorrow. Utilize reframe around pain and when you don't discuss the pain.  Referral(s): Support group.  [facility] class September-October       Arnice Vanepps E Mersedes Alber, LCSW

## 2024-08-11 ENCOUNTER — Other Ambulatory Visit

## 2024-08-11 ENCOUNTER — Inpatient Hospital Stay (HOSPITAL_BASED_OUTPATIENT_CLINIC_OR_DEPARTMENT_OTHER): Admitting: Hematology and Oncology

## 2024-08-11 VITALS — BP 119/72 | HR 66 | Temp 97.9°F | Resp 18 | Wt 170.0 lb

## 2024-08-11 DIAGNOSIS — Z170 Estrogen receptor positive status [ER+]: Secondary | ICD-10-CM | POA: Diagnosis not present

## 2024-08-11 DIAGNOSIS — C50411 Malignant neoplasm of upper-outer quadrant of right female breast: Secondary | ICD-10-CM

## 2024-08-11 DIAGNOSIS — E611 Iron deficiency: Secondary | ICD-10-CM

## 2024-08-11 NOTE — Progress Notes (Signed)
 BRIEF ONCOLOGIC HISTORY:  Oncology History  Malignant neoplasm of upper-outer quadrant of right breast in female, estrogen receptor positive (HCC)  11/26/2023 Mammogram   Recalled from screening. Small mass within the right breast 12 o'clock position felt to correspond with the mammographically identified distortion. Targeted ultrasound is performed, showing a small irregular hypoechoic mass right breast 12 o'clock position 2 cm from the nipple measuring 6 x 9 x 5 mm.No right axillary adenopathy.       11/29/2023 Pathology Results   IDC, grade 1, The tumor cells are negative for Her2 (0).  Estrogen Receptor:  90%, POSITIVE, MODERATE STAINING INTENSITY  Progesterone Receptor:  100%, POSITIVE, STRONG STAINING INTENSITY  Proliferation Marker Ki67:  5%    12/04/2023 Initial Diagnosis   Malignant neoplasm of upper-outer quadrant of right breast in female, estrogen receptor positive (HCC)   12/08/2023 Cancer Staging   Staging form: Breast, AJCC 8th Edition - Clinical stage from 12/08/2023: Stage IA (cT1b, cN0, cM0, G1, ER+, PR+, HER2-) - Signed by [name], PA-C on 12/08/2023 Stage prefix: Initial diagnosis Method of lymph node assessment: Clinical Histologic grading system: 3 grade system   12/19/2023 Genetic Testing   Negative [facility] CancerNext+RNAinsight Panel.  Report date is 12/19/2023.   The [facility] CancerNext+RNAinsight Panel includes sequencing, rearrangement analysis, and RNA analysis for the following 39 genes: APC, ATM, BAP1, BARD1, BMPR1A, BRCA1, BRCA2, BRIP1, CDH1, CDKN2A, CHEK2, FH, FLCN, MET, MLH1, MSH2, MSH6, MUTYH, NF1, NTHL1, PALB2, PMS2, PTEN, RAD51C, RAD51D, SMAD4, STK11, TP53, TSC1, TSC2, and VHL (sequencing and deletion/duplication); AXIN2, HOXB13, MBD4, MSH3, POLD1 and POLE (sequencing only); EPCAM and GREM1 (deletion/duplication only).   12/24/2023 Surgery   Right lumpectomy: IDC, 1.8 cm, grade 1, 3 SLN negative, margins negative.    01/29/2024 - 02/25/2024 Radiation  Therapy   First Treatment Date: 2024-01-29 Last Treatment Date: 2024-02-25   Plan Name: Breast_R Site: Breast, Right Technique: 3D Mode: Photon Dose Per Fraction: 2.66 Gy Prescribed Dose (Delivered / Prescribed): 42.56 Gy / 42.56 Gy Prescribed Fxs (Delivered / Prescribed): 16 / 16   Plan Name: Breast_R_Bst Site: Breast, Right Technique: 3D Mode: Photon Dose Per Fraction: 2 Gy Prescribed Dose (Delivered / Prescribed): 8 Gy / 8 Gy Prescribed Fxs (Delivered / Prescribed): 4 / 4   04/2024 -  Anti-estrogen oral therapy   Tamoxifen  5mg  daily     Discussed the use of AI scribe software for clinical note transcription with the patient, who gave verbal consent to proceed.  History of Present Illness  Ariel Ayala is a 48 year old female with stage 1A estrogen and progesterone receptor-positive breast cancer who presents for telephone follow up. She complaints of ongoing deep leg pain, she thinks its bone pain and not muscle pain She quit tamoxifen  almost 2 weeks ago and she still seems to be having lot of pain which is keeping her up at night. She hasn't had more than a few hrs of sleep and feels very tired She is hoping we can help her with some sleep aid as well. Rest of the pertinent 10 point ROS reviewed and neg.      PAST MEDICAL/SURGICAL HISTORY:  Past Medical History:  Diagnosis Date   Asthma    as a child   Bloating 10/23/2020   CMV (cytomegalovirus infection) (HCC)    CMV (cytomegalovirus infection) (HCC)    Community acquired pneumonia 01/06/2024   Early satiety 10/23/2020   Family history of thyroid disease in mother 05/09/2017   Generalized abdominal pain 10/23/2020  Hair loss 03/12/2023   Impingement syndrome of left shoulder region 09/10/2023   Indigestion 10/23/2020   Invasive ductal carcinoma of breast, female, 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   Pain in joint of left shoulder 09/10/2023   Splenomegaly    on CT   Tenosynovitis of right hand 01/29/2023    Vitamin D  deficiency 10/25/2020   Past Surgical History:  Procedure Laterality Date   BREAST BIOPSY Right 11/28/2023   MM RT BREAST BX W LOC DEV 1ST LESION IMAGE BX SPEC STEREO GUIDE 11/28/2023 [facility]   BREAST BIOPSY  12/23/2023   MM RT RADIOACTIVE SEED LOC MAMMO GUIDE 12/23/2023 [facility]   BREAST LUMPECTOMY WITH RADIOACTIVE SEED AND SENTINEL LYMPH NODE BIOPSY Right 12/24/2023   Procedure: RIGHT BREAST LUMPECTOMY WITH RADIOACTIVE SEED AND SENTINEL LYMPH NODE BIOPSY;  Surgeon: Vernetta Berg, MD;  Location: [facility] OR;  Service: General;  Laterality: Right;   DILATION AND CURETTAGE OF UTERUS     FOOT SURGERY Right    with hardware   trigger thumb Right      ALLERGIES:  Allergies  Allergen Reactions   Sulfa Antibiotics Itching and Swelling    Throat swelling   Macrobid [Nitrofurantoin] Nausea And Vomiting and Other (See Comments)    Abdominal pain     CURRENT MEDICATIONS:  Outpatient Encounter Medications as of 08/11/2024  Medication Sig Note   albuterol  (VENTOLIN  HFA) 108 (90 Base) MCG/ACT inhaler Inhale 2 puffs into the lungs every 6 (six) hours as needed for wheezing or shortness of breath.    Cholecalciferol (VITAMIN D -3 PO) Take 1 capsule by mouth daily.    Cyanocobalamin (VITAMIN B-12 PO) Take 1 tablet by mouth daily.    ibuprofen (ADVIL) 200 MG tablet Take 400 mg by mouth 2 (two) times daily as needed for headache or moderate pain (pain score 4-6).    Iron , Ferrous Sulfate , 325 (65 Fe) MG TABS Take 325 mg by mouth daily.    ketoconazole (NIZORAL) 2 % shampoo Apply 1 Application topically 2 (two) times a week.    Melatonin 10 MG CAPS Take 10 mg by mouth at bedtime as needed (sleep). 01/06/2024: prn   Multiple Vitamin (MULTIVITAMIN) tablet Take 1 tablet by mouth daily.    naproxen sodium (ALEVE) 220 MG tablet Take 220 mg by mouth daily as needed (pain, headache).    rosuvastatin  (CRESTOR ) 5 MG tablet TAKE 1 TABLET (5 MG TOTAL) BY MOUTH DAILY. 03/09/2024: Been off  for a few months   sertraline (ZOLOFT) 25 MG tablet Take 25 mg by mouth daily.    tamoxifen  (NOLVADEX ) 10 MG tablet START BY TAKING 1/2 TABLET (5MG ) DAILY AND THEN INCREASE TO 10 MG DAILY IF TOLERATED.    traZODone  (DESYREL ) 50 MG tablet Take 1 tablet (50 mg total) by mouth at bedtime as needed for sleep.    Vitamin D , Ergocalciferol , (DRISDOL ) 1.25 MG (50000 UNIT) CAPS capsule Take 1 capsule (50,000 Units total) by mouth every 7 (seven) days.    No facility-administered encounter medications on file as of 08/11/2024.     ONCOLOGIC FAMILY HISTORY:  Family History  Problem Relation Age of Onset   Diabetes Mother    Thyroid disease Mother    Suicidality Father    Breast cancer Maternal Grandmother        dx 45s-70s   Colon cancer Cousin        dx 22s   Skin cancer Cousin        SCC     SOCIAL HISTORY:  Social History   Socioeconomic History   Marital status: Single    Spouse name: Not on file   Number of children: Not on file   Years of education: Not on file   Highest education level: Master's degree (e.g., MA, MS, MEng, MEd, MSW, MBA)  Occupational History   Not on file  Tobacco Use   Smoking status: Never   Smokeless tobacco: Never  Vaping Use   Vaping status: Never Used  Substance and Sexual Activity   Alcohol use: Yes    Comment: 1-2/month   Drug use: No   Sexual activity: Not on file  Other Topics Concern   Not on file  Social History Narrative   Not on file   Social Drivers of Health   Financial Resource Strain: Low Risk  (05/11/2024)   Overall Financial Resource Strain (CARDIA)    Difficulty of Paying Living Expenses: Not hard at all  Food Insecurity: No Food Insecurity (05/11/2024)   Hunger Vital Sign    Worried About Running Out of Food in the Last Year: Never true    Ran Out of Food in the Last Year: Never true  Transportation Needs: No Transportation Needs (05/11/2024)   PRAPARE - Administrator, Civil Service (Medical): No    Lack of  Transportation (Non-Medical): No  Physical Activity: Inactive (05/11/2024)   Exercise Vital Sign    Days of Exercise per Week: 0 days    Minutes of Exercise per Session: Not on file  Stress: No Stress Concern Present (05/11/2024)   [employer] of Occupational Health - Occupational Stress Questionnaire    Feeling of Stress: Only a little  Social Connections: Unknown (05/11/2024)   Social Connection and Isolation Panel    Frequency of Communication with Friends and Family: More than three times a week    Frequency of Social Gatherings with Friends and Family: Once a week    Attends Religious Services: Patient declined    Database administrator or Organizations: Patient declined    Attends Banker Meetings: Not on file    Marital Status: Never married  Intimate Partner Violence: Not At Risk (01/15/2024)   Humiliation, Afraid, Rape, and Kick questionnaire    Fear of Current or Ex-Partner: No    Emotionally Abused: No    Physically Abused: No    Sexually Abused: No     OBSERVATIONS/OBJECTIVE:  BP 119/72 (BP Location: Left Arm, Patient Position: Sitting, Cuff Size: Normal)   Pulse 66   Temp 97.9 F (36.6 C) (Temporal)   Resp 18   Wt 170 lb (77.1 kg)   SpO2 98%   BMI 32.12 kg/m  V/S and PE deferred, telephone visit.   LABORATORY DATA:  None for this visit.  DIAGNOSTIC IMAGING:  None for this visit.     ASSESSMENT AND PLAN:  Ms.. Balgobin is a pleasant 48 y.o. female with Stage IA right breast invasive ductal carcinoma, ER+/PR+/HER2-, diagnosed in 11/2023, treated with lumpectomy, adjuvant radiation therapy, and anti-estrogen therapy with Tamoxifen  beginning in 04/2024.   She is here for a telephone follow up. She is complaining of ongoing bone pain/leg pain which has been keeping her up at night. I asked her to continue holding tamoxifen  at this time. I also asked her to hold the statins for 2 weeks. With regards to sleep aid, she has tried OTC and we agreed to  give her a short supply of trazodone . Once she recovers from these symptoms, we have discussed  about considering OFS with [facility] to be done on Friday, ordered.  Time spent: 20 min  enters for Medicare and Medicaid Services includes, in addition to the face-to-face time of a patient visit (documented in the note above) non-face-to-face time: obtaining and reviewing outside history, ordering and reviewing medications, tests or procedures, care coordination (communications with other health care professionals or caregivers) and documentation in the medical record.

## 2024-08-11 NOTE — Progress Notes (Signed)
 Oncology History  Malignant neoplasm of upper-outer quadrant of right breast in female, estrogen receptor positive (HCC)  11/26/2023 Mammogram   Recalled from screening. Small mass within the right breast 12 o'clock position felt to correspond with the mammographically identified distortion. Targeted ultrasound is performed, showing a small irregular hypoechoic mass right breast 12 o'clock position 2 cm from the nipple measuring 6 x 9 x 5 mm.No right axillary adenopathy.       11/29/2023 Pathology Results   IDC, grade 1, The tumor cells are negative for Her2 (0).  Estrogen Receptor:  90%, POSITIVE, MODERATE STAINING INTENSITY  Progesterone Receptor:  100%, POSITIVE, STRONG STAINING INTENSITY  Proliferation Marker Ki67:  5%    12/04/2023 Initial Diagnosis   Malignant neoplasm of upper-outer quadrant of right breast in female, estrogen receptor positive (HCC)   12/08/2023 Cancer Staging   Staging form: Breast, AJCC 8th Edition - Clinical stage from 12/08/2023: Stage IA (cT1b, cN0, cM0, G1, ER+, PR+, HER2-) - Signed by [name], PA-C on 12/08/2023 Stage prefix: Initial diagnosis Method of lymph node assessment: Clinical Histologic grading system: 3 grade system   12/19/2023 Genetic Testing   Negative [facility] CancerNext+RNAinsight Panel.  Report date is 12/19/2023.   The [facility] CancerNext+RNAinsight Panel includes sequencing, rearrangement analysis, and RNA analysis for the following 39 genes: APC, ATM, BAP1, BARD1, BMPR1A, BRCA1, BRCA2, BRIP1, CDH1, CDKN2A, CHEK2, FH, FLCN, MET, MLH1, MSH2, MSH6, MUTYH, NF1, NTHL1, PALB2, PMS2, PTEN, RAD51C, RAD51D, SMAD4, STK11, TP53, TSC1, TSC2, and VHL (sequencing and deletion/duplication); AXIN2, HOXB13, MBD4, MSH3, POLD1 and POLE (sequencing only); EPCAM and GREM1 (deletion/duplication only).   12/24/2023 Surgery   Right lumpectomy: IDC, 1.8 cm, grade 1, 3 SLN negative, margins negative.    01/29/2024 - 02/25/2024 Radiation Therapy   First Treatment  Date: 2024-01-29 Last Treatment Date: 2024-02-25   Plan Name: Breast_R Site: Breast, Right Technique: 3D Mode: Photon Dose Per Fraction: 2.66 Gy Prescribed Dose (Delivered / Prescribed): 42.56 Gy / 42.56 Gy Prescribed Fxs (Delivered / Prescribed): 16 / 16   Plan Name: Breast_R_Bst Site: Breast, Right Technique: 3D Mode: Photon Dose Per Fraction: 2 Gy Prescribed Dose (Delivered / Prescribed): 8 Gy / 8 Gy Prescribed Fxs (Delivered / Prescribed): 4 / 4   04/2024 -  Anti-estrogen oral therapy   Tamoxifen  5mg  daily     History of Present Illness  Discussed the use of AI scribe software for clinical note transcription with the patient, who gave verbal consent to proceed.  History of Present Illness  Ariel Ayala is a 48 year old female with early stage breast cancer who presents with leg pain and concerns about medication side effects.  She has been experiencing leg pain for approximately five weeks. Initially, the pain was located on the top of her legs but has since shifted to her calves. She describes the pain as 'crampy' and similar to 'Charlie horses'. Despite discontinuing statins and tamoxifen , the pain persists.  She has a history of early stage breast cancer and has been undergoing treatment, including tamoxifen , which was held due to side effects. She was previously on a reduced dose of tamoxifen  (5 mg), which is the lowest dose available. She is concerned about the potential side effects of alternative treatments, including ovarian suppression and antiestrogen therapy, which could induce menopause and potentially exacerbate joint and bone pain.  She has undergone Guardant reveal as part of her ongoing monitoring and is awaiting results. She is also due for a mammogram in December, which has not  yet been scheduled.   Rest of the pertinent 10 point ROS reviewed and neg.    PAST MEDICAL/SURGICAL HISTORY:  Past Medical History:  Diagnosis Date   Asthma    as a child    Bloating 10/23/2020   CMV (cytomegalovirus infection) (HCC)    CMV (cytomegalovirus infection) (HCC)    Community acquired pneumonia 01/06/2024   Early satiety 10/23/2020   Family history of thyroid disease in mother 05/09/2017   Generalized abdominal pain 10/23/2020   Hair loss 03/12/2023   Impingement syndrome of left shoulder region 09/10/2023   Indigestion 10/23/2020   Invasive ductal carcinoma of breast, female, 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   Pain in joint of left shoulder 09/10/2023   Splenomegaly    on CT   Tenosynovitis of right hand 01/29/2023   Vitamin D  deficiency 10/25/2020   Past Surgical History:  Procedure Laterality Date   BREAST BIOPSY Right 11/28/2023   MM RT BREAST BX W LOC DEV 1ST LESION IMAGE BX SPEC STEREO GUIDE 11/28/2023 [facility]   BREAST BIOPSY  12/23/2023   MM RT RADIOACTIVE SEED LOC MAMMO GUIDE 12/23/2023 [facility]   BREAST LUMPECTOMY WITH RADIOACTIVE SEED AND SENTINEL LYMPH NODE BIOPSY Right 12/24/2023   Procedure: RIGHT BREAST LUMPECTOMY WITH RADIOACTIVE SEED AND SENTINEL LYMPH NODE BIOPSY;  Surgeon: Vernetta Berg, MD;  Location: [facility] OR;  Service: General;  Laterality: Right;   DILATION AND CURETTAGE OF UTERUS     FOOT SURGERY Right    with hardware   trigger thumb Right      ALLERGIES:  Allergies  Allergen Reactions   Sulfa Antibiotics Itching and Swelling    Throat swelling   Macrobid [Nitrofurantoin] Nausea And Vomiting and Other (See Comments)    Abdominal pain     CURRENT MEDICATIONS:  Outpatient Encounter Medications as of 08/11/2024  Medication Sig Note   albuterol  (VENTOLIN  HFA) 108 (90 Base) MCG/ACT inhaler Inhale 2 puffs into the lungs every 6 (six) hours as needed for wheezing or shortness of breath.    Cholecalciferol (VITAMIN D -3 PO) Take 1 capsule by mouth daily.    Cyanocobalamin (VITAMIN B-12 PO) Take 1 tablet by mouth daily.    ibuprofen (ADVIL) 200 MG tablet Take 400 mg by mouth 2 (two) times daily as needed for  headache or moderate pain (pain score 4-6).    Iron , Ferrous Sulfate , 325 (65 Fe) MG TABS Take 325 mg by mouth daily.    ketoconazole (NIZORAL) 2 % shampoo Apply 1 Application topically 2 (two) times a week.    Melatonin 10 MG CAPS Take 10 mg by mouth at bedtime as needed (sleep). 01/06/2024: prn   Multiple Vitamin (MULTIVITAMIN) tablet Take 1 tablet by mouth daily.    naproxen sodium (ALEVE) 220 MG tablet Take 220 mg by mouth daily as needed (pain, headache).    rosuvastatin  (CRESTOR ) 5 MG tablet TAKE 1 TABLET (5 MG TOTAL) BY MOUTH DAILY. 03/09/2024: Been off for a few months   sertraline (ZOLOFT) 25 MG tablet Take 25 mg by mouth daily.    tamoxifen  (NOLVADEX ) 10 MG tablet START BY TAKING 1/2 TABLET (5MG ) DAILY AND THEN INCREASE TO 10 MG DAILY IF TOLERATED.    traZODone  (DESYREL ) 50 MG tablet Take 1 tablet (50 mg total) by mouth at bedtime as needed for sleep.    Vitamin D , Ergocalciferol , (DRISDOL ) 1.25 MG (50000 UNIT) CAPS capsule Take 1 capsule (50,000 Units total) by mouth every 7 (seven) days.    No facility-administered encounter medications on file  as of 08/11/2024.     ONCOLOGIC FAMILY HISTORY:  Family History  Problem Relation Age of Onset   Diabetes Mother    Thyroid disease Mother    Suicidality Father    Breast cancer Maternal Grandmother        dx 45s-70s   Colon cancer Cousin        dx 63s   Skin cancer Cousin        SCC     SOCIAL HISTORY:  Social History   Socioeconomic History   Marital status: Single    Spouse name: Not on file   Number of children: Not on file   Years of education: Not on file   Highest education level: Master's degree (e.g., MA, MS, MEng, MEd, MSW, MBA)  Occupational History   Not on file  Tobacco Use   Smoking status: Never   Smokeless tobacco: Never  Vaping Use   Vaping status: Never Used  Substance and Sexual Activity   Alcohol use: Yes    Comment: 1-2/month   Drug use: No   Sexual activity: Not on file  Other Topics Concern    Not on file  Social History Narrative   Not on file   Social Drivers of Health   Financial Resource Strain: Low Risk  (05/11/2024)   Overall Financial Resource Strain (CARDIA)    Difficulty of Paying Living Expenses: Not hard at all  Food Insecurity: No Food Insecurity (05/11/2024)   Hunger Vital Sign    Worried About Running Out of Food in the Last Year: Never true    Ran Out of Food in the Last Year: Never true  Transportation Needs: No Transportation Needs (05/11/2024)   PRAPARE - Administrator, Civil Service (Medical): No    Lack of Transportation (Non-Medical): No  Physical Activity: Inactive (05/11/2024)   Exercise Vital Sign    Days of Exercise per Week: 0 days    Minutes of Exercise per Session: Not on file  Stress: No Stress Concern Present (05/11/2024)   [employer] of Occupational Health - Occupational Stress Questionnaire    Feeling of Stress: Only a little  Social Connections: Unknown (05/11/2024)   Social Connection and Isolation Panel    Frequency of Communication with Friends and Family: More than three times a week    Frequency of Social Gatherings with Friends and Family: Once a week    Attends Religious Services: Patient declined    Database administrator or Organizations: Patient declined    Attends Banker Meetings: Not on file    Marital Status: Never married  Intimate Partner Violence: Not At Risk (01/15/2024)   Humiliation, Afraid, Rape, and Kick questionnaire    Fear of Current or Ex-Partner: No    Emotionally Abused: No    Physically Abused: No    Sexually Abused: No     OBSERVATIONS/OBJECTIVE:  BP 119/72 (BP Location: Left Arm, Patient Position: Sitting, Cuff Size: Normal)   Pulse 66   Temp 97.9 F (36.6 C) (Temporal)   Resp 18   Wt 170 lb (77.1 kg)   SpO2 98%   BMI 32.12 kg/m  V/S and PE deferred,   LABORATORY DATA:  None for this visit.  DIAGNOSTIC IMAGING:  None for this visit.     ASSESSMENT AND PLAN:   Ms.. Warwick is a pleasant 48 y.o. female with Stage IA right breast invasive ductal carcinoma, ER+/PR+/HER2-, diagnosed in 11/2023, treated with lumpectomy, adjuvant radiation therapy, and anti-estrogen  therapy with Tamoxifen  beginning in 04/2024.   Assessment and Plan Assessment & Plan Early stage breast cancer, status post treatment - Discussed ovarian suppression and antiestrogen therapy, as an alternative to tamoxifen . She is hesitant due to side effects. - She is wondering if she can do intensive screening since she may not do any more antiestrogen therapy. MRI ordered for summer, mammo planned for January - Continue Guardant Reveal testing every six months. - Discuss option of trying a different manufacturer of tamoxifen  with potentially fewer side effects.  Leg pain and muscle cramps Leg pain and cramps persist beyond expected resolution time after statin discontinuation. Pain shifted from thighs to calves, resembling cramps.  I dont believe this is related to tamoxifen  She will continue to monitor this and report to us  or PCP  Time spent: 30 min   *Total Encounter Time as defined by the Centers for Medicare and Medicaid Services includes, in addition to the face-to-face time of a patient visit (documented in the note above) non-face-to-face time: obtaining and reviewing outside history, ordering and reviewing medications, tests or procedures, care coordination (communications with other health care professionals or caregivers) and documentation in the medical record.

## 2024-08-12 LAB — CBC WITH DIFFERENTIAL/PLATELET
Basophils Absolute: 0 x10E3/uL (ref 0.0–0.2)
Basos: 0 %
EOS (ABSOLUTE): 0.2 x10E3/uL (ref 0.0–0.4)
Eos: 2 %
Hematocrit: 37.2 % (ref 34.0–46.6)
Hemoglobin: 11.7 g/dL (ref 11.1–15.9)
Immature Grans (Abs): 0 x10E3/uL (ref 0.0–0.1)
Immature Granulocytes: 0 %
Lymphocytes Absolute: 1.8 x10E3/uL (ref 0.7–3.1)
Lymphs: 24 %
MCH: 27.1 pg (ref 26.6–33.0)
MCHC: 31.5 g/dL (ref 31.5–35.7)
MCV: 86 fL (ref 79–97)
Monocytes Absolute: 0.5 x10E3/uL (ref 0.1–0.9)
Monocytes: 6 %
Neutrophils Absolute: 5 x10E3/uL (ref 1.4–7.0)
Neutrophils: 68 %
Platelets: 350 x10E3/uL (ref 150–450)
RBC: 4.31 x10E6/uL (ref 3.77–5.28)
RDW: 14.5 % (ref 11.7–15.4)
WBC: 7.4 x10E3/uL (ref 3.4–10.8)

## 2024-08-12 LAB — IRON,TIBC AND FERRITIN PANEL
Ferritin: 8 ng/mL — AB (ref 15–150)
Iron Saturation: 11 % — AB (ref 15–55)
Iron: 43 ug/dL (ref 27–159)
Total Iron Binding Capacity: 401 ug/dL (ref 250–450)
UIBC: 358 ug/dL (ref 131–425)

## 2024-08-17 ENCOUNTER — Telehealth: Payer: Self-pay | Admitting: *Deleted

## 2024-08-17 NOTE — Telephone Encounter (Signed)
 Called pt to give negative Guardant results. Phone number on file is a non-working number.

## 2024-08-18 ENCOUNTER — Ambulatory Visit: Payer: Self-pay | Admitting: Nurse Practitioner

## 2024-08-19 ENCOUNTER — Encounter: Payer: Self-pay | Admitting: Adult Health

## 2024-08-20 ENCOUNTER — Other Ambulatory Visit: Payer: Self-pay | Admitting: Nurse Practitioner

## 2024-08-20 NOTE — Telephone Encounter (Signed)
 Iron  infusions are generally very well tolerated. I typically defer to insurance coverage if we will start with a one time infusion and then continue with the oral iron  while monitoring for stability of the iron  levels versus more than one iron  infusion then restarting oral iron .   Side effects can include: Metallic taste during infusion Headache Dizziness/lightheadedness (mild) Flushing or warm sensation Mild rash at injection site Mild blood pressure changes  If reactions are going to occur they are usually during the infusion or within the 24-48 hours following.   Iron  is typically infused at a slow rate, which helps reduce side effects.   As with any medication there can be serious side effects such as allergic reaction with swelling, difficulty breathing, wide spread rash, rapid heart rate, drop in blood pressure, or pain/swelling at the injection site. These are all very rare and monitored for closely at the infusion center.  I hope this helps! Once we place the order we will know if your insurance will cover the one time infusion or if two + infusions are preferred.

## 2024-08-27 ENCOUNTER — Telehealth (HOSPITAL_COMMUNITY): Payer: Self-pay | Admitting: Nurse Practitioner

## 2024-08-27 ENCOUNTER — Other Ambulatory Visit (HOSPITAL_COMMUNITY): Payer: Self-pay | Admitting: Nurse Practitioner

## 2024-08-27 NOTE — Telephone Encounter (Addendum)
 Patient referred to infusion pharmacy team for ambulatory infusion of IV iron .  Insurance - Insurance underwriter of care - Site of care: [facility] Dx code - E61.1 IV Iron  Therapy - Venofer 300 mg x 1. Provider will determine if additional IV iron  therapy is needed. Infusion appointments - Scheduling team will schedule patient as soon as possible.   Thank you,  Norton Blush, PharmD, BCSCP Pharmacist [facility]

## 2024-08-30 ENCOUNTER — Other Ambulatory Visit (HOSPITAL_COMMUNITY): Payer: Self-pay | Admitting: Nurse Practitioner

## 2024-08-30 ENCOUNTER — Encounter (HOSPITAL_COMMUNITY): Payer: Self-pay

## 2024-08-30 ENCOUNTER — Telehealth (HOSPITAL_COMMUNITY): Payer: Self-pay | Admitting: Pharmacy Technician

## 2024-08-30 NOTE — Telephone Encounter (Signed)
 Auth Submission: NO AUTH NEEDED Site of care: [facility] Payer: [employer] Medication & CPT/J Code(s) submitted: Venofer (Iron  Sucrose) J1756 Diagnosis Code: E61.1 Route of submission (phone, fax, portal):  Phone # Fax # Auth type: Buy/Bill HB Units/visits requested: 300mg  x 1 dose Reference number: [id number] Approval from: 08/30/24 to 11/17/24    Dagoberto Armour, CPhT [facility] Phone: [phone] 08/30/2024

## 2024-08-31 ENCOUNTER — Encounter: Payer: Self-pay | Admitting: Hematology and Oncology

## 2024-08-31 ENCOUNTER — Other Ambulatory Visit: Payer: Self-pay

## 2024-08-31 ENCOUNTER — Inpatient Hospital Stay: Attending: Hematology and Oncology | Admitting: Licensed Clinical Social Worker

## 2024-08-31 DIAGNOSIS — Z800 Family history of malignant neoplasm of digestive organs: Secondary | ICD-10-CM | POA: Insufficient documentation

## 2024-08-31 DIAGNOSIS — C50411 Malignant neoplasm of upper-outer quadrant of right female breast: Secondary | ICD-10-CM | POA: Insufficient documentation

## 2024-08-31 DIAGNOSIS — N61 Mastitis without abscess: Secondary | ICD-10-CM | POA: Insufficient documentation

## 2024-08-31 DIAGNOSIS — Z803 Family history of malignant neoplasm of breast: Secondary | ICD-10-CM | POA: Insufficient documentation

## 2024-08-31 DIAGNOSIS — Z808 Family history of malignant neoplasm of other organs or systems: Secondary | ICD-10-CM | POA: Insufficient documentation

## 2024-08-31 DIAGNOSIS — Z1732 Human epidermal growth factor receptor 2 negative status: Secondary | ICD-10-CM | POA: Insufficient documentation

## 2024-08-31 DIAGNOSIS — Z1721 Progesterone receptor positive status: Secondary | ICD-10-CM | POA: Insufficient documentation

## 2024-08-31 DIAGNOSIS — Z170 Estrogen receptor positive status [ER+]: Secondary | ICD-10-CM | POA: Insufficient documentation

## 2024-08-31 MED ORDER — DOXYCYCLINE HYCLATE 100 MG PO TABS
100.0000 mg | ORAL_TABLET | Freq: Two times a day (BID) | ORAL | 0 refills | Status: AC
Start: 2024-08-31 — End: 2024-09-10

## 2024-08-31 NOTE — Progress Notes (Signed)
 [facility] CSW Counseling Note  Patient was referred by self. Treatment type: Individual  Presenting Concerns: Patient and/or family reports the following symptoms/concerns: pain; adjustment to cancer Duration of problem: 1 month; Severity of problem: moderate   Orientation:oriented to person, place, time/date, and situation.   Affect: Appropriate and Congruent Risk of harm to self or others: No plan to harm self or others  Patient and/or Family's Strengths/Protective Factors: Social connections, Social and Emotional competence, and Concrete supports in place (healthy food, safe environments, etc.)Ability for insight  Capable of independent living  Communication skills  Motivation for treatment/growth  Supportive family/friends      Goals Addressed: Patient will:  Increase ability to cope with pain and uncertainty with cancer treatments and recovery   Progress towards Goals: Progressing   Interventions: Interventions utilized:  CBT      Assessment: Patient is currently experiencing nipple pain and issues combined with some fatigue, making today difficult. She is also dealing with life stressors with determining finances and college applications with her daughter. Discussed tracking energy with a simple likert scale over time to be able to recognize progress more easily.  Utilized reframe to support patient in efforts to be happy/ joyful now versus focusing on I'll be good when....  Care and attention to noticing triggers and that October can be a difficult month for breast cancer survivors due to the abundance of cancer imagery/ walks/ information.      Plan: Follow up with CSW: 1 month Behavioral recommendations: Break down tasks into what is achievable. Track energy/fatigue with a simple 1-5 or 1-10 scale to notice improvement over time.  Referral(s): Support group.  FYNN class September-October       Julie-Anne Torain E Kima Malenfant, LCSW

## 2024-09-01 ENCOUNTER — Emergency Department (HOSPITAL_COMMUNITY)
Admission: EM | Admit: 2024-09-01 | Discharge: 2024-09-01 | Disposition: A | Discharge status: Home / Self Care | Attending: Emergency Medicine | Admitting: Emergency Medicine

## 2024-09-01 ENCOUNTER — Encounter (HOSPITAL_COMMUNITY): Payer: Self-pay | Admitting: Emergency Medicine

## 2024-09-01 ENCOUNTER — Other Ambulatory Visit: Payer: Self-pay

## 2024-09-01 DIAGNOSIS — N61 Mastitis without abscess: Secondary | ICD-10-CM | POA: Insufficient documentation

## 2024-09-01 DIAGNOSIS — N644 Mastodynia: Secondary | ICD-10-CM | POA: Diagnosis present

## 2024-09-01 LAB — CBC WITH DIFFERENTIAL/PLATELET
Abs Immature Granulocytes: 0.03 K/uL (ref 0.00–0.07)
Basophils Absolute: 0 K/uL (ref 0.0–0.1)
Basophils Relative: 0 %
Eosinophils Absolute: 0.3 K/uL (ref 0.0–0.5)
Eosinophils Relative: 3 %
HCT: 41.1 % (ref 36.0–46.0)
Hemoglobin: 13.2 g/dL (ref 12.0–15.0)
Immature Granulocytes: 0 %
Lymphocytes Relative: 28 %
Lymphs Abs: 2.3 K/uL (ref 0.7–4.0)
MCH: 27.3 pg (ref 26.0–34.0)
MCHC: 32.1 g/dL (ref 30.0–36.0)
MCV: 84.9 fL (ref 80.0–100.0)
Monocytes Absolute: 0.5 K/uL (ref 0.1–1.0)
Monocytes Relative: 6 %
Neutro Abs: 5.1 K/uL (ref 1.7–7.7)
Neutrophils Relative %: 63 %
Platelets: 397 K/uL (ref 150–400)
RBC: 4.84 MIL/uL (ref 3.87–5.11)
RDW: 14.2 % (ref 11.5–15.5)
WBC: 8.2 K/uL (ref 4.0–10.5)
nRBC: 0 % (ref 0.0–0.2)

## 2024-09-01 LAB — BASIC METABOLIC PANEL WITH GFR
Anion gap: 15 (ref 5–15)
BUN: 14 mg/dL (ref 6–20)
CO2: 25 mmol/L (ref 22–32)
Calcium: 9.1 mg/dL (ref 8.9–10.3)
Chloride: 103 mmol/L (ref 98–111)
Creatinine, Ser: 0.67 mg/dL (ref 0.44–1.00)
GFR, Estimated: >60 mL/min (ref >60)
Glucose, Bld: 107 mg/dL — ABNORMAL HIGH (ref 70–99)
Potassium: 3.5 mmol/L (ref 3.5–5.1)
Sodium: 144 mmol/L (ref 135–145)

## 2024-09-01 LAB — I-STAT CG4 LACTIC ACID, ED: Lactic Acid, Venous: 1.7 mmol/L (ref 0.5–1.9)

## 2024-09-01 MED ORDER — DOXYCYCLINE HYCLATE 100 MG PO TABS
100.0000 mg | ORAL_TABLET | Freq: Once | ORAL | Status: AC
  Administered 2024-09-01: 100 mg via ORAL
  Filled 2024-09-01: qty 1

## 2024-09-01 NOTE — ED Notes (Signed)
 onc   Jalesa Thien, PA-C 09/01/24 8040

## 2024-09-01 NOTE — ED Triage Notes (Signed)
 Pt reports right breast redness and swollen areola. Pt reports her oncologist told her to come to the ER for cellulitis on the breast.

## 2024-09-01 NOTE — Discharge Instructions (Addendum)
 Your laboratory results were within normal limits today.  Continue taking your doxycycline.  Experiencing fever, chills, other complaints you will need to return to the emergency department.

## 2024-09-01 NOTE — ED Provider Notes (Signed)
 [place] EMERGENCY DEPARTMENT AT [facility] Provider Note   CSN: [id number] Arrival date & time: 09/01/24  1750     Patient presents with: Breast Pain   Ariel Ayala is a 48 y.o. female.   48 y.o female with a PMH of lumpectomy in February of 2025 presents to the ED with a chief complaint of right breast redness and pain which began 3 days ago.  She reports she had a visit with her oncologist sent them pictures, they prescribed her some doxycycline to start yesterday.  She took a dose last night and a dose this morning.  Today, she reports she messaged her oncologist once again, she sent them pictures and they told her to come to the emergency department for IV antibiotics and admission.  She reports noticing the areola to be more swollen and red this morning, however now it has subsided some.  But now she is concerned that the white part is more indurated than it was before.  She is not having any discharge from her nipple, there is no fever, no chills.  The history is provided by the patient.       Prior to Admission medications   Medication Sig Start Date End Date Taking? Authorizing Provider  albuterol  (VENTOLIN  HFA) 108 (90 Base) MCG/ACT inhaler Inhale 2 puffs into the lungs every 6 (six) hours as needed for wheezing or shortness of breath. 03/01/24   Walisiewicz, Kaitlyn E, PA-C  Cholecalciferol (VITAMIN D -3 PO) Take 1 capsule by mouth daily.    [provider]  Cyanocobalamin (VITAMIN B-12 PO) Take 1 tablet by mouth daily.    [provider]  doxycycline (VIBRA-TABS) 100 MG tablet Take 1 tablet (100 mg total) by mouth 2 (two) times daily for 10 days. 08/31/24 09/10/24  Iruku, Praveena, MD  ibuprofen (ADVIL) 200 MG tablet Take 400 mg by mouth 2 (two) times daily as needed for headache or moderate pain (pain score 4-6).    [provider]  Iron , Ferrous Sulfate , 325 (65 Fe) MG TABS Take 325 mg by mouth daily. 03/17/24   Early, Sara E, NP   ketoconazole (NIZORAL) 2 % shampoo Apply 1 Application topically 2 (two) times a week. 07/25/24   [provider]  Melatonin 10 MG CAPS Take 10 mg by mouth at bedtime as needed (sleep).    [provider]  Multiple Vitamin (MULTIVITAMIN) tablet Take 1 tablet by mouth daily.    [provider]  naproxen sodium (ALEVE) 220 MG tablet Take 220 mg by mouth daily as needed (pain, headache).    [provider]  rosuvastatin  (CRESTOR ) 5 MG tablet TAKE 1 TABLET (5 MG TOTAL) BY MOUTH DAILY. 08/20/24   Early, Sara E, NP  sertraline (ZOLOFT) 25 MG tablet Take 25 mg by mouth daily.    [provider]  tamoxifen  (NOLVADEX ) 10 MG tablet START BY TAKING 1/2 TABLET (5MG ) DAILY AND THEN INCREASE TO 10 MG DAILY IF TOLERATED. 06/21/24   Iruku, Praveena, MD  traZODone  (DESYREL ) 50 MG tablet Take 1 tablet (50 mg total) by mouth at bedtime as needed for sleep. 07/28/24   Iruku, Praveena, MD  Vitamin D , Ergocalciferol , (DRISDOL ) 1.25 MG (50000 UNIT) CAPS capsule Take 1 capsule (50,000 Units total) by mouth every 7 (seven) days. 03/17/24   Early, Sara E, NP    Allergies: Sulfa antibiotics and Macrobid [nitrofurantoin]    Review of Systems  Constitutional:  Negative for chills and fever.  Respiratory:  Negative for  shortness of breath.   Cardiovascular:  Negative for chest pain.  Gastrointestinal:  Negative for abdominal pain, nausea and vomiting.  Genitourinary:  Negative for dysuria.  Musculoskeletal:  Negative for back pain.  All other systems reviewed and are negative.   Updated Vital Signs BP 122/86 (BP Location: Left Arm)   Pulse 79   Temp 97.9 F (36.6 C)   Resp 16   SpO2 100%   Physical Exam Vitals and nursing note reviewed. Exam conducted with a chaperone present.  Constitutional:      Appearance: Normal appearance.  HENT:     Head: Normocephalic and atraumatic.  Eyes:     Pupils: Pupils are equal, round, and reactive to light.  Cardiovascular:     Rate  and Rhythm: Normal rate.  Pulmonary:     Effort: Pulmonary effort is normal.  Chest:  Breasts:    Right: Skin change and tenderness present. No swelling, bleeding, inverted nipple, mass or nipple discharge.     Comments: Right nipple with some mild erythema, some induration noted and tenderness to palpation.  No nipple discharge no inverted nipple.  Abdominal:     General: Abdomen is flat.  Musculoskeletal:     Cervical back: Normal range of motion and neck supple.  Skin:    General: Skin is warm and dry.  Neurological:     Mental Status: She is alert and oriented to person, place, and time.     (all labs ordered are listed, but only abnormal results are displayed) Labs Reviewed  BASIC METABOLIC PANEL WITH GFR - Abnormal; Notable for the following components:      Result Value   Glucose, Bld 107 (*)    All other components within normal limits  CBC WITH DIFFERENTIAL/PLATELET  I-STAT CG4 LACTIC ACID, ED    EKG: None  Radiology: No results found.   Procedures   Medications Ordered in the ED  doxycycline (VIBRA-TABS) tablet 100 mg (100 mg Oral Given 09/01/24 2007)                                    Medical Decision Making Amount and/or Complexity of Data Reviewed Labs: ordered.  Risk Prescription drug management.    This patient presents to the ED for concern of breast pain, this involves a number of treatment options, and is a complaint that carries with it a high risk of complications and morbidity.  The differential diagnosis includes mastitis, cellulitis, abscess.   Co morbidities: Discussed in HPI   Brief History:  See HPI.  EMR reviewed including pt PMHx, past surgical history and past visits to ER.   See HPI for more details  Lab Tests:  I ordered and independently interpreted labs.  The pertinent results include:    CBC with no leukocytosis, hemoglobin is within normal limits.  BMP without any Electra derangement, creatinine levels  unremarkable.  Lactic acid is 1.7  Imaging Studies:  No imaging studies ordered for this patient  Medicines ordered:  I ordered medication including doxycycline for cellulitis Reevaluation of the patient after these medicines showed that the patient stayed the same I have reviewed the patients home medicines and have made adjustments as needed  Consults:  I requested consultation with Dr. Loretha,  and discussed lab and imaging findings as well as pertinent plan - they recommend: continue to take doxycycline, patient will be called by one of their APP's in order  to have appointment by Friday for a recheck.  Reevaluation:  After the interventions noted above I re-evaluated patient and found that they have :stayed the same  Social Determinants of Health:  The patient's social determinants of health were a factor in the care of this patient  Problem List / ED Course:  Presents to ED with chief complaint of right breast pain which has been ongoing for the past 3 days, she did have her oncologist Dr. Loretha reports provided her with a prescription for doxycycline, she took 1 tablet last night, another tablet this morning.  She reports that she felt that the look worse this morning, therefore she called again and was told to come to the emergency department for IV antibiotics.  She is overall nontoxic-appearing, afebrile on arrival.  Blood work obtained did not show any white count, electrolytes are within normal limits.  Lactic acid was added. On evaluation there is mild erythema, some induration to the skin but no palpable abscess appreciated.  She is concerned due to the discoloration of the nipple.  I went ahead and added a lactic acid for further check.  I will touch base with oncology for their recommendations although that I feel patient has not failed outpatient treatment at this time. Spoke to oncology and discussed plan along with findings.  Patient without any signs of sepsis at this  time, will continue outpatient antibiotic.  She will be rechecked by APP on Friday. Patient is agreeable to this, lactic acid is reassuring.  Patient is hemodynamically stable for discharge.   Dispostion:  After consideration of the diagnostic results and the patients response to treatment, I feel that the patent would benefit from close follow-up with oncologist, will return if symptoms worsen.    Portions of this note were generated with Scientist, clinical (histocompatibility and immunogenetics). Dictation errors may occur despite best attempts at proofreading.   Final diagnoses:  Cellulitis of right breast    ED Discharge Orders     None          Maureen Broad, PA-C 09/01/24 2019    Dreama Longs, MD 09/02/24 1408

## 2024-09-01 NOTE — ED Provider Notes (Incomplete)
 [place] EMERGENCY DEPARTMENT AT [facility] Provider Note   CSN: [id number] Arrival date & time: 09/01/24  1750     Patient presents with: Breast Pain   Ariel Ayala is a 48 y.o. female.  {Add pertinent medical, surgical, social history, OB history to HPI:32947} Two doses of doxyclycline Right breast No drainge from the nipple and raw  Areola is hard  Worse is earlier today?  -fever,   The history is provided by the patient.       Prior to Admission medications   Medication Sig Start Date End Date Taking? Authorizing Provider  albuterol  (VENTOLIN  HFA) 108 (90 Base) MCG/ACT inhaler Inhale 2 puffs into the lungs every 6 (six) hours as needed for wheezing or shortness of breath. 03/01/24   Walisiewicz, Kaitlyn E, PA-C  Cholecalciferol (VITAMIN D -3 PO) Take 1 capsule by mouth daily.    [provider]  Cyanocobalamin (VITAMIN B-12 PO) Take 1 tablet by mouth daily.    [provider]  doxycycline (VIBRA-TABS) 100 MG tablet Take 1 tablet (100 mg total) by mouth 2 (two) times daily for 10 days. 08/31/24 09/10/24  Iruku, Praveena, MD  ibuprofen (ADVIL) 200 MG tablet Take 400 mg by mouth 2 (two) times daily as needed for headache or moderate pain (pain score 4-6).    [provider]  Iron , Ferrous Sulfate , 325 (65 Fe) MG TABS Take 325 mg by mouth daily. 03/17/24   Early, Sara E, NP  ketoconazole (NIZORAL) 2 % shampoo Apply 1 Application topically 2 (two) times a week. 07/25/24   [provider]  Melatonin 10 MG CAPS Take 10 mg by mouth at bedtime as needed (sleep).    [provider]  Multiple Vitamin (MULTIVITAMIN) tablet Take 1 tablet by mouth daily.    [provider]  naproxen sodium (ALEVE) 220 MG tablet Take 220 mg by mouth daily as needed (pain, headache).    [provider]  rosuvastatin  (CRESTOR ) 5 MG tablet TAKE 1 TABLET (5 MG TOTAL) BY MOUTH DAILY. 08/20/24   Early, Sara E, NP  sertraline (ZOLOFT)  25 MG tablet Take 25 mg by mouth daily.    [provider]  tamoxifen  (NOLVADEX ) 10 MG tablet START BY TAKING 1/2 TABLET (5MG ) DAILY AND THEN INCREASE TO 10 MG DAILY IF TOLERATED. 06/21/24   Iruku, Praveena, MD  traZODone  (DESYREL ) 50 MG tablet Take 1 tablet (50 mg total) by mouth at bedtime as needed for sleep. 07/28/24   Iruku, Praveena, MD  Vitamin D , Ergocalciferol , (DRISDOL ) 1.25 MG (50000 UNIT) CAPS capsule Take 1 capsule (50,000 Units total) by mouth every 7 (seven) days. 03/17/24   Early, Sara E, NP    Allergies: Sulfa antibiotics and Macrobid [nitrofurantoin]    Review of Systems  Updated Vital Signs BP (!) 147/86 (BP Location: Left Arm)   Pulse 91   Temp 98.3 F (36.8 C) (Oral)   Resp 18   SpO2 100%   Physical Exam  (all labs ordered are listed, but only abnormal results are displayed) Labs Reviewed  BASIC METABOLIC PANEL WITH GFR - Abnormal; Notable for the following components:      Result Value   Glucose, Bld 107 (*)    All other components within normal limits  CBC WITH DIFFERENTIAL/PLATELET    EKG: None  Radiology: No results found.  {Document cardiac monitor, telemetry assessment procedure when appropriate:32947} Procedures   Medications Ordered in the ED - No data to display    {Click here  for ABCD2, HEART and other calculators REFRESH Note before signing:1}                              Medical Decision Making Amount and/or Complexity of Data Reviewed Labs: ordered.   ***  {Document critical care time when appropriate  Document review of labs and clinical decision tools ie CHADS2VASC2, etc  Document your independent review of radiology images and any outside records  Document your discussion with family members, caretakers and with consultants  Document social determinants of health affecting pt's care  Document your decision making why or why not admission, treatments were needed:32947:::1}   Final diagnoses:  None    ED Discharge  Orders     None

## 2024-09-03 ENCOUNTER — Inpatient Hospital Stay (HOSPITAL_BASED_OUTPATIENT_CLINIC_OR_DEPARTMENT_OTHER): Admitting: Adult Health

## 2024-09-03 ENCOUNTER — Encounter: Payer: Self-pay | Admitting: Adult Health

## 2024-09-03 ENCOUNTER — Other Ambulatory Visit: Payer: Self-pay | Admitting: Adult Health

## 2024-09-03 VITALS — BP 115/68 | HR 90 | Temp 98.0°F | Resp 16 | Wt 171.3 lb

## 2024-09-03 DIAGNOSIS — Z1732 Human epidermal growth factor receptor 2 negative status: Secondary | ICD-10-CM | POA: Diagnosis not present

## 2024-09-03 DIAGNOSIS — C50411 Malignant neoplasm of upper-outer quadrant of right female breast: Secondary | ICD-10-CM | POA: Diagnosis present

## 2024-09-03 DIAGNOSIS — Z1721 Progesterone receptor positive status: Secondary | ICD-10-CM | POA: Diagnosis not present

## 2024-09-03 DIAGNOSIS — Z803 Family history of malignant neoplasm of breast: Secondary | ICD-10-CM | POA: Diagnosis not present

## 2024-09-03 DIAGNOSIS — N61 Mastitis without abscess: Secondary | ICD-10-CM | POA: Diagnosis not present

## 2024-09-03 DIAGNOSIS — Z808 Family history of malignant neoplasm of other organs or systems: Secondary | ICD-10-CM | POA: Diagnosis not present

## 2024-09-03 DIAGNOSIS — Z170 Estrogen receptor positive status [ER+]: Secondary | ICD-10-CM | POA: Diagnosis not present

## 2024-09-03 DIAGNOSIS — Z800 Family history of malignant neoplasm of digestive organs: Secondary | ICD-10-CM | POA: Diagnosis not present

## 2024-09-03 MED ORDER — ONDANSETRON 4 MG PO TBDP: 4.0000 mg | ORAL_TABLET | Freq: Three times a day (TID) | ORAL | 0 refills | Status: AC | PRN

## 2024-09-03 MED ORDER — FLUCONAZOLE 200 MG PO TABS: 200.0000 mg | ORAL_TABLET | Freq: Once | ORAL | 1 refills | Status: AC

## 2024-09-03 MED ORDER — AMOXICILLIN-POT CLAVULANATE 875-125 MG PO TABS: 1.0000 | ORAL_TABLET | Freq: Two times a day (BID) | ORAL | 0 refills | Status: DC

## 2024-09-03 NOTE — Progress Notes (Signed)
 [facility] Cancer Follow up:    Ariel Camie BRAVO, NP [address] [place] [address]   DIAGNOSIS: Cancer Staging  Malignant neoplasm of upper-outer quadrant of right breast in female, estrogen receptor positive (HCC) Staging form: Breast, AJCC 8th Edition - Clinical stage from 12/08/2023: Stage IA (cT1b, cN0, cM0, G1, ER+, PR+, HER2-) - Signed by [name], PA-C on 12/08/2023 Stage prefix: Initial diagnosis Method of lymph node assessment: Clinical Histologic grading system: 3 grade system    SUMMARY OF ONCOLOGIC HISTORY: Oncology History  Malignant neoplasm of upper-outer quadrant of right breast in female, estrogen receptor positive (HCC)  11/26/2023 Mammogram   Recalled from screening. Small mass within the right breast 12 o'clock position felt to correspond with the mammographically identified distortion. Targeted ultrasound is performed, showing a small irregular hypoechoic mass right breast 12 o'clock position 2 cm from the nipple measuring 6 x 9 x 5 mm.No right axillary adenopathy.       11/29/2023 Pathology Results   IDC, grade 1, The tumor cells are negative for Her2 (0).  Estrogen Receptor:  90%, POSITIVE, MODERATE STAINING INTENSITY  Progesterone Receptor:  100%, POSITIVE, STRONG STAINING INTENSITY  Proliferation Marker Ki67:  5%    12/04/2023 Initial Diagnosis   Malignant neoplasm of upper-outer quadrant of right breast in female, estrogen receptor positive (HCC)   12/08/2023 Cancer Staging   Staging form: Breast, AJCC 8th Edition - Clinical stage from 12/08/2023: Stage IA (cT1b, cN0, cM0, G1, ER+, PR+, HER2-) - Signed by [name], PA-C on 12/08/2023 Stage prefix: Initial diagnosis Method of lymph node assessment: Clinical Histologic grading system: 3 grade system   12/19/2023 Genetic Testing   Negative [facility] CancerNext+RNAinsight Panel.  Report date is 12/19/2023.   The [facility] CancerNext+RNAinsight Panel includes  sequencing, rearrangement analysis, and RNA analysis for the following 39 genes: APC, ATM, BAP1, BARD1, BMPR1A, BRCA1, BRCA2, BRIP1, CDH1, CDKN2A, CHEK2, FH, FLCN, MET, MLH1, MSH2, MSH6, MUTYH, NF1, NTHL1, PALB2, PMS2, PTEN, RAD51C, RAD51D, SMAD4, STK11, TP53, TSC1, TSC2, and VHL (sequencing and deletion/duplication); AXIN2, HOXB13, MBD4, MSH3, POLD1 and POLE (sequencing only); EPCAM and GREM1 (deletion/duplication only).   12/24/2023 Surgery   Right lumpectomy: IDC, 1.8 cm, grade 1, 3 SLN negative, margins negative.    01/29/2024 - 02/25/2024 Radiation Therapy   First Treatment Date: 2024-01-29 Last Treatment Date: 2024-02-25   Plan Name: Breast_R Site: Breast, Right Technique: 3D Mode: Photon Dose Per Fraction: 2.66 Gy Prescribed Dose (Delivered / Prescribed): 42.56 Gy / 42.56 Gy Prescribed Fxs (Delivered / Prescribed): 16 / 16   Plan Name: Breast_R_Bst Site: Breast, Right Technique: 3D Mode: Photon Dose Per Fraction: 2 Gy Prescribed Dose (Delivered / Prescribed): 8 Gy / 8 Gy Prescribed Fxs (Delivered / Prescribed): 4 / 4   04/2024 -  Anti-estrogen oral therapy   Tamoxifen  5mg  daily     CURRENT THERAPY: Tamoxifen  daily  INTERVAL HISTORY: Discussed the use of AI scribe software for clinical note transcription with the patient, who gave verbal consent to proceed.  History of Present Illness Ariel Ayala is a 48 year old female with breast cancer who presents with symptoms of breast cellulitis.  She was diagnosed with invasive ductal carcinoma, stage 1A, ER, PR positive breast cancer in January 2025. She underwent a lumpectomy followed by adjuvant radiation and began tamoxifen  in June 2025.  Symptoms of breast cellulitis began on Monday, with the right breast appearing bright pink and swollen compared to the left, with significant soreness on the  top. She started doxycycline on Tuesday night after sending pictures to her nurse, who advised her to visit the ER. At the ER on  Wednesday night, lab work was done, but no further treatment was initiated. She has been on doxycycline since then with no improvement.  She has a known allergy to sulfa drugs and poor reactions to certain antibiotics. Augmentin  previously caused  gastrointestinal distress, including diarrhea severe enough to result in hemorrhoids, and a yeast infection.  Her areola periodically becomes hard, white, and puffed out. She has been icing the area to manage symptoms. No fever, high heart rate, or lightheadedness.     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   Adenomyosis of uterus 08/01/2023   Iron  deficiency 07/02/2023   Mixed hyperlipidemia 07/02/2023   Perimenopause 03/12/2023   Osteoarthritis of carpometacarpal (CMC) joint of thumb 01/29/2023   Adhesive capsulitis of right shoulder 08/01/2022   Body mass index (BMI) of 26.0-26.9 in adult 04/02/2022   Anxiety 04/01/2022   Primary insomnia 04/01/2022   Abnormal cervical Papanicolaou smear 11/06/2021   Genital herpes simplex 03/13/2021   Vitamin D  deficiency 10/25/2020   Vegetarian diet 10/23/2020   Encounter for annual physical exam 10/23/2020   TMJ tenderness, bilateral 02/11/2020   Seasonal allergies 02/11/2020   Elevated TSH 05/09/2017   Splenomegaly 04/16/2017   Asthma 11/12/2016    is allergic to sulfa antibiotics and macrobid [nitrofurantoin].  MEDICAL HISTORY: Past Medical History:  Diagnosis Date   Asthma    as a child   Bloating 10/23/2020    CMV (cytomegalovirus infection) (HCC)    CMV (cytomegalovirus infection) (HCC)    Community acquired pneumonia 01/06/2024   Early satiety 10/23/2020   Family history of thyroid disease in mother 05/09/2017   Generalized abdominal pain 10/23/2020   Hair loss 03/12/2023   Impingement syndrome of left shoulder region 09/10/2023   Indigestion 10/23/2020   Invasive ductal carcinoma of breast, female, 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   Pain in joint of left shoulder 09/10/2023   Splenomegaly    on CT   Tenosynovitis of right hand 01/29/2023   Vitamin D  deficiency 10/25/2020    SURGICAL HISTORY: Past Surgical History:  Procedure Laterality Date   BREAST BIOPSY Right 11/28/2023   MM RT BREAST BX W LOC DEV 1ST LESION IMAGE BX SPEC STEREO GUIDE 11/28/2023 [facility]MO[facility]   BREAST BIOPSY  12/23/2023   MM RT RADIOACTIVE SEED LOC MAMMO GUIDE 12/23/2023 [facility]AMMO[facility]   BREAST LUMPECTOMY WITH RADIOACTIVE SEED AND SENTINEL LYMPH NODE BIOPSY Right 12/24/2023   Procedure: RIGHT BREAST LUMPECTOMY WITH RADIOACTIVE SEED AND SENTINEL LYMPH NODE BIOPSY;  Surgeon: Vernetta Berg, MD;  Location: [facility] OR;  Service: General;  Laterality: Right;   DILATION AND CURETTAGE OF UTERUS     FOOT SURGERY Right    with hardware   trigger thumb Right     SOCIAL HISTORY: Social History   Socioeconomic History   Marital status: Single    Spouse name: Not on file   Number of children: Not on file   Years of education: Not  on file   Highest education level: Master's degree (e.g., MA, MS, MEng, MEd, MSW, MBA)  Occupational History   Not on file  Tobacco Use   Smoking status: Never   Smokeless tobacco: Never  Vaping Use   Vaping status: Never Used  Substance and Sexual Activity   Alcohol use: Yes    Comment: 1-2/month   Drug use: No   Sexual activity: Not on file  Other Topics Concern   Not on file  Social History Narrative   Not on file   Social Drivers of Health   Financial Resource Strain: Low Risk   (05/11/2024)   Overall Financial Resource Strain (CARDIA)    Difficulty of Paying Living Expenses: Not hard at all  Food Insecurity: No Food Insecurity (05/11/2024)   Hunger Vital Sign    Worried About Running Out of Food in the Last Year: Never true    Ran Out of Food in the Last Year: Never true  Transportation Needs: No Transportation Needs (05/11/2024)   PRAPARE - Administrator, Civil Service (Medical): No    Lack of Transportation (Non-Medical): No  Physical Activity: Inactive (05/11/2024)   Exercise Vital Sign    Days of Exercise per Week: 0 days    Minutes of Exercise per Session: Not on file  Stress: No Stress Concern Present (05/11/2024)   [employer] of Occupational Health - Occupational Stress Questionnaire    Feeling of Stress: Only a little  Social Connections: Unknown (05/11/2024)   Social Connection and Isolation Panel    Frequency of Communication with Friends and Family: More than three times a week    Frequency of Social Gatherings with Friends and Family: Once a week    Attends Religious Services: Patient declined    Database administrator or Organizations: Patient declined    Attends Banker Meetings: Not on file    Marital Status: Never married  Intimate Partner Violence: Not At Risk (01/15/2024)   Humiliation, Afraid, Rape, and Kick questionnaire    Fear of Current or Ex-Partner: No    Emotionally Abused: No    Physically Abused: No    Sexually Abused: No    FAMILY HISTORY: Family History  Problem Relation Age of Onset   Diabetes Mother    Thyroid disease Mother    Suicidality Father    Breast cancer Maternal Grandmother        dx 57s-70s   Colon cancer Cousin        dx 48s   Skin cancer Cousin        SCC    Review of Systems  Constitutional:  Negative for appetite change, chills, fatigue, fever and unexpected weight change.  HENT:   Negative for hearing loss, lump/mass and trouble swallowing.   Eyes:  Negative for eye  problems and icterus.  Respiratory:  Negative for chest tightness, cough and shortness of breath.   Cardiovascular:  Negative for chest pain, leg swelling and palpitations.  Gastrointestinal:  Negative for abdominal distention, abdominal pain, constipation, diarrhea, nausea and vomiting.  Endocrine: Negative for hot flashes.  Genitourinary:  Negative for difficulty urinating.   Musculoskeletal:  Negative for arthralgias.  Skin:  Negative for itching and rash.  Neurological:  Negative for dizziness, extremity weakness, headaches and numbness.  Hematological:  Negative for adenopathy. Does not bruise/bleed easily.  Psychiatric/Behavioral:  Negative for depression. The patient is not nervous/anxious.       PHYSICAL EXAMINATION    There were no  vitals filed for this visit.  Physical Exam Constitutional:      General: She is not in acute distress.    Appearance: Normal appearance. She is not toxic-appearing.  HENT:     Head: Normocephalic and atraumatic.     Mouth/Throat:     Mouth: Mucous membranes are moist.     Pharynx: Oropharynx is clear. No oropharyngeal exudate or posterior oropharyngeal erythema.  Eyes:     General: No scleral icterus. Cardiovascular:     Rate and Rhythm: Normal rate and regular rhythm.     Pulses: Normal pulses.     Heart sounds: Normal heart sounds.  Pulmonary:     Effort: Pulmonary effort is normal.     Breath sounds: Normal breath sounds.  Chest:     Comments: Right breast with erythema, warmth and swelling noted, no fluctuance noted on exam  Abdominal:     General: Abdomen is flat. Bowel sounds are normal. There is no distension.     Palpations: Abdomen is soft.     Tenderness: There is no abdominal tenderness.  Musculoskeletal:        General: No swelling.     Cervical back: Neck supple.  Lymphadenopathy:     Cervical: No cervical adenopathy.     Upper Body:     Right upper body: No supraclavicular or axillary adenopathy.     Left upper  body: No supraclavicular or axillary adenopathy.  Skin:    General: Skin is warm and dry.     Findings: No rash.  Neurological:     General: No focal deficit present.     Mental Status: She is alert.  Psychiatric:        Mood and Affect: Mood normal.        Behavior: Behavior normal.     LABORATORY DATA:  CBC    Component Value Date/Time   WBC 8.2 09/01/2024 1812   RBC 4.84 09/01/2024 1812   HGB 13.2 09/01/2024 1812   HGB 11.7 08/11/2024 0833   HCT 41.1 09/01/2024 1812   HCT 37.2 08/11/2024 0833   PLT 397 09/01/2024 1812   PLT 350 08/11/2024 0833   MCV 84.9 09/01/2024 1812   MCV 86 08/11/2024 0833   MCH 27.3 09/01/2024 1812   MCHC 32.1 09/01/2024 1812   RDW 14.2 09/01/2024 1812   RDW 14.5 08/11/2024 0833   LYMPHSABS 2.3 09/01/2024 1812   LYMPHSABS 1.8 08/11/2024 0833   MONOABS 0.5 09/01/2024 1812   EOSABS 0.3 09/01/2024 1812   EOSABS 0.2 08/11/2024 0833   BASOSABS 0.0 09/01/2024 1812   BASOSABS 0.0 08/11/2024 0833    CMP     Component Value Date/Time   NA 144 09/01/2024 1812   NA 140 03/09/2024 1012   K 3.5 09/01/2024 1812   CL 103 09/01/2024 1812   CO2 25 09/01/2024 1812   GLUCOSE 107 (H) 09/01/2024 1812   BUN 14 09/01/2024 1812   BUN 13 03/09/2024 1012   CREATININE 0.67 09/01/2024 1812   CREATININE 0.70 07/30/2024 1253   CREATININE 0.66 05/09/2017 1118   CALCIUM  9.1 09/01/2024 1812   PROT 7.3 07/30/2024 1253   PROT 7.3 03/09/2024 1012   ALBUMIN 4.0 07/30/2024 1253   ALBUMIN 4.3 03/09/2024 1012   AST 13 (L) 07/30/2024 1253   ALT 9 07/30/2024 1253   ALKPHOS 51 07/30/2024 1253   BILITOT 0.3 07/30/2024 1253   GFRNONAA >60 09/01/2024 1812   GFRNONAA >60 07/30/2024 1253   GFRAA 106 10/16/2020 1201  ASSESSMENT and THERAPY PLAN:   Assessment and Plan Assessment & Plan Right breast cellulitis Right breast cellulitis with no improvement on doxycycline. Possible lymphedema. No systemic infection or leukocytosis. - Discontinue doxycycline. -  Prescribe Augmentin  for one week, advise taking with food and Imodium for diarrhea. - Prescribe Diflucan  to prevent yeast infection. - Prescribe Zofran  for nausea if needed. - Advise use of over-the-counter Voltaren gel for local inflammation and pain. - Order ultrasound of the right breast to assess for underlying abscess. - Instruct to monitor for systemic infection signs and seek emergency care if she occurs.  Invasive ductal carcinoma, stage 1A, ER, PR positive Post lumpectomy and adjuvant radiation. On tamoxifen  since June 2025. Concerns about scar healing. - Follow-up with surgeon in April 2026.   RTC if symptoms don't improve or worsen, red flags reviewed.     All questions were answered. The patient knows to call the clinic with any problems, questions or concerns. We can certainly see the patient much sooner if necessary.  Total encounter time:30 minutes*in face-to-face visit time, chart review, lab review, care coordination, order entry, and documentation of the encounter time.    Morna Kendall, NP 09/03/24 5:46 AM Medical Oncology and Hematology [facility] [address] [place], [place] [address] Tel. [phone]    Fax. [phone]  *Total Encounter Time as defined by the Centers for Medicare and Medicaid Services includes, in addition to the face-to-face time of a patient visit (documented in the note above) non-face-to-face time: obtaining and reviewing outside history, ordering and reviewing medications, tests or procedures, care coordination (communications with other health care professionals or caregivers) and documentation in the medical record.

## 2024-09-06 ENCOUNTER — Ambulatory Visit
Admission: RE | Admit: 2024-09-06 | Discharge: 2024-09-06 | Disposition: A | Discharge status: Home / Self Care | Source: Ambulatory Visit | Attending: Adult Health | Admitting: Adult Health

## 2024-09-06 ENCOUNTER — Inpatient Hospital Stay: Admission: RE | Admit: 2024-09-06 | Discharge: 2024-09-06 | Attending: Adult Health | Admitting: Adult Health

## 2024-09-06 DIAGNOSIS — C50411 Malignant neoplasm of upper-outer quadrant of right female breast: Secondary | ICD-10-CM

## 2024-09-06 DIAGNOSIS — N61 Mastitis without abscess: Secondary | ICD-10-CM

## 2024-09-09 ENCOUNTER — Other Ambulatory Visit: Payer: Self-pay

## 2024-09-09 DIAGNOSIS — N61 Mastitis without abscess: Secondary | ICD-10-CM

## 2024-09-09 MED ORDER — AMOXICILLIN-POT CLAVULANATE 875-125 MG PO TABS: 1.0000 | ORAL_TABLET | Freq: Two times a day (BID) | ORAL | 0 refills | Status: DC

## 2024-09-10 ENCOUNTER — Encounter: Payer: Self-pay | Admitting: Hematology and Oncology

## 2024-09-10 ENCOUNTER — Encounter (HOSPITAL_COMMUNITY)

## 2024-09-10 ENCOUNTER — Other Ambulatory Visit: Payer: Self-pay

## 2024-09-10 MED ORDER — AMOXICILLIN-POT CLAVULANATE 400-57 MG/5ML PO SUSR: 11.0000 mL | Freq: Two times a day (BID) | ORAL | 0 refills | Status: DC

## 2024-09-13 NOTE — Therapy (Signed)
 OUTPATIENT PHYSICAL THERAPY  UPPER EXTREMITY ONCOLOGY EVALUATION  Patient Name: Ariel Ayala MRN: [id number] DOB:03-Jul-1976, 48 y.o., female Today's Date: 09/14/2024  END OF SESSION:  PT End of Session - 09/14/24 0906     Visit Number 1    Number of Visits 6    Date for Recertification  10/26/24    Authorization Type [employer]    PT Start Time 9093   late   PT Stop Time 0951    PT Time Calculation (min) 45 min    Activity Tolerance Patient tolerated treatment well    Behavior During Therapy [facility] for tasks assessed/performed          Past Medical History:  Diagnosis Date   Asthma    as a child   Bloating 10/23/2020   CMV (cytomegalovirus infection) (HCC)    CMV (cytomegalovirus infection) (HCC)    Community acquired pneumonia 01/06/2024   Early satiety 10/23/2020   Family history of thyroid disease in mother 05/09/2017   Generalized abdominal pain 10/23/2020   Hair loss 03/12/2023   Impingement syndrome of left shoulder region 09/10/2023   Indigestion 10/23/2020   Invasive ductal carcinoma of breast, female, 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   Pain in joint of left shoulder 09/10/2023   Splenomegaly    on CT   Tenosynovitis of right hand 01/29/2023   Vitamin D  deficiency 10/25/2020   Past Surgical History:  Procedure Laterality Date   BREAST BIOPSY Right 11/28/2023   MM RT BREAST BX W LOC DEV 1ST LESION IMAGE BX SPEC STEREO GUIDE 11/28/2023 [facility]   BREAST BIOPSY  12/23/2023   MM RT RADIOACTIVE SEED LOC MAMMO GUIDE 12/23/2023 [facility]   BREAST LUMPECTOMY WITH RADIOACTIVE SEED AND SENTINEL LYMPH NODE BIOPSY Right 12/24/2023   Procedure: RIGHT BREAST LUMPECTOMY WITH RADIOACTIVE SEED AND SENTINEL LYMPH NODE BIOPSY;  Surgeon: Vernetta Berg, MD;  Location: [facility] OR;  Service: General;  Laterality: Right;   DILATION AND CURETTAGE OF UTERUS     FOOT SURGERY Right    with hardware   trigger thumb Right    Patient Active Problem List   Diagnosis Date Noted    Acute non-recurrent pansinusitis 05/12/2024   Non-recurrent acute suppurative otitis media of both ears without spontaneous rupture of tympanic membranes 05/12/2024   Screening for colon cancer 03/09/2024   Genetic testing 12/22/2023   Malignant neoplasm of upper-outer quadrant of right breast in female, estrogen receptor positive (HCC) 12/04/2023   Adenomyosis of uterus 08/01/2023   Iron  deficiency 07/02/2023   Mixed hyperlipidemia 07/02/2023   Perimenopause 03/12/2023   Osteoarthritis of carpometacarpal (CMC) joint of thumb 01/29/2023   Adhesive capsulitis of right shoulder 08/01/2022   Body mass index (BMI) of 26.0-26.9 in adult 04/02/2022   Anxiety 04/01/2022   Primary insomnia 04/01/2022   Abnormal cervical Papanicolaou smear 11/06/2021   Genital herpes simplex 03/13/2021   Vitamin D  deficiency 10/25/2020   Vegetarian diet 10/23/2020   Encounter for annual physical exam 10/23/2020   TMJ tenderness, bilateral 02/11/2020   Seasonal allergies 02/11/2020   Elevated TSH 05/09/2017   Splenomegaly 04/16/2017   Asthma 11/12/2016    PCP:   REFERRING PROVIDER:Paul Curvin, MD  REFERRING DIAG: Right breast swelling  THERAPY DIAG:  Malignant neoplasm of upper-outer quadrant of right breast in female, estrogen receptor positive (HCC)  Lymphedema, not elsewhere classified  Aftercare following surgery for neoplasm  ONSET DATE: 08/30/2024  Rationale for Evaluation and Treatment: Rehabilitation  SUBJECTIVE:  SUBJECTIVE STATEMENT:  Pt with right breast pain and swelling present since 10/12/025. Was placed on doxycycline and later seen in ED on 09/01/2024 because she felt it was worse.Her areola periodically becomes hard and white. No improvement with Doxycycline and was changed to Augmentin . She had  Diagnostic Mammogram no findings suspicious for malignancy and US  noted a hypoechoic area that was not felt to be fluid, but possibly a resolving abscess.  [facility] if anyone had concerns about inflammatory breast cancer because she had peau'dorange under the breast. The antibiotics have helped. I don't really have pain any more, and the swelling has reduced. My nipple is a little red and the skin around it is crusty. I started wearing the compression bra over the weekend and I think that has helped the swelling as well.  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 It measures 9 mm and is located in the upper outer quadrant. It is ER/PR positive and HER2 negative with a Ki67 of 5%. Rt lumpectomy and SLNB on 12/24/23 with 3 negative nodes removed. Had radiation 01/29/2024-02/25/2024. Right shoulder adhesive capsulitis diagnosed in mid 2023.    PAIN:  Are you having pain? No, mild tenderness. Much better since 2 weeks ago  PRECAUTIONS: right UE lymphedema risk  RED FLAGS: None   WEIGHT BEARING RESTRICTIONS: No  FALLS:  Has patient fallen in last 6 months? No  LIVING ENVIRONMENT: Lives with: lives with their daughter Lives in: House/apartment   OCCUPATION: market research  LEISURE: out with friends, watch soccer,  HAND DOMINANCE: right   PRIOR LEVEL OF FUNCTION: Independent  PATIENT GOALS: To get swelling down, learn MLD   OBJECTIVE: Note: Objective measures were completed at Evaluation unless otherwise noted.  COGNITION: Overall cognitive status: Within functional limits for tasks assessed   PALPATION: Tender at axilllary incision, and right axillary border of pectorals  OBSERVATIONS / OTHER ASSESSMENTS: mildly enlarged pores medial and inferior right breast with mild peau'dorange, firmness noted lateral/inferior breast. Mild redness still present since radiation. Areola with good  coloration presently and minimal swelling presently, but at times areola gets hard and blanches 2-3 times/week. Nipple appears slightly irritated  SENSATION: Light touch: Appears intact  POSTURE: forward head, rounded shoulders  UPPER EXTREMITY AROM/PROM:  A/PROM RIGHT   eval   Shoulder extension   Shoulder flexion 139  Shoulder abduction 120  Shoulder internal rotation   Shoulder external rotation     (Blank rows = not tested)  A/PROM LEFT   eval  Shoulder extension   Shoulder flexion 160  Shoulder abduction 137  Shoulder internal rotation   Shoulder external rotation     (Blank rows = not tested)  CERVICAL AROM: All within normal limits:      UPPER EXTREMITY STRENGTH:   LYMPHEDEMA ASSESSMENTS:   SURGERY TYPE/DATE: Rt lumpectomy and SLNB on 12/24/23  NUMBER OF LYMPH NODES REMOVED: 0+/3  CHEMOTHERAPY: NO  RADIATION:YES  HORMONE TREATMENT: YES  INFECTIONS: Yes, presently on antibiotics   LYMPHEDEMA ASSESSMENTS:   LANDMARK RIGHT  eval  At axilla    15 cm proximal to the proximal aspect of the olecranon process   10 cm proximal to the proximal aspect of the olecranon process   Olecranon process   15 cm proximal to the proximal aspect of the ulnar styloid process   10 cm proximal to the proximal aspect of the ulnar styloid process   Just distal  to the ulnar styloid process   Across hand at thumb web space   At base of 2nd digit   (Blank rows = not tested)  LANDMARK LEFT  eval  At axilla    15 cm proximal to the proximal aspect of the olecranon process   10 cm proximal to the proximal aspect of the olecranon process   Olecranon process   15 cm proximal to the proximal aspect of the ulnar styloid process   10 cm proximal to  the proximal aspect of the ulnar styloid process   Just distal to the ulnar styloid process   Across hand at thumb web space   At base of 2nd digit   (Blank rows = not tested)  Chest circumference just inferior to the  axillae:  Chest circumference at the largest point:       GAIT:  L-DEX LYMPHEDEMA SCREENING: The patient was assessed using the L-Dex machine today to produce a lymphedema index baseline score. The patient will be reassessed on a regular basis (typically every 3 months) to obtain new L-Dex scores. If the score is > 6.5 points away from his/her baseline score indicating onset of subclinical lymphedema, it will be recommended to wear a compression garment for 4 weeks, 12 hours per day and then be reassessed. If the score continues to be > 6.5 points from baseline at reassessment, we will initiate lymphedema treatment. Assessing in this manner has a 95% rate of preventing clinically significant lymphedema.     BREAST COMPLAINTS SURVEY: BREAST COMPLAINTS QUESTIONNAIRE Pain:2 Heaviness:7 Swollen feeling:9 Tense Skin:1 Redness:6 Bra Print:9 Size of Pores:10 Hard feeling: 8 Total:   52  /80 A Score over 9 indicates lymphedema issues in the breast                                                                                                                            TREATMENT DATE:  09/14/2024 Discussed POC, LOS, treatment interventions. At end of treatment pt questioned if she could have DN to her lateral arm. Burnard said to add to POC. Started instructing pt in MLD to the right breast. Explained purpose of LN's and pathways and showed wall poster for superficial nature of lymphatics.. In supine: Short neck, 5 diaphragmatic breaths, L axillary nodes and establishment of interaxillary pathway, R inguinal nodes and establishment of axilloinguinal pathway, then R breast moving fluid towards pathways spending extra time in any areas of fibrosis then retracing all steps. Explained techniques and gentleness while performing. Did not give handout.     PATIENT EDUCATION:  Education details: POC, LOS,treatment interventions,breast MLD Person educated: Patient Education method: Software Engineer Education comprehension: verbalized understanding  HOME EXERCISE PROGRAM:   ASSESSMENT:  CLINICAL IMPRESSION: Patient is a 48 y.o. female who was seen today for physical therapy evaluation and treatment for complaints of right breast pain and swelling. She developed induration and blanching at the areola several times/ week in addition to swelling  and mild peau d'orange at the medial and inferior breast. Symptoms of pain improved s/p antibiotics, and swelling improved some since wearing her compression bra . She will benefit from skilled PT to address deficits and return to PLOF.   OBJECTIVE IMPAIRMENTS: decreased knowledge of condition, decreased ROM, increased edema, impaired UE functional use, postural dysfunction, and pain.   ACTIVITY LIMITATIONS: reach over head  PARTICIPATION LIMITATIONS: able to do most activities without limitation  PERSONAL FACTORS: 1-2 comorbidities: Right breast cancer with SLNB, radiation are also affecting patient's functional outcome.   REHAB POTENTIAL: Good  CLINICAL DECISION MAKING: Stable/uncomplicated  EVALUATION COMPLEXITY: Low  GOALS: Goals reviewed with patient? Yes  SHORT TERM GOALS=LONG TERM GOALS: Target date: 10/26/2024  Pt will be compliant with compression bra wear to decrease breast swelling Baseline: Goal status: INITIAL  2.  Pt will be independent in self MLD to decrease Right breast swelling Baseline:  Goal status: INITIAL  3.  Pt will report breast swelling improved by 25-50% overall  Baseline:  Goal status: INITIAL  4.  Pts breast complaints questionaire will be reduced by 15-20 points to demonstrate overall improvement  Baseline:  Goal status: INITIAL     PLAN:  PT FREQUENCY: 1x/week  PT DURATION: 6 weeks  PLANNED INTERVENTIONS:  ADL/Self care home management, [phone]- PT Re-evaluation, 97110-Therapeutic exercises, 97530- Therapeutic activity, 97112- Neuromuscular re-education, 97535- Self Care,  97140- Manual therapy, 97760- Orthotic Initial, and 02236- Orthotic/Prosthetic subsequent, Dry Needling  PLAN FOR NEXT SESSION: continue instructing MLD and have pt start practicing. Limited visits left with insurance, pt wants DN for her right shoulder  Grayce JINNY Sheldon, PT 09/14/2024, 9:52 AM

## 2024-09-14 ENCOUNTER — Other Ambulatory Visit: Payer: Self-pay

## 2024-09-14 ENCOUNTER — Ambulatory Visit: Attending: General Surgery

## 2024-09-14 DIAGNOSIS — C50411 Malignant neoplasm of upper-outer quadrant of right female breast: Secondary | ICD-10-CM | POA: Diagnosis present

## 2024-09-14 DIAGNOSIS — I890 Lymphedema, not elsewhere classified: Secondary | ICD-10-CM | POA: Diagnosis present

## 2024-09-14 DIAGNOSIS — Z170 Estrogen receptor positive status [ER+]: Secondary | ICD-10-CM | POA: Diagnosis present

## 2024-09-14 DIAGNOSIS — N63 Unspecified lump in unspecified breast: Secondary | ICD-10-CM | POA: Insufficient documentation

## 2024-09-14 DIAGNOSIS — Z483 Aftercare following surgery for neoplasm: Secondary | ICD-10-CM | POA: Diagnosis present

## 2024-09-15 ENCOUNTER — Encounter: Payer: Self-pay | Admitting: Adult Health

## 2024-09-17 NOTE — Progress Notes (Signed)
 PROVIDER:  VICENTA DASIE POLI, MD  MRN: [id number] DOB: 04/16/1976 DATE OF ENCOUNTER: 09/17/2024 Subjective     Chief Complaint: Follow-up (breast)     History of Present Illness: Ariel Ayala is a 48 y.o. female who is seen today for a follow up of her breast infection.  She has improved with Augmentin  but still has some swelling and discomfort in the right breast     Review of Systems: A complete review of systems was obtained from the patient.  I have reviewed this information and discussed as appropriate with the patient.  See HPI as well for other ROS.  ROS    Medical History: Past Medical History:  Diagnosis Date  . Asthma, unspecified asthma severity, unspecified whether complicated, unspecified whether persistent ([facility]-HCC)   . History of cancer     There is no problem list on file for this patient.   Past Surgical History:  Procedure Laterality Date  . RT BR RSL([facility]) LUMP & SLNB  12/24/2023   Dr Poli  . DILATION AND CURETTAGE, DIAGNOSTIC / THERAPEUTIC    . foot surgery    . trigger thumb surgery       Allergies  Allergen Reactions  . Sulfa (Sulfonamide Antibiotics) Anaphylaxis, Itching, Other (See Comments) and Swelling    Throat swells and gets itchy  Throat swelling  Throat swells and gets itchy  Throat swells and gets itchy  . Nitrofurantoin Monohyd/M-Cryst Abdominal Pain, Nausea and Vomiting    Current Outpatient Medications on File Prior to Visit  Medication Sig Dispense Refill  . acetaminophen  (TYLENOL  EXTRA STRENGTH) 500 mg PwPk Take 1,000 mg by mouth    . cetirizine (ZYRTEC) 10 mg capsule Take 1 capsule by mouth once daily    . cholecalciferol (VITAMIN D3) 1000 unit capsule Take 1 capsule by mouth once daily    . cyanocobalamin, vitamin B-12, (VITAMIN B-12 ORAL)     . rosuvastatin  (CRESTOR ) 5 MG tablet Take 5 mg by mouth once daily    . sertraline (ZOLOFT) 25 MG tablet Take 1 tablet by mouth at bedtime    . tamoxifen  (NOLVADEX )  10 MG tablet     . gabapentin (NEURONTIN) 100 MG capsule Take 1 capsule (100 mg total) by mouth 3 (three) times daily as needed for up to 14 days 42 capsule 0   No current facility-administered medications on file prior to visit.    Family History  Problem Relation Age of Onset  . Colon cancer Other   . Breast cancer Other      Social History   Tobacco Use  Smoking Status Never  Smokeless Tobacco Never     Social History   Socioeconomic History  . Marital status: Single  Tobacco Use  . Smoking status: Never  . Smokeless tobacco: Never  Vaping Use  . Vaping status: Never Used  Substance and Sexual Activity  . Alcohol use: Yes  . Drug use: Never  . Sexual activity: Defer   Social Drivers of Health   Financial Resource Strain: Low Risk  (05/11/2024)   Received from [facility]   Overall Financial Resource Strain (CARDIA)   . How hard is it for you to pay for the very basics like food, housing, medical care, and heating?: Not hard at all  Food Insecurity: No Food Insecurity (05/11/2024)   Received from [facility]   Hunger Vital Sign   . Within the past 12 months, you worried that your food would run out before you got the  money to buy more.: Never true   . Within the past 12 months, the food you bought just didn't last and you didn't have money to get more.: Never true  Transportation Needs: No Transportation Needs (05/11/2024)   Received from [facility] - Transportation   . In the past 12 months, has lack of transportation kept you from medical appointments or from getting medications?: No   . In the past 12 months, has lack of transportation kept you from meetings, work, or from getting things needed for daily living?: No  Physical Activity: Inactive (05/11/2024)   Received from [facility]   Exercise Vital Sign   . On average, how many days per week do you engage in moderate to strenuous exercise (like a brisk walk)?: 0 days  Stress: No Stress Concern  Present (05/11/2024)   Received from [facility] - Occupational Stress Questionnaire   . Do you feel stress - tense, restless, nervous, or anxious, or unable to sleep at night because your mind is troubled all the time - these days?: Only a little  Social Connections: Unknown (05/11/2024)   Received from [facility]   Social Connection and Isolation Panel   . In a typical week, how many times do you talk on the phone with family, friends, or neighbors?: More than three times a week   . How often do you get together with friends or relatives?: Once a week   . How often do you attend church or religious services?: Patient declined   . Do you belong to any clubs or organizations such as church groups, unions, fraternal or athletic groups, or school groups?: Patient declined   . Are you married, widowed, divorced, separated, never married, or living with a partner?: Never married  Housing Stability: Low Risk  (01/15/2024)   Received from [facility] Stability Vital Sign   . Unable to Pay for Housing in the Last Year: No   . Number of Times Moved in the Last Year: 0   . Homeless in the Last Year: No    Objective:    Vitals:   09/17/24 0924  PainSc: 0-No pain    There is no height or weight on file to calculate BMI.  Physical Exam   She appears well A Chaperone was present  There is now no induration or erythema but there are chronic radiation changes at the areola and there is tenderness   Labs, Imaging and Diagnostic Testing:     Assessment and Plan:     Diagnoses and all orders for this visit:  Abscess of right breast  Other orders -     amoxicillin -clavulanate (AUGMENTIN ) 400-57 mg/5 mL suspension; Take 11 mLs by mouth every 12 (twelve) hours for 7 days -     fluconazole  (DIFLUCAN ) 150 MG tablet; Take 1 tablet (150 mg total) by mouth once for 1 dose     Given her symptoms and history of radiation to the breast, we  will continue antibiotics for 7 more days. She agrees with the plans  Return in about 2 weeks (around 10/01/2024).   VICENTA DASIE POLI, MD

## 2024-09-17 NOTE — Progress Notes (Signed)
 IDickey Friedenbach, CMA, was present as chaperone for the sensitive portion of the exam.

## 2024-09-20 ENCOUNTER — Ambulatory Visit (HOSPITAL_COMMUNITY)
Admission: RE | Admit: 2024-09-20 | Discharge: 2024-09-20 | Disposition: A | Discharge status: Home / Self Care | Source: Ambulatory Visit | Attending: Nurse Practitioner | Admitting: Nurse Practitioner

## 2024-09-20 VITALS — BP 103/76 | HR 71 | Temp 98.7°F | Resp 16

## 2024-09-20 DIAGNOSIS — E611 Iron deficiency: Secondary | ICD-10-CM | POA: Diagnosis present

## 2024-09-20 MED ORDER — IRON SUCROSE 300 MG IVPB - SIMPLE MED
300.0000 mg | Freq: Once | Status: AC
  Administered 2024-09-20: 300 mg via INTRAVENOUS
  Filled 2024-09-20: qty 300

## 2024-09-22 ENCOUNTER — Ambulatory Visit: Attending: General Surgery

## 2024-09-22 DIAGNOSIS — Z170 Estrogen receptor positive status [ER+]: Secondary | ICD-10-CM | POA: Diagnosis present

## 2024-09-22 DIAGNOSIS — N63 Unspecified lump in unspecified breast: Secondary | ICD-10-CM | POA: Insufficient documentation

## 2024-09-22 DIAGNOSIS — C50411 Malignant neoplasm of upper-outer quadrant of right female breast: Secondary | ICD-10-CM | POA: Diagnosis present

## 2024-09-22 DIAGNOSIS — I890 Lymphedema, not elsewhere classified: Secondary | ICD-10-CM | POA: Diagnosis present

## 2024-09-22 DIAGNOSIS — Z483 Aftercare following surgery for neoplasm: Secondary | ICD-10-CM | POA: Insufficient documentation

## 2024-09-22 NOTE — Therapy (Signed)
 OUTPATIENT PHYSICAL THERAPY  UPPER EXTREMITY ONCOLOGY TREATMENT  Patient Name: Ariel Ayala MRN: [id number] DOB:July 19, 1976, 48 y.o., female Today's Date: 09/22/2024  END OF SESSION:  PT End of Session - 09/22/24 0902     Visit Number 2    Number of Visits 6    Date for Recertification  10/26/24    Authorization Type [employer]    PT Start Time 0902    PT Stop Time 0950    PT Time Calculation (min) 48 min    Activity Tolerance Patient tolerated treatment well    Behavior During Therapy [facility] for tasks assessed/performed          Past Medical History:  Diagnosis Date   Asthma    as a child   Bloating 10/23/2020   CMV (cytomegalovirus infection) (HCC)    CMV (cytomegalovirus infection) (HCC)    Community acquired pneumonia 01/06/2024   Early satiety 10/23/2020   Family history of thyroid disease in mother 05/09/2017   Generalized abdominal pain 10/23/2020   Hair loss 03/12/2023   Impingement syndrome of left shoulder region 09/10/2023   Indigestion 10/23/2020   Invasive ductal carcinoma of breast, female, 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   Pain in joint of left shoulder 09/10/2023   Splenomegaly    on CT   Tenosynovitis of right hand 01/29/2023   Vitamin D  deficiency 10/25/2020   Past Surgical History:  Procedure Laterality Date   BREAST BIOPSY Right 11/28/2023   MM RT BREAST BX W LOC DEV 1ST LESION IMAGE BX SPEC STEREO GUIDE 11/28/2023 [facility]   BREAST BIOPSY  12/23/2023   MM RT RADIOACTIVE SEED LOC MAMMO GUIDE 12/23/2023 [facility]   BREAST LUMPECTOMY WITH RADIOACTIVE SEED AND SENTINEL LYMPH NODE BIOPSY Right 12/24/2023   Procedure: RIGHT BREAST LUMPECTOMY WITH RADIOACTIVE SEED AND SENTINEL LYMPH NODE BIOPSY;  Surgeon: Vernetta Berg, MD;  Location: [facility] OR;  Service: General;  Laterality: Right;   DILATION AND CURETTAGE OF UTERUS     FOOT SURGERY Right    with hardware   trigger thumb Right    Patient Active Problem List   Diagnosis Date Noted   Acute  non-recurrent pansinusitis 05/12/2024   Non-recurrent acute suppurative otitis media of both ears without spontaneous rupture of tympanic membranes 05/12/2024   Screening for colon cancer 03/09/2024   Genetic testing 12/22/2023   Malignant neoplasm of upper-outer quadrant of right breast in female, estrogen receptor positive (HCC) 12/04/2023   Adenomyosis of uterus 08/01/2023   Iron  deficiency 07/02/2023   Mixed hyperlipidemia 07/02/2023   Perimenopause 03/12/2023   Osteoarthritis of carpometacarpal (CMC) joint of thumb 01/29/2023   Adhesive capsulitis of right shoulder 08/01/2022   Body mass index (BMI) of 26.0-26.9 in adult 04/02/2022   Anxiety 04/01/2022   Primary insomnia 04/01/2022   Abnormal cervical Papanicolaou smear 11/06/2021   Genital herpes simplex 03/13/2021   Vitamin D  deficiency 10/25/2020   Vegetarian diet 10/23/2020   Encounter for annual physical exam 10/23/2020   TMJ tenderness, bilateral 02/11/2020   Seasonal allergies 02/11/2020   Elevated TSH 05/09/2017   Splenomegaly 04/16/2017   Asthma 11/12/2016    PCP:   REFERRING PROVIDER:Paul Curvin, MD  REFERRING DIAG: Right breast swelling  THERAPY DIAG:  Malignant neoplasm of upper-outer quadrant of right breast in female, estrogen receptor positive (HCC)  Lymphedema, not elsewhere classified  Breast swelling  Aftercare following surgery for neoplasm  ONSET DATE: 08/30/2024  Rationale for Evaluation and Treatment: Rehabilitation  SUBJECTIVE:  SUBJECTIVE STATEMENT:  I saw MD on Friday and he continued antibiotics for another week, and I will see hi on the 14 th. There is still some swelling. I have been wearing my compression bra most days, but at night its too hot. I havent noticed the areola getting white like it did. It  changes some when I am cold. It mostly stays pink. I feel a little firm and heavy at the lateral breast.   EVAL Pt with right breast pain and swelling present since 10/12/025. Was placed on doxycycline and later seen in ED on 09/01/2024 because she felt it was worse.Her areola periodically becomes hard and white. No improvement with Doxycycline and was changed to Augmentin . She had Diagnostic Mammogram no findings suspicious for malignancy and US  noted a hypoechoic area that was not felt to be fluid, but possibly a resolving abscess.  [facility] her if anyone had concerns about inflammatory breast cancer because she had peau'dorange under the breast. The antibiotics have helped. I don't really have pain any more, and the swelling has reduced. My nipple is a little red and the skin around it is crusty. I started wearing the compression bra over the weekend and I think that has helped the swelling as well.  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 It measures 9 mm and is located in the upper outer quadrant. It is ER/PR positive and HER2 negative with a Ki67 of 5%. Rt lumpectomy and SLNB on 12/24/23 with 3 negative nodes removed. Had radiation 01/29/2024-02/25/2024. Right shoulder adhesive capsulitis diagnosed in mid 2023.    PAIN:  Are you having pain? No, mild tenderness. Much better since 2 weeks ago  PRECAUTIONS: right UE lymphedema risk  RED FLAGS: None   WEIGHT BEARING RESTRICTIONS: No  FALLS:  Has patient fallen in last 6 months? No  LIVING ENVIRONMENT: Lives with: lives with their daughter Lives in: House/apartment   OCCUPATION: market research  LEISURE: out with friends, watch soccer,  HAND DOMINANCE: right   PRIOR LEVEL OF FUNCTION: Independent  PATIENT GOALS: To get swelling down, learn MLD   OBJECTIVE: Note: Objective measures were completed at Evaluation unless otherwise  noted.  COGNITION: Overall cognitive status: Within functional limits for tasks assessed   PALPATION: Tender at axilllary incision, and right axillary border of pectorals  OBSERVATIONS / OTHER ASSESSMENTS: mildly enlarged pores medial and inferior right breast with mild peau'dorange, firmness noted lateral/inferior breast. Mild redness still present since radiation. Areola with good coloration presently and minimal swelling presently, but at times areola gets hard and blanches 2-3 times/week. Nipple appears slightly irritated  SENSATION: Light touch: Appears intact  POSTURE: forward head, rounded shoulders  UPPER EXTREMITY AROM/PROM:  A/PROM RIGHT   eval   Shoulder extension   Shoulder flexion 139  Shoulder abduction 120  Shoulder internal rotation   Shoulder external rotation     (Blank rows = not tested)  A/PROM LEFT   eval  Shoulder extension   Shoulder flexion 160  Shoulder abduction 137  Shoulder internal rotation   Shoulder external rotation     (Blank rows = not tested)  CERVICAL AROM: All within normal limits:      UPPER EXTREMITY STRENGTH:   LYMPHEDEMA ASSESSMENTS:   SURGERY TYPE/DATE: Rt lumpectomy and SLNB on 12/24/23  NUMBER OF LYMPH NODES REMOVED: 0+/3  CHEMOTHERAPY: NO  RADIATION:YES  HORMONE TREATMENT: YES  INFECTIONS: Yes, presently on antibiotics  LYMPHEDEMA ASSESSMENTS:   LANDMARK RIGHT  eval  At axilla    15 cm proximal to the proximal aspect of the olecranon process   10 cm proximal to the proximal aspect of the olecranon process   Olecranon process   15 cm proximal to the proximal aspect of the ulnar styloid process   10 cm proximal to the proximal aspect of the ulnar styloid process   Just distal to the ulnar styloid process   Across hand at thumb web space   At base of 2nd digit   (Blank rows = not tested)  LANDMARK LEFT  eval  At axilla    15 cm proximal to the proximal aspect of the olecranon process   10 cm  proximal to the proximal aspect of the olecranon process   Olecranon process   15 cm proximal to the proximal aspect of the ulnar styloid process   10 cm proximal to  the proximal aspect of the ulnar styloid process   Just distal to the ulnar styloid process   Across hand at thumb web space   At base of 2nd digit   (Blank rows = not tested)  Chest circumference just inferior to the axillae:  Chest circumference at the largest point:       GAIT:  L-DEX LYMPHEDEMA SCREENING: The patient was assessed using the L-Dex machine today to produce a lymphedema index baseline score. The patient will be reassessed on a regular basis (typically every 3 months) to obtain new L-Dex scores. If the score is > 6.5 points away from his/her baseline score indicating onset of subclinical lymphedema, it will be recommended to wear a compression garment for 4 weeks, 12 hours per day and then be reassessed. If the score continues to be > 6.5 points from baseline at reassessment, we will initiate lymphedema treatment. Assessing in this manner has a 95% rate of preventing clinically significant lymphedema.     BREAST COMPLAINTS SURVEY: BREAST COMPLAINTS QUESTIONNAIRE Pain:2 Heaviness:7 Swollen feeling:9 Tense Skin:1 Redness:6 Bra Print:9 Size of Pores:10 Hard feeling: 8 Total:   52  /80 A Score over 9 indicates lymphedema issues in the breast                                                                                                                            TREATMENT DATE:  09/22/2024 Reminded pt about the superficial nature of lymphatics and the benefit of stretch, not slide. Therapist read handout to pt and demonstrated steps to pt, and had pt return demonstrate all as below. Therapist then performed MLD to medial and lateral breast and pathways as below, ending with LN's In supine: Short neck, 5 diaphragmatic breaths, Land right  axillary nodes and establishment of interaxillary pathway, R  inguinal nodes and establishment of axilloinguinal pathway, then R breast moving fluid towards pathways spending extra time in any areas of fibrosis then retracing all steps and ending with axillary and right inguinal LN's. Written  and illustrated handout given to pt.   09/14/2024 Discussed POC, LOS, treatment interventions. At end of treatment pt questioned if she could have DN to her lateral arm. Burnard said to add to POC. Started instructing pt in MLD to the right breast. Explained purpose of LN's and pathways and showed wall poster for superficial nature of lymphatics.. In supine: Short neck, 5 diaphragmatic breaths, L axillary nodes and establishment of interaxillary pathway, R inguinal nodes and establishment of axilloinguinal pathway, then R breast moving fluid towards pathways spending extra time in any areas of fibrosis then retracing all steps. Explained techniques and gentleness while performing. Did not give handout.     PATIENT EDUCATION:  Education details: POC, LOS,treatment interventions,breast MLD Person educated: Patient Education method: Medical Illustrator Education comprehension: verbalized understanding  HOME EXERCISE PROGRAM:   ASSESSMENT:  CLINICAL IMPRESSION: Pts breast overall improved and has not recently had induration and blanching at areola. Pt did very well with initial instruction of MLD using good pressure and spacing with occasional VC's for technique.     OBJECTIVE IMPAIRMENTS: decreased knowledge of condition, decreased ROM, increased edema, impaired UE functional use, postural dysfunction, and pain.   ACTIVITY LIMITATIONS: reach over head  PARTICIPATION LIMITATIONS: able to do most activities without limitation  PERSONAL FACTORS: 1-2 comorbidities: Right breast cancer with SLNB, radiation are also affecting patient's functional outcome.   REHAB POTENTIAL: Good  CLINICAL DECISION MAKING: Stable/uncomplicated  EVALUATION COMPLEXITY:  Low  GOALS: Goals reviewed with patient? Yes  SHORT TERM GOALS=LONG TERM GOALS: Target date: 10/26/2024  Pt will be compliant with compression bra wear to decrease breast swelling Baseline: Goal status: INITIAL  2.  Pt will be independent in self MLD to decrease Right breast swelling Baseline:  Goal status: INITIAL  3.  Pt will report breast swelling improved by 25-50% overall  Baseline:  Goal status: INITIAL  4.  Pts breast complaints questionaire will be reduced by 15-20 points to demonstrate overall improvement  Baseline:  Goal status: INITIAL     PLAN:  PT FREQUENCY: 1x/week  PT DURATION: 6 weeks  PLANNED INTERVENTIONS:  ADL/Self care home management, [phone]- PT Re-evaluation, 97110-Therapeutic exercises, 97530- Therapeutic activity, 97112- Neuromuscular re-education, 97535- Self Care, [id number]- Manual therapy, 97760- Orthotic Initial, and [id number]- Orthotic/Prosthetic subsequent, Dry Needling  PLAN FOR NEXT SESSION: continue instructing MLD and have pt continue practicing. Chip pack? Limited visits left with insurance, pt wants DN for her right shoulder  Ariel Ayala, PT 09/22/2024, 9:58 AM

## 2024-09-23 ENCOUNTER — Telehealth: Payer: Self-pay

## 2024-09-23 ENCOUNTER — Other Ambulatory Visit: Payer: Self-pay

## 2024-09-23 DIAGNOSIS — E611 Iron deficiency: Secondary | ICD-10-CM

## 2024-09-23 NOTE — Telephone Encounter (Signed)
 Lab orders are in the system can call pt. Back and schedule lab visit now.    Copied from CRM #[id number]. Topic: Clinical - Request for Lab/Test Order >> Sep 23, 2024 11:32 AM Ariel Ayala wrote: Reason for CRM: Patient is requesting labs to check her HGB and Iron  level. Please add order for patient to schedule an appointment.  Patient states okay to send MyChart message once orders placed.

## 2024-09-27 ENCOUNTER — Ambulatory Visit

## 2024-09-28 ENCOUNTER — Ambulatory Visit

## 2024-09-28 DIAGNOSIS — C50411 Malignant neoplasm of upper-outer quadrant of right female breast: Secondary | ICD-10-CM | POA: Diagnosis not present

## 2024-09-28 DIAGNOSIS — Z483 Aftercare following surgery for neoplasm: Secondary | ICD-10-CM

## 2024-09-28 DIAGNOSIS — N63 Unspecified lump in unspecified breast: Secondary | ICD-10-CM

## 2024-09-28 DIAGNOSIS — I890 Lymphedema, not elsewhere classified: Secondary | ICD-10-CM

## 2024-09-28 NOTE — Therapy (Signed)
 OUTPATIENT PHYSICAL THERAPY  UPPER EXTREMITY ONCOLOGY TREATMENT  Patient Name: JAELYNNE HOCKLEY MRN: [id number] DOB:Sep 01, 1976, 48 y.o., female Today's Date: 09/28/2024  END OF SESSION:  PT End of Session - 09/28/24 1105     Visit Number 3    Number of Visits 6    Date for Recertification  10/26/24    Authorization Type [employer]    PT Start Time 1105    PT Stop Time 1200    PT Time Calculation (min) 55 min    Activity Tolerance Patient tolerated treatment well    Behavior During Therapy [facility] for tasks assessed/performed          Past Medical History:  Diagnosis Date   Asthma    as a child   Bloating 10/23/2020   CMV (cytomegalovirus infection) (HCC)    CMV (cytomegalovirus infection) (HCC)    Community acquired pneumonia 01/06/2024   Early satiety 10/23/2020   Family history of thyroid disease in mother 05/09/2017   Generalized abdominal pain 10/23/2020   Hair loss 03/12/2023   Impingement syndrome of left shoulder region 09/10/2023   Indigestion 10/23/2020   Invasive ductal carcinoma of breast, female, 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   Pain in joint of left shoulder 09/10/2023   Splenomegaly    on CT   Tenosynovitis of right hand 01/29/2023   Vitamin D  deficiency 10/25/2020   Past Surgical History:  Procedure Laterality Date   BREAST BIOPSY Right 11/28/2023   MM RT BREAST BX W LOC DEV 1ST LESION IMAGE BX SPEC STEREO GUIDE 11/28/2023 [facility]   BREAST BIOPSY  12/23/2023   MM RT RADIOACTIVE SEED LOC MAMMO GUIDE 12/23/2023 [facility]   BREAST LUMPECTOMY WITH RADIOACTIVE SEED AND SENTINEL LYMPH NODE BIOPSY Right 12/24/2023   Procedure: RIGHT BREAST LUMPECTOMY WITH RADIOACTIVE SEED AND SENTINEL LYMPH NODE BIOPSY;  Surgeon: Vernetta Berg, MD;  Location: [facility] OR;  Service: General;  Laterality: Right;   DILATION AND CURETTAGE OF UTERUS     FOOT SURGERY Right    with hardware   trigger thumb Right    Patient Active Problem List   Diagnosis Date Noted   Acute  non-recurrent pansinusitis 05/12/2024   Non-recurrent acute suppurative otitis media of both ears without spontaneous rupture of tympanic membranes 05/12/2024   Screening for colon cancer 03/09/2024   Genetic testing 12/22/2023   Malignant neoplasm of upper-outer quadrant of right breast in female, estrogen receptor positive (HCC) 12/04/2023   Adenomyosis of uterus 08/01/2023   Iron  deficiency 07/02/2023   Mixed hyperlipidemia 07/02/2023   Perimenopause 03/12/2023   Osteoarthritis of carpometacarpal (CMC) joint of thumb 01/29/2023   Adhesive capsulitis of right shoulder 08/01/2022   Body mass index (BMI) of 26.0-26.9 in adult 04/02/2022   Anxiety 04/01/2022   Primary insomnia 04/01/2022   Abnormal cervical Papanicolaou smear 11/06/2021   Genital herpes simplex 03/13/2021   Vitamin D  deficiency 10/25/2020   Vegetarian diet 10/23/2020   Encounter for annual physical exam 10/23/2020   TMJ tenderness, bilateral 02/11/2020   Seasonal allergies 02/11/2020   Elevated TSH 05/09/2017   Splenomegaly 04/16/2017   Asthma 11/12/2016    PCP:   REFERRING PROVIDER:Paul Curvin, MD  REFERRING DIAG: Right breast swelling  THERAPY DIAG:  Lymphedema, not elsewhere classified  Breast swelling  Malignant neoplasm of upper-outer quadrant of right breast in female, estrogen receptor positive [facility])  Aftercare following surgery for neoplasm  ONSET DATE: 08/30/2024  Rationale for Evaluation and Treatment: Rehabilitation  SUBJECTIVE:  SUBJECTIVE STATEMENT:  I have been doing my MLD every day. Sometimes its hard to reach the other side.I have had 2 instances of the white hardness at the areola/nipple that I have noticed, but it doesn't last.  I have finished the antibiotic and I see the surgeon on Friday.  EVAL Pt  with right breast pain and swelling present since 10/12/025. Was placed on doxycycline and later seen in ED on 09/01/2024 because she felt it was worse.Her areola periodically becomes hard and white. No improvement with Doxycycline and was changed to Augmentin . She had Diagnostic Mammogram no findings suspicious for malignancy and US  noted a hypoechoic area that was not felt to be fluid, but possibly a resolving abscess.  [facility] if anyone had concerns about inflammatory breast cancer because she had peau'dorange under the breast. The antibiotics have helped. I don't really have pain any more, and the swelling has reduced. My nipple is a little red and the skin around it is crusty. I started wearing the compression bra over the weekend and I think that has helped the swelling as well.  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 It measures 9 mm and is located in the upper outer quadrant. It is ER/PR positive and HER2 negative with a Ki67 of 5%. Rt lumpectomy and SLNB on 12/24/23 with 3 negative nodes removed. Had radiation 01/29/2024-02/25/2024. Right shoulder adhesive capsulitis diagnosed in mid 2023.    PAIN:  Are you having pain? No, mild tenderness. Much better since 2 weeks ago  PRECAUTIONS: right UE lymphedema risk  RED FLAGS: None   WEIGHT BEARING RESTRICTIONS: No  FALLS:  Has patient fallen in last 6 months? No  LIVING ENVIRONMENT: Lives with: lives with their daughter Lives in: House/apartment   OCCUPATION: market research  LEISURE: out with friends, watch soccer,  HAND DOMINANCE: right   PRIOR LEVEL OF FUNCTION: Independent  PATIENT GOALS: To get swelling down, learn MLD   OBJECTIVE: Note: Objective measures were completed at Evaluation unless otherwise noted.  COGNITION: Overall cognitive status: Within functional limits for tasks assessed   PALPATION: Tender at axilllary  incision, and right axillary border of pectorals  OBSERVATIONS / OTHER ASSESSMENTS: mildly enlarged pores medial and inferior right breast with mild peau'dorange, firmness noted lateral/inferior breast. Mild redness still present since radiation. Areola with good coloration presently and minimal swelling presently, but at times areola gets hard and blanches 2-3 times/week. Nipple appears slightly irritated  SENSATION: Light touch: Appears intact  POSTURE: forward head, rounded shoulders  UPPER EXTREMITY AROM/PROM:  A/PROM RIGHT   eval   Shoulder extension   Shoulder flexion 139  Shoulder abduction 120  Shoulder internal rotation   Shoulder external rotation     (Blank rows = not tested)  A/PROM LEFT   eval  Shoulder extension   Shoulder flexion 160  Shoulder abduction 137  Shoulder internal rotation   Shoulder external rotation     (Blank rows = not tested)  CERVICAL AROM: All within normal limits:      UPPER EXTREMITY STRENGTH:   LYMPHEDEMA ASSESSMENTS:   SURGERY TYPE/DATE: Rt lumpectomy and SLNB on 12/24/23  NUMBER OF LYMPH NODES REMOVED: 0+/3  CHEMOTHERAPY: NO  RADIATION:YES  HORMONE TREATMENT: YES  INFECTIONS: Yes, presently on antibiotics   LYMPHEDEMA ASSESSMENTS:   LANDMARK RIGHT  eval  At axilla    15 cm proximal to the proximal aspect of the olecranon  process   10 cm proximal to the proximal aspect of the olecranon process   Olecranon process   15 cm proximal to the proximal aspect of the ulnar styloid process   10 cm proximal to the proximal aspect of the ulnar styloid process   Just distal to the ulnar styloid process   Across hand at thumb web space   At base of 2nd digit   (Blank rows = not tested)  LANDMARK LEFT  eval  At axilla    15 cm proximal to the proximal aspect of the olecranon process   10 cm proximal to the proximal aspect of the olecranon process   Olecranon process   15 cm proximal to the proximal aspect of the ulnar  styloid process   10 cm proximal to  the proximal aspect of the ulnar styloid process   Just distal to the ulnar styloid process   Across hand at thumb web space   At base of 2nd digit   (Blank rows = not tested)  Chest circumference just inferior to the axillae: 92.5 Chest circumference at the largest point:  99 cm     GAIT:  L-DEX LYMPHEDEMA SCREENING:   L-DEX FLOWSHEETS - 09/28/24 1200       L-DEX LYMPHEDEMA SCREENING   Measurement Type Unilateral    L-DEX MEASUREMENT EXTREMITY Upper Extremity    POSITION  Standing    DOMINANT SIDE Right    At Risk Side Right    BASELINE SCORE (UNILATERAL) -3    L-DEX SCORE (UNILATERAL) -3.5    VALUE CHANGE (UNILAT) -0.5             BREAST COMPLAINTS SURVEY: BREAST COMPLAINTS QUESTIONNAIRE Pain:2 Heaviness:7 Swollen feeling:9 Tense Skin:1 Redness:6 Bra Print:9 Size of Pores:10 Hard feeling: 8 Total:   52  /80 A Score over 9 indicates lymphedema issues in the breast                                                                                                                            TREATMENT DATE:   09/28/2024 Measured chest circumferences over compression bra Therapist performed first and then pt return demonstrated all steps for Right breast MLD. Made minimal modification to pts performance, however, she did better using her left hand for anterior interaxillary pathway. In supine: Short neck, 5 diaphragmatic breaths, L and right  axillary nodes and establishment of interaxillary pathway, R inguinal nodes and establishment of axilloinguinal pathway, then R breast moving fluid towards pathways spending extra time in any areas of fibrosis then retracing all steps and ending with axillary and right inguinal LN's. Did SOZO screen. Well in the green and had pt set up next one Expressed concern to pt that per what I can see she has had 20 visits this year for her therapy. Insurance had told her otherwise. Advised her to  reach out again just to be sure. Discussed Flexi touch and gave Flyer. She would like  for me to send her demographics to have them checked since she has met OOP. 09/22/2024 Reminded pt about the superficial nature of lymphatics and the benefit of stretch, not slide. Therapist read handout to pt and demonstrated steps to pt, and had pt return demonstrate all as below. Therapist then performed MLD to medial and lateral breast and pathways as below, ending with LN's In supine: Short neck, 5 diaphragmatic breaths, Land right  axillary nodes and establishment of interaxillary pathway, R inguinal nodes and establishment of axilloinguinal pathway, then R breast moving fluid towards pathways spending extra time in any areas of fibrosis then retracing all steps and ending with axillary and right inguinal LN's. Written and illustrated handout given to pt.   09/14/2024 Discussed POC, LOS, treatment interventions. At end of treatment pt questioned if she could have DN to her lateral arm. Burnard said to add to POC. Started instructing pt in MLD to the right breast. Explained purpose of LN's and pathways and showed wall poster for superficial nature of lymphatics.. In supine: Short neck, 5 diaphragmatic breaths, L axillary nodes and establishment of interaxillary pathway, R inguinal nodes and establishment of axilloinguinal pathway, then R breast moving fluid towards pathways spending extra time in any areas of fibrosis then retracing all steps. Explained techniques and gentleness while performing. Did not give handout.     PATIENT EDUCATION:  Education details: POC, LOS,treatment interventions,breast MLD Person educated: Patient Education method: Medical Illustrator Education comprehension: verbalized understanding  HOME EXERCISE PROGRAM:   ASSESSMENT:  CLINICAL IMPRESSION:  Pt did very well with right breast MLD with 1 modification to use her left hand for anterior interaxillary pathway.  Otherwise, technique was quite good overall. She is checking to see if she truly has more insurance visits left and may come for 1-2 visits to reinforce her learning. Demographics sent to [facility] today. She has had 2 episodes of induration and blanching at the areola/nipple since last visit, but not lasting long.   OBJECTIVE IMPAIRMENTS: decreased knowledge of condition, decreased ROM, increased edema, impaired UE functional use, postural dysfunction, and pain.   ACTIVITY LIMITATIONS: reach over head  PARTICIPATION LIMITATIONS: able to do most activities without limitation  PERSONAL FACTORS: 1-2 comorbidities: Right breast cancer with SLNB, radiation are also affecting patient's functional outcome.   REHAB POTENTIAL: Good  CLINICAL DECISION MAKING: Stable/uncomplicated  EVALUATION COMPLEXITY: Low  GOALS: Goals reviewed with patient? Yes  SHORT TERM GOALS=LONG TERM GOALS: Target date: 10/26/2024  Pt will be compliant with compression bra wear to decrease breast swelling Baseline: Goal status: MET  2.  Pt will be independent in self MLD to decrease Right breast swelling Baseline:  Goal status: INITIAL  3.  Pt will report breast swelling improved by 25-50% overall  Baseline:  Goal status: INITIAL  4.  Pts breast complaints questionaire will be reduced by 15-20 points to demonstrate overall improvement  Baseline:  Goal status: INITIAL     PLAN:  PT FREQUENCY: 1x/week  PT DURATION: 6 weeks  PLANNED INTERVENTIONS:  ADL/Self care home management, [phone]- PT Re-evaluation, 97110-Therapeutic exercises, 97530- Therapeutic activity, 97112- Neuromuscular re-education, 97535- Self Care, [id number]- Manual therapy, 97760- Orthotic Initial, and [id number]- Orthotic/Prosthetic subsequent, Dry Needling  PLAN FOR NEXT SESSION: continue instructing MLD and have pt continue practicing. Chip pack? Limited visits left with insurance, pt wants DN for her right shoulder  Grayce JINNY Sheldon,  PT 09/28/2024, 12:28 PM

## 2024-09-30 ENCOUNTER — Inpatient Hospital Stay: Attending: Hematology and Oncology | Admitting: Licensed Clinical Social Worker

## 2024-09-30 NOTE — Progress Notes (Signed)
 [facility] CSW Counseling Note  Patient was referred by self. Treatment type: Individual  Presenting Concerns: Patient and/or family reports the following symptoms/concerns: pain; adjustment to cancer Duration of problem: 1 month; Severity of problem: moderate   Orientation:oriented to person, place, time/date, and situation.   Affect: Appropriate and Congruent Risk of harm to self or others: No plan to harm self or others  Patient and/or Family's Strengths/Protective Factors: Social connections, Social and Emotional competence, and Concrete supports in place (healthy food, safe environments, etc.)Ability for insight  Capable of independent living  Communication skills  Motivation for treatment/growth  Supportive family/friends      Goals Addressed: Patient will:  Increase ability to cope with pain and uncertainty with cancer treatments and recovery   Progress towards Goals: Progressing   Interventions: Interventions utilized:  CBT      Assessment: Patient is experiencing increased stress since last visit. She has had more medical appts due to breast infection as well as more shoulder issues.  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  Pt also shared how she is processing a conflict with her mom. Pt is coping with these increased stressors well overall and has been able to acknowledge good that has occurred (daughter has her license now).  Provided supportive counsel around this season of stress.      Plan: Follow up with CSW: 1 month Behavioral recommendations: Continue to notice the good to help through this time of increased stressors.  Remember that it is up to you what actions you take or boundaries you set with others.  Referral(s): Support group.  FYNN class September-October       Rohil Lesch E Simran Bomkamp, LCSW

## 2024-10-06 ENCOUNTER — Ambulatory Visit

## 2024-10-11 ENCOUNTER — Ambulatory Visit

## 2024-10-11 DIAGNOSIS — C50411 Malignant neoplasm of upper-outer quadrant of right female breast: Secondary | ICD-10-CM

## 2024-10-11 DIAGNOSIS — N63 Unspecified lump in unspecified breast: Secondary | ICD-10-CM

## 2024-10-11 DIAGNOSIS — I890 Lymphedema, not elsewhere classified: Secondary | ICD-10-CM

## 2024-10-11 DIAGNOSIS — Z483 Aftercare following surgery for neoplasm: Secondary | ICD-10-CM

## 2024-10-11 NOTE — Therapy (Signed)
 OUTPATIENT PHYSICAL THERAPY  UPPER EXTREMITY ONCOLOGY TREATMENT  Patient Name: Ariel Ayala MRN: [id number] DOB:Jan 15, 1976, 48 y.o., female Today's Date: 10/11/2024  END OF SESSION:  PT End of Session - 10/11/24 1402     Visit Number 4    Number of Visits 6    Date for Recertification  10/26/24    Authorization Type [employer]    PT Start Time 1402    PT Stop Time 1452    PT Time Calculation (min) 50 min    Activity Tolerance Patient tolerated treatment well    Behavior During Therapy [facility] for tasks assessed/performed          Past Medical History:  Diagnosis Date   Asthma    as a child   Bloating 10/23/2020   CMV (cytomegalovirus infection) (HCC)    CMV (cytomegalovirus infection) (HCC)    Community acquired pneumonia 01/06/2024   Early satiety 10/23/2020   Family history of thyroid disease in mother 05/09/2017   Generalized abdominal pain 10/23/2020   Hair loss 03/12/2023   Impingement syndrome of left shoulder region 09/10/2023   Indigestion 10/23/2020   Invasive ductal carcinoma of breast, female, 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   Pain in joint of left shoulder 09/10/2023   Splenomegaly    on CT   Tenosynovitis of right hand 01/29/2023   Vitamin D  deficiency 10/25/2020   Past Surgical History:  Procedure Laterality Date   BREAST BIOPSY Right 11/28/2023   MM RT BREAST BX W LOC DEV 1ST LESION IMAGE BX SPEC STEREO GUIDE 11/28/2023 [facility]   BREAST BIOPSY  12/23/2023   MM RT RADIOACTIVE SEED LOC MAMMO GUIDE 12/23/2023 [facility]   BREAST LUMPECTOMY WITH RADIOACTIVE SEED AND SENTINEL LYMPH NODE BIOPSY Right 12/24/2023   Procedure: RIGHT BREAST LUMPECTOMY WITH RADIOACTIVE SEED AND SENTINEL LYMPH NODE BIOPSY;  Surgeon: Vernetta Berg, MD;  Location: [facility] OR;  Service: General;  Laterality: Right;   DILATION AND CURETTAGE OF UTERUS     FOOT SURGERY Right    with hardware   trigger thumb Right    Patient Active Problem List   Diagnosis Date Noted   Acute  non-recurrent pansinusitis 05/12/2024   Non-recurrent acute suppurative otitis media of both ears without spontaneous rupture of tympanic membranes 05/12/2024   Screening for colon cancer 03/09/2024   Genetic testing 12/22/2023   Malignant neoplasm of upper-outer quadrant of right breast in female, estrogen receptor positive (HCC) 12/04/2023   Adenomyosis of uterus 08/01/2023   Iron  deficiency 07/02/2023   Mixed hyperlipidemia 07/02/2023   Perimenopause 03/12/2023   Osteoarthritis of carpometacarpal (CMC) joint of thumb 01/29/2023   Adhesive capsulitis of right shoulder 08/01/2022   Body mass index (BMI) of 26.0-26.9 in adult 04/02/2022   Anxiety 04/01/2022   Primary insomnia 04/01/2022   Abnormal cervical Papanicolaou smear 11/06/2021   Genital herpes simplex 03/13/2021   Vitamin D  deficiency 10/25/2020   Vegetarian diet 10/23/2020   Encounter for annual physical exam 10/23/2020   TMJ tenderness, bilateral 02/11/2020   Seasonal allergies 02/11/2020   Elevated TSH 05/09/2017   Splenomegaly 04/16/2017   Asthma 11/12/2016    PCP:   REFERRING PROVIDER:Paul Curvin, MD  REFERRING DIAG: Right breast swelling  THERAPY DIAG:  Lymphedema, not elsewhere classified  Breast swelling  Malignant neoplasm of upper-outer quadrant of right breast in female, estrogen receptor positive [facility])  Aftercare following surgery for neoplasm  ONSET DATE: 08/30/2024  Rationale for Evaluation and Treatment: Rehabilitation  SUBJECTIVE:  SUBJECTIVE STATEMENT:  I feel like my breast is doing better with less swelling and no pain. I haven't noticed the blanching in the last week. I do the MLD 1-2 x/day. I feel like the breast is 90% better. I had injections in both shoulders last week. They seem to be doing some  better.  EVAL Pt with right breast pain and swelling present since 10/12/025. Was placed on doxycycline  and later seen in ED on 09/01/2024 because she felt it was worse.Her areola periodically becomes hard and white. No improvement with Doxycycline  and was changed to Augmentin . She had Diagnostic Mammogram no findings suspicious for malignancy and US  noted a hypoechoic area that was not felt to be fluid, but possibly a resolving abscess.  [facility] her if anyone had concerns about inflammatory breast cancer because she had peau'dorange under the breast. The antibiotics have helped. I don't really have pain any more, and the swelling has reduced. My nipple is a little red and the skin around it is crusty. I started wearing the compression bra over the weekend and I think that has helped the swelling as well.  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 It measures 9 mm and is located in the upper outer quadrant. It is ER/PR positive and HER2 negative with a Ki67 of 5%. Rt lumpectomy and SLNB on 12/24/23 with 3 negative nodes removed. Had radiation 01/29/2024-02/25/2024. Right shoulder adhesive capsulitis diagnosed in mid 2023.    PAIN:  Are you having pain? No, mild tenderness. Much better since 2 weeks ago  PRECAUTIONS: right UE lymphedema risk  RED FLAGS: None   WEIGHT BEARING RESTRICTIONS: No  FALLS:  Has patient fallen in last 6 months? No  LIVING ENVIRONMENT: Lives with: lives with their daughter Lives in: House/apartment   OCCUPATION: market research  LEISURE: out with friends, watch soccer,  HAND DOMINANCE: right   PRIOR LEVEL OF FUNCTION: Independent  PATIENT GOALS: To get swelling down, learn MLD   OBJECTIVE: Note: Objective measures were completed at Evaluation unless otherwise noted.  COGNITION: Overall cognitive status: Within functional limits for tasks  assessed   PALPATION: Tender at axilllary incision, and right axillary border of pectorals  OBSERVATIONS / OTHER ASSESSMENTS: mildly enlarged pores medial and inferior right breast with mild peau'dorange, firmness noted lateral/inferior breast. Mild redness still present since radiation. Areola with good coloration presently and minimal swelling presently, but at times areola gets hard and blanches 2-3 times/week. Nipple appears slightly irritated  SENSATION: Light touch: Appears intact  POSTURE: forward head, rounded shoulders  UPPER EXTREMITY AROM/PROM:  A/PROM RIGHT   eval   Shoulder extension   Shoulder flexion 139  Shoulder abduction 120  Shoulder internal rotation   Shoulder external rotation     (Blank rows = not tested)  A/PROM LEFT   eval  Shoulder extension   Shoulder flexion 160  Shoulder abduction 137  Shoulder internal rotation   Shoulder external rotation     (Blank rows = not tested)  CERVICAL AROM: All within normal limits:      UPPER EXTREMITY STRENGTH:   LYMPHEDEMA ASSESSMENTS:   SURGERY TYPE/DATE: Rt lumpectomy and SLNB on 12/24/23  NUMBER OF LYMPH NODES REMOVED: 0+/3  CHEMOTHERAPY: NO  RADIATION:YES  HORMONE TREATMENT: YES  INFECTIONS: Yes, presently on antibiotics   LYMPHEDEMA ASSESSMENTS:   LANDMARK RIGHT  eval  At axilla    15 cm proximal to the proximal  aspect of the olecranon process   10 cm proximal to the proximal aspect of the olecranon process   Olecranon process   15 cm proximal to the proximal aspect of the ulnar styloid process   10 cm proximal to the proximal aspect of the ulnar styloid process   Just distal to the ulnar styloid process   Across hand at thumb web space   At base of 2nd digit   (Blank rows = not tested)  LANDMARK LEFT  eval  At axilla    15 cm proximal to the proximal aspect of the olecranon process   10 cm proximal to the proximal aspect of the olecranon process   Olecranon process   15 cm  proximal to the proximal aspect of the ulnar styloid process   10 cm proximal to  the proximal aspect of the ulnar styloid process   Just distal to the ulnar styloid process   Across hand at thumb web space   At base of 2nd digit   (Blank rows = not tested)  Chest circumference just inferior to the axillae: 92.5 Chest circumference at the largest point:  99 cm     GAIT:  L-DEX LYMPHEDEMA SCREENING:        BREAST COMPLAINTS SURVEY:(EVAL) BREAST COMPLAINTS QUESTIONNAIRE Pain:2 Heaviness:7 Swollen feeling:9 Tense Skin:1 Redness:6 Bra Print:9 Size of Pores:10 Hard feeling: 8 Total:   52  /80 A Score over 9 indicates lymphedema issues in the breast  BREAST COMPLAINTS QUESTIONNAIRE(10/11/2024) Pain:0 Heaviness:1 Swollen feeling:7 Tense Skin:2 Redness:5 Bra Print:9 Size of Pores:8 Hard feeling: 5 Total:   37  /80 A Score over 9 indicates lymphedema issues in the breast                                                                                                                            TREATMENT DATE:   11/24/025 Discussed Flexi touch trial and DN. Pt wants to wait until next week before deciding about Flexi touch and dry needling to see how her shoulder injections done last week do. Pt initiated MLD as below and performed all steps. Showed pt how to work in left SL for right axillo-inguinal pathway.Therapist then continued and performed all steps on pt. In supine: Short neck, 5 diaphragmatic breaths, L and right  axillary nodes and establishment of interaxillary pathway, R inguinal nodes and establishment of axilloinguinal pathway, then R breast moving fluid towards pathways spending extra time in any areas of fibrosis then retracing all steps and ending with axillary and right inguinal LN's Therapist made a small chip pack to place at medial breast area of swelling and mild fibrosis  09/28/2024 Measured chest circumferences over compression bra Therapist  performed first and then pt return demonstrated all steps for Right breast MLD. Made minimal modification to pts performance, however, she did better using her left hand for anterior interaxillary pathway. In supine: Short neck, 5 diaphragmatic breaths, L and right  axillary nodes and  establishment of interaxillary pathway, R inguinal nodes and establishment of axilloinguinal pathway, then R breast moving fluid towards pathways spending extra time in any areas of fibrosis then retracing all steps and ending with axillary and right inguinal LN's. Did SOZO screen. Well in the green and had pt set up next one Expressed concern to pt that per what I can see she has had 20 visits this year for her therapy. Insurance had told her otherwise. Advised her to reach out again just to be sure. Discussed Flexi touch and gave Flyer. She would like for me to send her demographics to have them checked since she has met OOP. 09/22/2024 Reminded pt about the superficial nature of lymphatics and the benefit of stretch, not slide. Therapist read handout to pt and demonstrated steps to pt, and had pt return demonstrate all as below. Therapist then performed MLD to medial and lateral breast and pathways as below, ending with LN's In supine: Short neck, 5 diaphragmatic breaths, Land right  axillary nodes and establishment of interaxillary pathway, R inguinal nodes and establishment of axilloinguinal pathway, then R breast moving fluid towards pathways spending extra time in any areas of fibrosis then retracing all steps and ending with axillary and right inguinal LN's. Written and illustrated handout given to pt.   09/14/2024 Discussed POC, LOS, treatment interventions. At end of treatment pt questioned if she could have DN to her lateral arm. Burnard said to add to POC. Started instructing pt in MLD to the right breast. Explained purpose of LN's and pathways and showed wall poster for superficial nature of lymphatics.. In  supine: Short neck, 5 diaphragmatic breaths, L axillary nodes and establishment of interaxillary pathway, R inguinal nodes and establishment of axilloinguinal pathway, then R breast moving fluid towards pathways spending extra time in any areas of fibrosis then retracing all steps. Explained techniques and gentleness while performing. Did not give handout.     PATIENT EDUCATION:  Education details: POC, LOS,treatment interventions,breast MLD Person educated: Patient Education method: Medical Illustrator Education comprehension: verbalized understanding  HOME EXERCISE PROGRAM:   ASSESSMENT:  CLINICAL IMPRESSION:  Pts swelling greatly improved but enlarged pores still noted. She has been compliant with MLD. Small chip pack made for medial breast and given TG soft to place chip pack in prn for comfort. Bra print is noted at medial breast but pt is not wearing her compression bra. Breast complaints questionnaire improved by 15 points.   OBJECTIVE IMPAIRMENTS: decreased knowledge of condition, decreased ROM, increased edema, impaired UE functional use, postural dysfunction, and pain.   ACTIVITY LIMITATIONS: reach over head  PARTICIPATION LIMITATIONS: able to do most activities without limitation  PERSONAL FACTORS: 1-2 comorbidities: Right breast cancer with SLNB, radiation are also affecting patient's functional outcome.   REHAB POTENTIAL: Good  CLINICAL DECISION MAKING: Stable/uncomplicated  EVALUATION COMPLEXITY: Low  GOALS: Goals reviewed with patient? Yes  SHORT TERM GOALS=LONG TERM GOALS: Target date: 10/26/2024  Pt will be compliant with compression bra wear to decrease breast swelling Baseline: Goal status: MET  2.  Pt will be independent in self MLD to decrease Right breast swelling Baseline:  Goal status: INITIAL  3.  Pt will report breast swelling improved by 25-50% overall  Baseline:  Goal status: MET  10/11/2024  4.  Pts breast complaints  questionaire will be reduced by 15-20 points to demonstrate overall improvement  Baseline:  Goal status: INITIAL     PLAN:  PT FREQUENCY: 1x/week  PT DURATION: 6 weeks  PLANNED  INTERVENTIONS:  ADL/Self care home management, [phone]- PT Re-evaluation, 97110-Therapeutic exercises, 97530- Therapeutic activity, [id number]- Neuromuscular re-education, 97535- Self Care, [id number]- Manual therapy, 97760- Orthotic Initial, and [id number]- Orthotic/Prosthetic subsequent, Dry Needling  PLAN FOR NEXT SESSION: Did she try chip pack medial breast?continue reviewing MLD and have pt continue practicing. Chip pack? Limited visits left with insurance, pt may want DN for her right shoulder  Grayce JINNY Sheldon, PT 10/11/2024, 2:53 PM

## 2024-10-19 ENCOUNTER — Other Ambulatory Visit

## 2024-10-19 DIAGNOSIS — E611 Iron deficiency: Secondary | ICD-10-CM

## 2024-10-20 LAB — IRON,TIBC AND FERRITIN PANEL
Ferritin: 33 ng/mL (ref 15–150)
Iron Saturation: 15 % (ref 15–55)
Iron: 66 ug/dL (ref 27–159)
Total Iron Binding Capacity: 428 ug/dL (ref 250–450)
UIBC: 362 ug/dL (ref 131–425)

## 2024-10-20 LAB — CBC WITH DIFFERENTIAL/PLATELET
Basophils Absolute: 0 x10E3/uL (ref 0.0–0.2)
Basos: 0 %
EOS (ABSOLUTE): 0.1 x10E3/uL (ref 0.0–0.4)
Eos: 1 %
Hematocrit: 39.5 % (ref 34.0–46.6)
Hemoglobin: 12.8 g/dL (ref 11.1–15.9)
Immature Grans (Abs): 0.1 x10E3/uL (ref 0.0–0.1)
Immature Granulocytes: 1 %
Lymphocytes Absolute: 2 x10E3/uL (ref 0.7–3.1)
Lymphs: 18 %
MCH: 28.7 pg (ref 26.6–33.0)
MCHC: 32.4 g/dL (ref 31.5–35.7)
MCV: 89 fL (ref 79–97)
Monocytes Absolute: 0.6 x10E3/uL (ref 0.1–0.9)
Monocytes: 6 %
Neutrophils Absolute: 8.6 x10E3/uL — ABNORMAL HIGH (ref 1.4–7.0)
Neutrophils: 74 %
Platelets: 412 x10E3/uL (ref 150–450)
RBC: 4.46 x10E6/uL (ref 3.77–5.28)
RDW: 15.1 % (ref 11.7–15.4)
WBC: 11.4 x10E3/uL — ABNORMAL HIGH (ref 3.4–10.8)

## 2024-10-27 ENCOUNTER — Ambulatory Visit: Payer: Self-pay | Admitting: Nurse Practitioner

## 2024-10-28 ENCOUNTER — Ambulatory Visit

## 2024-11-04 ENCOUNTER — Inpatient Hospital Stay: Attending: Hematology and Oncology | Admitting: Licensed Clinical Social Worker

## 2024-11-04 NOTE — Progress Notes (Signed)
 [facility] CSW Counseling Note  Patient was referred by self. Treatment type: Individual  Presenting Concerns: Patient and/or family reports the following symptoms/concerns: pain; adjustment to cancer Duration of problem: 1 month; Severity of problem: moderate   Orientation:oriented to person, place, time/date, and situation.   Affect: Appropriate and Congruent Risk of harm to self or others: No plan to harm self or others  Patient and/or Family's Strengths/Protective Factors: Social connections, Social and Emotional competence, and Concrete supports in place (healthy food, safe environments, etc.)Ability for insight  Capable of independent living  Communication skills  Motivation for treatment/growth  Supportive family/friends      Goals Addressed: Patient will:  Increase ability to cope with pain and uncertainty with cancer treatments and recovery   Progress towards Goals: Met   Interventions: Interventions utilized:  CBT      Assessment: Patient continues to do well with coping since last visit. Pt is able to recognize triggers for cancer-related fears (blood work, imaging) and has tools and strategies in place to manage those.  Otherwise, cancer worries do not interfere.  Pt is having some trouble sleeping recently, possibly related to hormone fluctuation or an illness she is dealing with.  CSW reviewed sleep hygiene and encouraged pt to reach out if sleep does not improve.    Plan: Follow up with CSW: PRN Behavioral recommendations: Continue to utilize coping skills and strategies for trigger times. Continue to make choices about how large of a role cancer will play in our life in areas such as applications  Referral(s): Support group.  FYNN class September-October. HEAL-ABC study       Damiana Berrian E Maily Debarge, LCSW

## 2024-11-05 ENCOUNTER — Telehealth: Payer: Self-pay

## 2024-11-05 DIAGNOSIS — Z170 Estrogen receptor positive status [ER+]: Secondary | ICD-10-CM

## 2024-11-05 NOTE — Telephone Encounter (Signed)
 [id number]: Harnessing E-Mindfulness Approaches for Living--After Breast Cancer (HEAL-ABC)   This CRC attempted to contact Ariel Ayala  by phone regarding the above study. Patient was not available. A voice message was left with the call back number.  Abelardo Jock Clinical Research Coordinator [phone] 11/05/2024 2:01 PM

## 2024-11-07 ENCOUNTER — Encounter: Payer: Self-pay | Admitting: Nurse Practitioner

## 2024-11-07 DIAGNOSIS — T753XXA Motion sickness, initial encounter: Secondary | ICD-10-CM

## 2024-11-08 ENCOUNTER — Inpatient Hospital Stay: Admission: RE | Admit: 2024-11-08 | Discharge: 2024-11-08 | Attending: Adult Health | Admitting: Adult Health

## 2024-11-08 DIAGNOSIS — Z170 Estrogen receptor positive status [ER+]: Secondary | ICD-10-CM

## 2024-11-08 MED ORDER — SCOPOLAMINE 1 MG/3DAYS TD PT72: 1.0000 | MEDICATED_PATCH | TRANSDERMAL | 0 refills | Status: AC

## 2024-11-16 ENCOUNTER — Ambulatory Visit: Attending: General Surgery

## 2024-11-16 DIAGNOSIS — Z483 Aftercare following surgery for neoplasm: Secondary | ICD-10-CM | POA: Diagnosis present

## 2024-11-16 DIAGNOSIS — Z170 Estrogen receptor positive status [ER+]: Secondary | ICD-10-CM | POA: Diagnosis present

## 2024-11-16 DIAGNOSIS — N63 Unspecified lump in unspecified breast: Secondary | ICD-10-CM | POA: Insufficient documentation

## 2024-11-16 DIAGNOSIS — I890 Lymphedema, not elsewhere classified: Secondary | ICD-10-CM | POA: Insufficient documentation

## 2024-11-16 DIAGNOSIS — C50411 Malignant neoplasm of upper-outer quadrant of right female breast: Secondary | ICD-10-CM | POA: Diagnosis present

## 2024-11-16 NOTE — Therapy (Signed)
 " OUTPATIENT PHYSICAL THERAPY  UPPER EXTREMITY ONCOLOGY TREATMENT  Patient Name: Ariel Ayala MRN: [id number] DOB:Dec 25, 1975, 48 y.o., female Today's Date: 11/16/2024  END OF SESSION:  PT End of Session - 11/16/24 0902     Visit Number 5    Number of Visits 6    Date for Recertification  11/16/24    Authorization Type [employer]    PT Start Time 9096    PT Stop Time 0954    PT Time Calculation (min) 51 min    Activity Tolerance Patient tolerated treatment well    Behavior During Therapy [facility] for tasks assessed/performed          Past Medical History:  Diagnosis Date   Asthma    as a child   Bloating 10/23/2020   CMV (cytomegalovirus infection) (HCC)    CMV (cytomegalovirus infection) (HCC)    Community acquired pneumonia 01/06/2024   Early satiety 10/23/2020   Family history of thyroid disease in mother 05/09/2017   Generalized abdominal pain 10/23/2020   Hair loss 03/12/2023   Impingement syndrome of left shoulder region 09/10/2023   Indigestion 10/23/2020   Invasive ductal carcinoma of breast, female, 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   Pain in joint of left shoulder 09/10/2023   Splenomegaly    on CT   Tenosynovitis of right hand 01/29/2023   Vitamin D  deficiency 10/25/2020   Past Surgical History:  Procedure Laterality Date   BREAST BIOPSY Right 11/28/2023   MM RT BREAST BX W LOC DEV 1ST LESION IMAGE BX SPEC STEREO GUIDE 11/28/2023 [facility]   BREAST BIOPSY  12/23/2023   MM RT RADIOACTIVE SEED LOC MAMMO GUIDE 12/23/2023 [facility]   BREAST LUMPECTOMY WITH RADIOACTIVE SEED AND SENTINEL LYMPH NODE BIOPSY Right 12/24/2023   Procedure: RIGHT BREAST LUMPECTOMY WITH RADIOACTIVE SEED AND SENTINEL LYMPH NODE BIOPSY;  Surgeon: Vernetta Berg, MD;  Location: [facility] OR;  Service: General;  Laterality: Right;   DILATION AND CURETTAGE OF UTERUS     FOOT SURGERY Right    with hardware   trigger thumb Right    Patient Active Problem List   Diagnosis Date Noted   Acute  non-recurrent pansinusitis 05/12/2024   Non-recurrent acute suppurative otitis media of both ears without spontaneous rupture of tympanic membranes 05/12/2024   Screening for colon cancer 03/09/2024   Genetic testing 12/22/2023   Malignant neoplasm of upper-outer quadrant of right breast in female, estrogen receptor positive (HCC) 12/04/2023   Adenomyosis of uterus 08/01/2023   Iron  deficiency 07/02/2023   Mixed hyperlipidemia 07/02/2023   Perimenopause 03/12/2023   Osteoarthritis of carpometacarpal (CMC) joint of thumb 01/29/2023   Adhesive capsulitis of right shoulder 08/01/2022   Body mass index (BMI) of 26.0-26.9 in adult 04/02/2022   Anxiety 04/01/2022   Primary insomnia 04/01/2022   Abnormal cervical Papanicolaou smear 11/06/2021   Genital herpes simplex 03/13/2021   Vitamin D  deficiency 10/25/2020   Vegetarian diet 10/23/2020   Encounter for annual physical exam 10/23/2020   TMJ tenderness, bilateral 02/11/2020   Seasonal allergies 02/11/2020   Elevated TSH 05/09/2017   Splenomegaly 04/16/2017   Asthma 11/12/2016    PCP:   REFERRING PROVIDER:Paul Curvin, MD  REFERRING DIAG: Right breast swelling  THERAPY DIAG:  Lymphedema, not elsewhere classified  Breast swelling  Malignant neoplasm of upper-outer quadrant of right breast in female, estrogen receptor positive [facility])  Aftercare following surgery for neoplasm  ONSET DATE: 08/30/2024  Rationale for Evaluation and Treatment: Rehabilitation  SUBJECTIVE:  SUBJECTIVE STATEMENT:  I am feeling good. Shoulders still doing well since injections. Right still a little nagging. My breast is still a little swollen.  I had my mammogram last week and that was good. I have slacked a little over the holidays with the MLD. I feel like this can be my  last visit. [facility] [employer] [facility]. The blanching of the breast occurs maybe 1x/week to every other week, but doesn't linger long. I didn't feel like I needed or wanted the flexi touch right now.   EVAL Pt with right breast pain and swelling present since 10/12/025. Was placed on doxycycline  and later seen in ED on 09/01/2024 because she felt it was worse.Her areola periodically becomes hard and white. No improvement with Doxycycline  and was changed to Augmentin . She had Diagnostic Mammogram no findings suspicious for malignancy and US  noted a hypoechoic area that was not felt to be fluid, but possibly a resolving abscess.  [facility] if anyone had concerns about inflammatory breast cancer because she had peau'dorange under the breast. The antibiotics have helped. I don't really have pain any more, and the swelling has reduced. My nipple is a little red and the skin around it is crusty. I started wearing the compression bra over the weekend and I think that has helped the swelling as well.  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 It measures 9 mm and is located in the upper outer quadrant. It is ER/PR positive and HER2 negative with a Ki67 of 5%. Rt lumpectomy and SLNB on 12/24/23 with 3 negative nodes removed. Had radiation 01/29/2024-02/25/2024. Right shoulder adhesive capsulitis diagnosed in mid 2023.    PAIN:  Are you having pain? No, mild tenderness. Much better since 2 weeks ago  PRECAUTIONS: right UE lymphedema risk  RED FLAGS: None   WEIGHT BEARING RESTRICTIONS: No  FALLS:  Has patient fallen in last 6 months? No  LIVING ENVIRONMENT: Lives with: lives with their daughter Lives in: House/apartment   OCCUPATION: market research  LEISURE: out with friends, watch soccer,  HAND DOMINANCE: right   PRIOR LEVEL OF FUNCTION: Independent  PATIENT GOALS: To get  swelling down, learn MLD   OBJECTIVE: Note: Objective measures were completed at Evaluation unless otherwise noted.  COGNITION: Overall cognitive status: Within functional limits for tasks assessed   PALPATION: Tender at axilllary incision, and right axillary border of pectorals  OBSERVATIONS / OTHER ASSESSMENTS: mildly enlarged pores medial and inferior right breast with mild peau'dorange, firmness noted lateral/inferior breast. Mild redness still present since radiation. Areola with good coloration presently and minimal swelling presently, but at times areola gets hard and blanches 2-3 times/week. Nipple appears slightly irritated  SENSATION: Light touch: Appears intact  POSTURE: forward head, rounded shoulders  UPPER EXTREMITY AROM/PROM:  A/PROM RIGHT   eval  RIGHT 11/16/2024  Shoulder extension    Shoulder flexion 139 142  Shoulder abduction 120 138  Shoulder internal rotation    Shoulder external rotation      (Blank rows = not tested)  A/PROM LEFT   eval  Shoulder extension   Shoulder flexion 160  Shoulder abduction 137  Shoulder internal rotation   Shoulder external rotation     (Blank rows = not tested)  CERVICAL AROM: All within normal limits:      UPPER EXTREMITY STRENGTH:   LYMPHEDEMA ASSESSMENTS:   SURGERY TYPE/DATE: Rt lumpectomy and  SLNB on 12/24/23  NUMBER OF LYMPH NODES REMOVED: 0+/3  CHEMOTHERAPY: NO  RADIATION:YES  HORMONE TREATMENT: YES  INFECTIONS: Yes, presently on antibiotics   LYMPHEDEMA ASSESSMENTS:   LANDMARK RIGHT  eval  At axilla    15 cm proximal to the proximal aspect of the olecranon process   10 cm proximal to the proximal aspect of the olecranon process   Olecranon process   15 cm proximal to the proximal aspect of the ulnar styloid process   10 cm proximal to the proximal aspect of the ulnar styloid process   Just distal to the ulnar styloid process   Across hand at thumb web space   At base of 2nd digit    (Blank rows = not tested)  LANDMARK LEFT  eval  At axilla    15 cm proximal to the proximal aspect of the olecranon process   10 cm proximal to the proximal aspect of the olecranon process   Olecranon process   15 cm proximal to the proximal aspect of the ulnar styloid process   10 cm proximal to  the proximal aspect of the ulnar styloid process   Just distal to the ulnar styloid process   Across hand at thumb web space   At base of 2nd digit   (Blank rows = not tested)  Chest circumference just inferior to the axillae: 92.5 Chest circumference at the largest point:  99 cm     GAIT:  L-DEX LYMPHEDEMA SCREENING:        BREAST COMPLAINTS SURVEY:(EVAL) BREAST COMPLAINTS QUESTIONNAIRE Pain:2 Heaviness:7 Swollen feeling:9 Tense Skin:1 Redness:6 Bra Print:9 Size of Pores:10 Hard feeling: 8 Total:   52  /80 A Score over 9 indicates lymphedema issues in the breast  BREAST COMPLAINTS QUESTIONNAIRE(10/11/2024) Pain:0 Heaviness:1 Swollen feeling:7 Tense Skin:2 Redness:5 Bra Print:9 Size of Pores:8 Hard feeling: 5 Total:   37  /80 A Score over 9 indicates lymphedema issues in the breast                                                                                                                            TREATMENT DATE:  11/16/2024 Reviewed current status Reviewed right breast MLD techniques: In supine: Short neck, 5 diaphragmatic breaths, L and right  axillary nodes and establishment of interaxillary pathway, R inguinal nodes and establishment of axilloinguinal pathway, then R breast moving fluid towards pathways spending extra time in any areas of fibrosis then retracing all steps and ending with axillary and right inguinal LN's. Reviewed all steps of MLD to reinforce things for pt and pt physically reviewed each step. Mildly enlarged pores remain at medial breast  11/24/025 Discussed Flexi touch trial and DN. Pt wants to wait until next week before deciding  about Flexi touch and dry needling to see how her shoulder injections done last week do. Pt initiated MLD as below and performed all steps. Showed pt how to work in left SL for right axillo-inguinal pathway.Therapist  then continued and performed all steps on pt. In supine: Short neck, 5 diaphragmatic breaths, L and right  axillary nodes and establishment of interaxillary pathway, R inguinal nodes and establishment of axilloinguinal pathway, then R breast moving fluid towards pathways spending extra time in any areas of fibrosis then retracing all steps and ending with axillary and right inguinal LN's Therapist made a small chip pack to place at medial breast area of swelling and mild fibrosis  09/28/2024 Measured chest circumferences over compression bra Therapist performed first and then pt return demonstrated all steps for Right breast MLD. Made minimal modification to pts performance, however, she did better using her left hand for anterior interaxillary pathway. In supine: Short neck, 5 diaphragmatic breaths, L and right  axillary nodes and establishment of interaxillary pathway, R inguinal nodes and establishment of axilloinguinal pathway, then R breast moving fluid towards pathways spending extra time in any areas of fibrosis then retracing all steps and ending with axillary and right inguinal LN's. Did SOZO screen. Well in the green and had pt set up next one Expressed concern to pt that per what I can see she has had 20 visits this year for her therapy. Insurance had told her otherwise. Advised her to reach out again just to be sure. Discussed Flexi touch and gave Flyer. She would like for me to send her demographics to have them checked since she has met OOP. 09/22/2024 Reminded pt about the superficial nature of lymphatics and the benefit of stretch, not slide. Therapist read handout to pt and demonstrated steps to pt, and had pt return demonstrate all as below. Therapist then performed MLD to  medial and lateral breast and pathways as below, ending with LN's In supine: Short neck, 5 diaphragmatic breaths, Land right  axillary nodes and establishment of interaxillary pathway, R inguinal nodes and establishment of axilloinguinal pathway, then R breast moving fluid towards pathways spending extra time in any areas of fibrosis then retracing all steps and ending with axillary and right inguinal LN's. Written and illustrated handout given to pt.   09/14/2024 Discussed POC, LOS, treatment interventions. At end of treatment pt questioned if she could have DN to her lateral arm. Burnard said to add to POC. Started instructing pt in MLD to the right breast. Explained purpose of LN's and pathways and showed wall poster for superficial nature of lymphatics.. In supine: Short neck, 5 diaphragmatic breaths, L axillary nodes and establishment of interaxillary pathway, R inguinal nodes and establishment of axilloinguinal pathway, then R breast moving fluid towards pathways spending extra time in any areas of fibrosis then retracing all steps. Explained techniques and gentleness while performing. Did not give handout.     PATIENT EDUCATION:  Education details: POC, LOS,treatment interventions,breast MLD Person educated: Patient Education method: Medical Illustrator Education comprehension: verbalized understanding  HOME EXERCISE PROGRAM:   ASSESSMENT:  CLINICAL IMPRESSION: Pt has achieved all goals established.  Swelling is improved by greater than 50%, and pt is independent with her compression bra and self MLD.She is going to start [employer] in January. She feels ready to be discharged to a HEP   OBJECTIVE IMPAIRMENTS: decreased knowledge of condition, decreased ROM, increased edema, impaired UE functional use, postural dysfunction, and pain.   ACTIVITY LIMITATIONS: reach over head  PARTICIPATION LIMITATIONS: able to do most activities without limitation  PERSONAL FACTORS:  1-2 comorbidities: Right breast cancer with SLNB, radiation are also affecting patient's functional outcome.   REHAB POTENTIAL: Good  CLINICAL DECISION MAKING:  Stable/uncomplicated  EVALUATION COMPLEXITY: Low  GOALS: Goals reviewed with patient? Yes  SHORT TERM GOALS=LONG TERM GOALS: Target date: 10/26/2024  Pt will be compliant with compression bra wear to decrease breast swelling Baseline: Goal status: MET  2.  Pt will be independent in self MLD to decrease Right breast swelling Baseline:  Goal status: MET  3.  Pt will report breast swelling improved by 25-50% overall  Baseline:  Goal status: MET  10/11/2024  4.  Pts breast complaints questionaire will be reduced by 15-20 points to demonstrate overall improvement  Baseline:  Goal status: MET    PLAN:  PT FREQUENCY: 1x/week  PT DURATION: 6 weeks  PLANNED INTERVENTIONS:  ADL/Self care home management, [phone]- PT Re-evaluation, 97110-Therapeutic exercises, 97530- Therapeutic activity, 97112- Neuromuscular re-education, 97535- Self Care, [id number]- Manual therapy, 97760- Orthotic Initial, and [id number]- Orthotic/Prosthetic subsequent, Dry Needling  PLAN FOR NEXT SESSION: Pt is discharged to her HEP/MLD. She will continue her SOZO screens PHYSICAL THERAPY DISCHARGE SUMMARY  Visits from Start of Care: 5  Current functional level related to goals / functional outcomes: Achieved goals   Remaining deficits: Mild right medial breast swelling remains   Education / Equipment: HEP/MLD   Patient agrees to discharge. Patient goals were met. Patient is being discharged due to meeting the stated rehab goals.     Grayce JINNY Sheldon, PT 11/16/2024, 9:57 AM  "

## 2024-11-24 ENCOUNTER — Encounter: Payer: Self-pay | Admitting: Nurse Practitioner

## 2024-11-24 ENCOUNTER — Telehealth: Admitting: Nurse Practitioner

## 2024-11-24 DIAGNOSIS — L03114 Cellulitis of left upper limb: Secondary | ICD-10-CM | POA: Diagnosis not present

## 2024-11-24 MED ORDER — PREDNISONE 20 MG PO TABS: 20.0000 mg | ORAL_TABLET | Freq: Every day | ORAL | 0 refills | Status: AC

## 2024-11-24 MED ORDER — AMOXICILLIN-POT CLAVULANATE 400-57 MG/5ML PO SUSR: 875.0000 mg | Freq: Two times a day (BID) | ORAL | 0 refills | Status: AC

## 2024-11-24 MED ORDER — MOMETASONE FUROATE 0.1 % EX CREA: TOPICAL_CREAM | CUTANEOUS | 1 refills | Status: AC

## 2024-11-24 NOTE — Progress Notes (Signed)
 Virtual Visit Encounter mychart visit.   I connected with  Kirke CROME Borenstein on 11/24/2024 at 10:45 AM EST by secure video and audio telemedicine application. I verified that I am speaking with the correct person using two identifiers.   I introduced myself as a Publishing Rights Manager with the practice. The limitations of evaluation and management by telemedicine discussed with the patient and the availability of in person appointments. The patient expressed verbal understanding and consent to proceed.  Participating parties in this visit include: Myself and patient  The patient is: Patient Location: Home I am: Virtual Visit Location Provider: Office/Clinic  Subjective:    CC and HPI:   History of Present Illness Ariel Ayala is a 49 year old female who presents with a suspected spider bite on her left arm.  She noticed a bite on her left arm on Monday night before midnight. Initially, she felt a bump and scratched it, then went to sleep. Upon waking, the area was swollen, red, and hot. She drew a line around the swelling to monitor its size, noting that it has not expanded significantly beyond the line.  The bite area is described as hard and hot, with two small bite marks that have been scratched. Swelling extends to her pinky finger, which is also swollen, particularly at the bottom knuckle, though no bite mark is visible there. The area itches slightly, and she has been using Benadryl spray and ice to manage symptoms.  She has a history of allergies to sulfa and Macrobid, and reports that antibiotics generally upset her stomach. She has been taking Benadryl and Aleve for symptom relief. She also mentions having Diflucan  available due to a history of yeast infections when on antibiotics.  No drainage from the bite area, noting only a small bubble of clear fluid when scratched. She is currently taking Claritin regularly.  Past medical history, Surgical history, Family history not pertinant  except as noted below, Social history, Allergies, and medications have been entered into the medical record, reviewed, and corrections made.   Review of Systems:  All review of systems negative except what is listed in the HPI  Objective:    Alert and oriented x 4 Speaking in clear sentences with no shortness of breath. No distress. Erythema and inflammation noted to the left elbow with distinct perimeter. Two small raised areas, consistent with bite. No signs of drainage.   Impression and Recommendations:    Assessment & Plan Cellulitis of arm, left Cellulitis of left upper limb Cellulitis of the left upper limb with swelling, erythema, and induration. No drainage present. Allergic to sulfa and Macrobid, with gastrointestinal upset from antibiotics. No signs of systemic infection. Tangi Shroff intervention with antibiotics is crucial to prevent progression to infection. - Prescribed Augmentin  suspension twice daily for five days. - Prescribed mometasone  cream for itching, apply twice daily. - Advised use of ice packs and avoidance of scratching to prevent bacterial introduction. - Recommended acidophilus to mitigate gastrointestinal upset from antibiotics. - Prescribed prednisone  for use if inflammation does not improve in 24 hours. - Instructed to monitor for improvement by Friday and report if not improving or worsening. Orders:   amoxicillin -clavulanate (AUGMENTIN ) 400-57 MG/5ML suspension; Take 10.9 mLs (872 mg total) by mouth 2 (two) times daily.   mometasone  (ELOCON ) 0.1 % cream; Apply to the area of itching twice a day.   predniSONE  (DELTASONE ) 20 MG tablet; Take 1 tablet (20 mg total) by mouth daily with breakfast.    orders and follow  up as documented in EMR I discussed the assessment and treatment plan with the patient. The patient was provided an opportunity to ask questions and all were answered. The patient agreed with the plan and demonstrated an understanding of the  instructions.   The patient was advised to call back or seek an in-person evaluation if the symptoms worsen or if the condition fails to improve as anticipated.  Follow-Up: prn  I provided 21 minutes of non-face-to-face interaction with this non face-to-face encounter including intake, same-day documentation, and chart review.   Camie CHARLENA Doing, NP , DNP, AGNP-c [facility]

## 2024-11-26 ENCOUNTER — Telehealth: Payer: Self-pay

## 2024-11-26 DIAGNOSIS — Z170 Estrogen receptor positive status [ER+]: Secondary | ICD-10-CM

## 2024-11-26 NOTE — Telephone Encounter (Signed)
 [id number]: Harnessing E-Mindfulness Approaches for Living--After Breast Cancer (HEAL-ABC)    Patient Ariel Ayala was identified by Dr. Loretha as a potential candidate for the above listed study.  This CRC attempted to contact [name] by phone regarding the above study. Patient was not available. A voice message was left with the call back number.  Abelardo Jock Clinical Research Coordinator [phone] 11/26/2024 3:03 PM

## 2024-11-29 ENCOUNTER — Encounter: Payer: Self-pay | Admitting: Nurse Practitioner

## 2024-11-29 NOTE — Patient Instructions (Signed)

## 2024-12-27 ENCOUNTER — Ambulatory Visit: Payer: Self-pay

## 2025-01-14 ENCOUNTER — Ambulatory Visit: Admitting: Hematology and Oncology

## 2025-03-29 ENCOUNTER — Encounter: Payer: Self-pay | Admitting: Nurse Practitioner

## 2025-08-03 ENCOUNTER — Other Ambulatory Visit
